# Patient Record
Sex: Female | Born: 1935
Health system: Southern US, Community
[De-identification: ages and names within clinical notes are randomized; demographics above are authoritative.]

## PROBLEM LIST (undated history)

## (undated) DIAGNOSIS — M48061 Spinal stenosis, lumbar region without neurogenic claudication: Secondary | ICD-10-CM

## (undated) DIAGNOSIS — M8580 Other specified disorders of bone density and structure, unspecified site: Secondary | ICD-10-CM

## (undated) DIAGNOSIS — Z8781 Personal history of (healed) traumatic fracture: Secondary | ICD-10-CM

## (undated) DIAGNOSIS — S022XXA Fracture of nasal bones, initial encounter for closed fracture: Secondary | ICD-10-CM

## (undated) DIAGNOSIS — I10 Essential (primary) hypertension: Secondary | ICD-10-CM

## (undated) DIAGNOSIS — G5601 Carpal tunnel syndrome, right upper limb: Secondary | ICD-10-CM

## (undated) DIAGNOSIS — E039 Hypothyroidism, unspecified: Secondary | ICD-10-CM

## (undated) DIAGNOSIS — J3089 Other allergic rhinitis: Secondary | ICD-10-CM

## (undated) DIAGNOSIS — R350 Frequency of micturition: Secondary | ICD-10-CM

## (undated) DIAGNOSIS — H9190 Unspecified hearing loss, unspecified ear: Secondary | ICD-10-CM

## (undated) DIAGNOSIS — S0003XA Contusion of scalp, initial encounter: Secondary | ICD-10-CM

## (undated) DIAGNOSIS — S32000A Wedge compression fracture of unspecified lumbar vertebra, initial encounter for closed fracture: Secondary | ICD-10-CM

## (undated) DIAGNOSIS — M1990 Unspecified osteoarthritis, unspecified site: Secondary | ICD-10-CM

## (undated) DIAGNOSIS — M5412 Radiculopathy, cervical region: Secondary | ICD-10-CM

## (undated) DIAGNOSIS — Z8709 Personal history of other diseases of the respiratory system: Secondary | ICD-10-CM

## (undated) DIAGNOSIS — Z8679 Personal history of other diseases of the circulatory system: Secondary | ICD-10-CM

## (undated) HISTORY — PX: COLONOSCOPY: SHX174

---

## 2001-02-07 ENCOUNTER — Encounter: Admission: RE | Admit: 2001-02-07 | Discharge: 2001-02-07 | Payer: Self-pay | Admitting: Family Medicine

## 2001-02-07 ENCOUNTER — Encounter: Payer: Self-pay | Admitting: Family Medicine

## 2002-07-28 ENCOUNTER — Emergency Department (HOSPITAL_COMMUNITY): Admission: EM | Admit: 2002-07-28 | Discharge: 2002-07-29 | Payer: Self-pay | Admitting: *Deleted

## 2003-01-14 ENCOUNTER — Ambulatory Visit (HOSPITAL_COMMUNITY): Admission: RE | Admit: 2003-01-14 | Discharge: 2003-01-14 | Payer: Self-pay | Admitting: Family Medicine

## 2003-01-14 ENCOUNTER — Encounter: Payer: Self-pay | Admitting: Family Medicine

## 2003-03-13 ENCOUNTER — Emergency Department (HOSPITAL_COMMUNITY): Admission: EM | Admit: 2003-03-13 | Discharge: 2003-03-14 | Payer: Self-pay | Admitting: Emergency Medicine

## 2005-03-26 ENCOUNTER — Emergency Department (HOSPITAL_COMMUNITY): Admission: EM | Admit: 2005-03-26 | Discharge: 2005-03-26 | Payer: Self-pay | Admitting: Emergency Medicine

## 2007-09-06 ENCOUNTER — Ambulatory Visit: Payer: Self-pay | Admitting: *Deleted

## 2007-09-06 ENCOUNTER — Encounter (INDEPENDENT_AMBULATORY_CARE_PROVIDER_SITE_OTHER): Payer: Self-pay | Admitting: Family Medicine

## 2007-09-06 ENCOUNTER — Ambulatory Visit (HOSPITAL_COMMUNITY): Admission: RE | Admit: 2007-09-06 | Discharge: 2007-09-06 | Payer: Self-pay | Admitting: Family Medicine

## 2009-03-07 ENCOUNTER — Emergency Department (HOSPITAL_COMMUNITY): Admission: EM | Admit: 2009-03-07 | Discharge: 2009-03-07 | Payer: Self-pay | Admitting: Emergency Medicine

## 2015-01-15 DIAGNOSIS — I10 Essential (primary) hypertension: Secondary | ICD-10-CM | POA: Diagnosis not present

## 2015-01-15 DIAGNOSIS — Z23 Encounter for immunization: Secondary | ICD-10-CM | POA: Diagnosis not present

## 2015-01-15 DIAGNOSIS — J309 Allergic rhinitis, unspecified: Secondary | ICD-10-CM | POA: Diagnosis not present

## 2015-01-15 DIAGNOSIS — E119 Type 2 diabetes mellitus without complications: Secondary | ICD-10-CM | POA: Diagnosis not present

## 2015-01-15 DIAGNOSIS — E039 Hypothyroidism, unspecified: Secondary | ICD-10-CM | POA: Diagnosis not present

## 2015-01-15 DIAGNOSIS — H409 Unspecified glaucoma: Secondary | ICD-10-CM | POA: Diagnosis not present

## 2015-03-05 DIAGNOSIS — H402231 Chronic angle-closure glaucoma, bilateral, mild stage: Secondary | ICD-10-CM | POA: Diagnosis not present

## 2015-03-12 DIAGNOSIS — H402231 Chronic angle-closure glaucoma, bilateral, mild stage: Secondary | ICD-10-CM | POA: Diagnosis not present

## 2015-04-11 ENCOUNTER — Encounter (HOSPITAL_COMMUNITY): Payer: Self-pay

## 2015-04-11 DIAGNOSIS — I10 Essential (primary) hypertension: Secondary | ICD-10-CM | POA: Insufficient documentation

## 2015-04-11 DIAGNOSIS — L237 Allergic contact dermatitis due to plants, except food: Secondary | ICD-10-CM | POA: Insufficient documentation

## 2015-04-11 DIAGNOSIS — Z8639 Personal history of other endocrine, nutritional and metabolic disease: Secondary | ICD-10-CM | POA: Insufficient documentation

## 2015-04-11 DIAGNOSIS — L03114 Cellulitis of left upper limb: Secondary | ICD-10-CM | POA: Insufficient documentation

## 2015-04-11 NOTE — ED Notes (Signed)
Pt was working in yard Friday and now has poison oak or poison ivy on her left elbow, swelling and rednedss noted.

## 2015-04-12 ENCOUNTER — Emergency Department (HOSPITAL_COMMUNITY)
Admission: EM | Admit: 2015-04-12 | Discharge: 2015-04-12 | Disposition: A | Discharge status: Home / Self Care | Payer: Commercial Managed Care - HMO | Attending: Emergency Medicine | Admitting: Emergency Medicine

## 2015-04-12 DIAGNOSIS — L237 Allergic contact dermatitis due to plants, except food: Secondary | ICD-10-CM

## 2015-04-12 DIAGNOSIS — L03114 Cellulitis of left upper limb: Secondary | ICD-10-CM

## 2015-04-12 DIAGNOSIS — Z8639 Personal history of other endocrine, nutritional and metabolic disease: Secondary | ICD-10-CM | POA: Diagnosis not present

## 2015-04-12 DIAGNOSIS — I10 Essential (primary) hypertension: Secondary | ICD-10-CM | POA: Diagnosis not present

## 2015-04-12 HISTORY — DX: Essential (primary) hypertension: I10

## 2015-04-12 MED ORDER — TRIAMCINOLONE ACETONIDE 0.025 % EX OINT: 1.0000 "application " | TOPICAL_OINTMENT | Freq: Two times a day (BID) | CUTANEOUS | Status: DC

## 2015-04-12 MED ORDER — DOXYCYCLINE HYCLATE 100 MG PO CAPS: 100.0000 mg | ORAL_CAPSULE | Freq: Two times a day (BID) | ORAL | Status: DC

## 2015-04-12 MED ORDER — FAMOTIDINE 20 MG PO TABS: 20.0000 mg | ORAL_TABLET | Freq: Two times a day (BID) | ORAL | Status: DC

## 2015-04-12 MED ORDER — DOXYCYCLINE HYCLATE 100 MG PO TABS
100.0000 mg | ORAL_TABLET | Freq: Once | ORAL | Status: AC
  Administered 2015-04-12: 100 mg via ORAL
  Filled 2015-04-12: qty 1

## 2015-04-12 NOTE — Discharge Instructions (Signed)
Poison [employer] ivy is a inflammation of the skin (contact dermatitis) caused by touching the allergens on the leaves of the ivy plant following previous exposure to the plant. The rash usually appears 48 hours after exposure. The rash is usually bumps (papules) or blisters (vesicles) in a linear pattern. Depending on your own sensitivity, the rash may simply cause redness and itching, or it may also progress to blisters which may break open. These must be well cared for to prevent secondary bacterial (germ) infection, followed by scarring. Keep any open areas dry, clean, dressed, and covered with an antibacterial ointment if needed. The eyes may also get puffy. The puffiness is worst in the morning and gets better as the day progresses. This dermatitis usually heals without scarring, within 2 to 3 weeks without treatment. HOME CARE INSTRUCTIONS  Thoroughly wash with soap and water as soon as you have been exposed to poison ivy. You have about one half hour to remove the plant resin before it will cause the rash. This washing will destroy the oil or antigen on the skin that is causing, or will cause, the rash. Be sure to wash under your fingernails as any plant resin there will continue to spread the rash. Do not rub skin vigorously when washing affected area. Poison ivy cannot spread if no oil from the plant remains on your body. A rash that has progressed to weeping sores will not spread the rash unless you have not washed thoroughly. It is also important to wash any clothes you have been wearing as these may carry active allergens. The rash will return if you wear the unwashed clothing, even several days later. Avoidance of the plant in the future is the best measure. Poison ivy plant can be recognized by the number of leaves. Generally, poison ivy has three leaves with flowering branches on a single stem. Diphenhydramine may be purchased over the counter and used as needed for itching. Do not drive with  this medication if it makes you drowsy.Ask your caregiver about medication for children. SEEK MEDICAL CARE IF:  Open sores develop.  Redness spreads beyond area of rash.  You notice purulent (pus-like) discharge.  You have increased pain.  Other signs of infection develop (such as fever). Document Released: 09/15/2000 Document Revised: 12/11/2011 Document Reviewed: 02/26/2009 [facility] Patient Information 2015 [place], [place]. This information is not intended to replace advice given to you by your health care provider. Make sure you discuss any questions you have with your health care provider. Cellulitis Cellulitis is an infection of the skin and the tissue beneath it. The infected area is usually red and tender. Cellulitis occurs most often in the arms and lower legs.  CAUSES  Cellulitis is caused by bacteria that enter the skin through cracks or cuts in the skin. The most common types of bacteria that cause cellulitis are staphylococci and streptococci. SIGNS AND SYMPTOMS   Redness and warmth.  Swelling.  Tenderness or pain.  Fever. DIAGNOSIS  Your health care provider can usually determine what is wrong based on a physical exam. Blood tests may also be done. TREATMENT  Treatment usually involves taking an antibiotic medicine. HOME CARE INSTRUCTIONS   Take your antibiotic medicine as directed by your health care provider. Finish the antibiotic even if you start to feel better.  Keep the infected arm or leg elevated to reduce swelling.  Apply a warm cloth to the affected area up to 4 times per day to relieve pain.  Take medicines  only as directed by your health care provider.  Keep all follow-up visits as directed by your health care provider. SEEK MEDICAL CARE IF:   You notice red streaks coming from the infected area.  Your red area gets larger or turns dark in color.  Your bone or joint underneath the infected area becomes painful after the skin has healed.  Your  infection returns in the same area or another area.  You notice a swollen bump in the infected area.  You develop new symptoms.  You have a fever. SEEK IMMEDIATE MEDICAL CARE IF:   You feel very sleepy.  You develop vomiting or diarrhea.  You have a general ill feeling (malaise) with muscle aches and pains. MAKE SURE YOU:   Understand these instructions.  Will watch your condition.  Will get help right away if you are not doing well or get worse. Document Released: 06/28/2005 Document Revised: 02/02/2014 Document Reviewed: 12/04/2011 [facility] Patient Information 2015 [place], [place]. This information is not intended to replace advice given to you by your health care provider. Make sure you discuss any questions you have with your health care provider.

## 2015-04-12 NOTE — ED Provider Notes (Signed)
CSN: [id number]     Arrival date & time 04/11/15  2352 History   First MD Initiated Contact with Patient 04/12/15 0016     Chief Complaint  Patient presents with  . Poison Ivy     (Consider location/radiation/quality/duration/timing/severity/associated sxs/prior Treatment) HPI Comments: Patient presents to the emergency department with chief complaint of poison ivy to left arm. She states that she was working in the yard on Friday, and came in contact with poison ivy or poison oak. She reports the area was initially itchy, and then she tried putting some over-the-counter creams on it. She states that it has spread slightly. She states that the vesicles are no longer present. She denies any associated fevers, chills, nausea, or vomiting. She denies history of diabetes. There are no aggravating or alleviating factors.  The history is provided by the patient. No language interpreter was used.    Past Medical History  Diagnosis Date  . Hypertension   . Thyroid disease    History reviewed. No pertinent past surgical history. History reviewed. No pertinent family history. History  Substance Use Topics  . Smoking status: Never Smoker   . Smokeless tobacco: Not on file  . Alcohol Use: No   OB History    No data available     Review of Systems  Constitutional: Negative for fever and chills.  Respiratory: Negative for shortness of breath.   Cardiovascular: Negative for chest pain.  Gastrointestinal: Negative for nausea, vomiting, diarrhea and constipation.  Genitourinary: Negative for dysuria.  Skin: Positive for rash.      Allergies  Review of patient's allergies indicates not on file.  Home Medications   Prior to Admission medications   Medication Sig Start Date End Date Taking? Authorizing Provider  doxycycline (VIBRAMYCIN) 100 MG capsule Take 1 capsule (100 mg total) by mouth 2 (two) times daily. 04/12/15   Montine Circle, PA-C  famotidine (PEPCID) 20 MG tablet Take 1  tablet (20 mg total) by mouth 2 (two) times daily. 04/12/15   Montine Circle, PA-C  triamcinolone (KENALOG) 0.025 % ointment Apply 1 application topically 2 (two) times daily. Do not apply to face 04/12/15   Montine Circle, PA-C   BP 174/86 mmHg  Pulse 91  Temp(Src) 97.3 F (36.3 C) (Oral)  Resp 24  Wt 172 lb 6 oz (78.189 kg)  SpO2 97% Physical Exam  Constitutional: She is oriented to person, place, and time. She appears well-developed and well-nourished.  HENT:  Head: Normocephalic and atraumatic.  Eyes: Conjunctivae and EOM are normal. Pupils are equal, round, and reactive to light.  Neck: Normal range of motion. Neck supple.  Cardiovascular: Normal rate and regular rhythm.  Exam reveals no gallop and no friction rub.   No murmur heard. Pulmonary/Chest: Effort normal and breath sounds normal. No respiratory distress. She has no wheezes. She has no rales. She exhibits no tenderness.  Abdominal: Soft. Bowel sounds are normal. She exhibits no distension. There is no tenderness.  Musculoskeletal: Normal range of motion. She exhibits no edema or tenderness.  Neurological: She is alert and oriented to person, place, and time.  Skin: Skin is warm and dry.  Rash of left antecubital fossa, with some surrounding erythema, which could be an overlying cellulitis, no abscess, no induration  Psychiatric: She has a normal mood and affect. Her behavior is normal. Judgment and thought content normal.  Nursing note and vitals reviewed.   ED Course  Procedures (including critical care time) Labs Review Labs Reviewed - No data  to display  Imaging Review No results found.   EKG Interpretation None      MDM   Final diagnoses:  Cellulitis of left arm  Poison ivy    Patient with poison ivy exposure, and possibly overlying cellulitis, will treat with doxycycline, Pepcid, and triamcinolone cream. Recommend primary care follow-up. Return to the emergency department if the redness spreads.  Patient understands and agrees the plan. She is stable and ready for discharge.  Montine Circle, PA-C 04/12/15 0102  Jola Schmidt, MD 04/12/15 [id number]

## 2015-04-12 NOTE — ED Notes (Signed)
Pt reports numbness in R elbow, edema noted to area

## 2015-06-01 DIAGNOSIS — R350 Frequency of micturition: Secondary | ICD-10-CM | POA: Diagnosis not present

## 2015-06-01 DIAGNOSIS — R1032 Left lower quadrant pain: Secondary | ICD-10-CM | POA: Diagnosis not present

## 2015-07-20 DIAGNOSIS — I10 Essential (primary) hypertension: Secondary | ICD-10-CM | POA: Diagnosis not present

## 2015-07-20 DIAGNOSIS — E1165 Type 2 diabetes mellitus with hyperglycemia: Secondary | ICD-10-CM | POA: Diagnosis not present

## 2015-07-20 DIAGNOSIS — H409 Unspecified glaucoma: Secondary | ICD-10-CM | POA: Diagnosis not present

## 2015-07-20 DIAGNOSIS — Z1389 Encounter for screening for other disorder: Secondary | ICD-10-CM | POA: Diagnosis not present

## 2015-07-20 DIAGNOSIS — E119 Type 2 diabetes mellitus without complications: Secondary | ICD-10-CM | POA: Diagnosis not present

## 2015-07-20 DIAGNOSIS — E039 Hypothyroidism, unspecified: Secondary | ICD-10-CM | POA: Diagnosis not present

## 2015-07-20 DIAGNOSIS — J309 Allergic rhinitis, unspecified: Secondary | ICD-10-CM | POA: Diagnosis not present

## 2015-09-03 DIAGNOSIS — H402221 Chronic angle-closure glaucoma, left eye, mild stage: Secondary | ICD-10-CM | POA: Diagnosis not present

## 2015-09-03 DIAGNOSIS — H402211 Chronic angle-closure glaucoma, right eye, mild stage: Secondary | ICD-10-CM | POA: Diagnosis not present

## 2015-09-07 DIAGNOSIS — R05 Cough: Secondary | ICD-10-CM | POA: Diagnosis not present

## 2015-09-07 DIAGNOSIS — B354 Tinea corporis: Secondary | ICD-10-CM | POA: Diagnosis not present

## 2016-01-19 DIAGNOSIS — I10 Essential (primary) hypertension: Secondary | ICD-10-CM | POA: Diagnosis not present

## 2016-01-19 DIAGNOSIS — E039 Hypothyroidism, unspecified: Secondary | ICD-10-CM | POA: Diagnosis not present

## 2016-01-19 DIAGNOSIS — K5900 Constipation, unspecified: Secondary | ICD-10-CM | POA: Diagnosis not present

## 2016-01-19 DIAGNOSIS — H409 Unspecified glaucoma: Secondary | ICD-10-CM | POA: Diagnosis not present

## 2016-01-19 DIAGNOSIS — E119 Type 2 diabetes mellitus without complications: Secondary | ICD-10-CM | POA: Diagnosis not present

## 2016-01-19 DIAGNOSIS — J309 Allergic rhinitis, unspecified: Secondary | ICD-10-CM | POA: Diagnosis not present

## 2016-03-03 DIAGNOSIS — Z0100 Encounter for examination of eyes and vision without abnormal findings: Secondary | ICD-10-CM | POA: Diagnosis not present

## 2016-03-03 DIAGNOSIS — H43811 Vitreous degeneration, right eye: Secondary | ICD-10-CM | POA: Diagnosis not present

## 2016-03-03 DIAGNOSIS — H402211 Chronic angle-closure glaucoma, right eye, mild stage: Secondary | ICD-10-CM | POA: Diagnosis not present

## 2016-03-03 DIAGNOSIS — E119 Type 2 diabetes mellitus without complications: Secondary | ICD-10-CM | POA: Diagnosis not present

## 2016-07-21 DIAGNOSIS — H409 Unspecified glaucoma: Secondary | ICD-10-CM | POA: Diagnosis not present

## 2016-07-21 DIAGNOSIS — R350 Frequency of micturition: Secondary | ICD-10-CM | POA: Diagnosis not present

## 2016-07-21 DIAGNOSIS — Z1389 Encounter for screening for other disorder: Secondary | ICD-10-CM | POA: Diagnosis not present

## 2016-07-21 DIAGNOSIS — J309 Allergic rhinitis, unspecified: Secondary | ICD-10-CM | POA: Diagnosis not present

## 2016-07-21 DIAGNOSIS — I10 Essential (primary) hypertension: Secondary | ICD-10-CM | POA: Diagnosis not present

## 2016-07-21 DIAGNOSIS — K5900 Constipation, unspecified: Secondary | ICD-10-CM | POA: Diagnosis not present

## 2016-07-21 DIAGNOSIS — E119 Type 2 diabetes mellitus without complications: Secondary | ICD-10-CM | POA: Diagnosis not present

## 2016-07-21 DIAGNOSIS — E039 Hypothyroidism, unspecified: Secondary | ICD-10-CM | POA: Diagnosis not present

## 2016-07-26 DIAGNOSIS — H9202 Otalgia, left ear: Secondary | ICD-10-CM | POA: Diagnosis not present

## 2016-08-20 DIAGNOSIS — J0100 Acute maxillary sinusitis, unspecified: Secondary | ICD-10-CM | POA: Diagnosis not present

## 2016-08-20 DIAGNOSIS — M26602 Left temporomandibular joint disorder, unspecified: Secondary | ICD-10-CM | POA: Diagnosis not present

## 2016-08-20 DIAGNOSIS — J309 Allergic rhinitis, unspecified: Secondary | ICD-10-CM | POA: Diagnosis not present

## 2016-09-08 DIAGNOSIS — H401111 Primary open-angle glaucoma, right eye, mild stage: Secondary | ICD-10-CM | POA: Diagnosis not present

## 2016-09-08 DIAGNOSIS — H401122 Primary open-angle glaucoma, left eye, moderate stage: Secondary | ICD-10-CM | POA: Diagnosis not present

## 2016-09-13 DIAGNOSIS — N3941 Urge incontinence: Secondary | ICD-10-CM | POA: Diagnosis not present

## 2016-09-13 DIAGNOSIS — N952 Postmenopausal atrophic vaginitis: Secondary | ICD-10-CM | POA: Diagnosis not present

## 2016-09-13 DIAGNOSIS — R350 Frequency of micturition: Secondary | ICD-10-CM | POA: Diagnosis not present

## 2016-10-05 ENCOUNTER — Observation Stay (HOSPITAL_COMMUNITY)
Admission: EM | Admit: 2016-10-05 | Discharge: 2016-10-07 | Disposition: A | Discharge status: Home / Self Care | Payer: Medicare HMO | Attending: General Surgery | Admitting: General Surgery

## 2016-10-05 ENCOUNTER — Emergency Department (HOSPITAL_COMMUNITY): Payer: Medicare HMO

## 2016-10-05 ENCOUNTER — Encounter (HOSPITAL_COMMUNITY): Payer: Self-pay | Admitting: Emergency Medicine

## 2016-10-05 DIAGNOSIS — S42221A 2-part displaced fracture of surgical neck of right humerus, initial encounter for closed fracture: Secondary | ICD-10-CM | POA: Insufficient documentation

## 2016-10-05 DIAGNOSIS — Y9301 Activity, walking, marching and hiking: Secondary | ICD-10-CM | POA: Diagnosis not present

## 2016-10-05 DIAGNOSIS — S065X0A Traumatic subdural hemorrhage without loss of consciousness, initial encounter: Secondary | ICD-10-CM | POA: Diagnosis not present

## 2016-10-05 DIAGNOSIS — R2681 Unsteadiness on feet: Secondary | ICD-10-CM

## 2016-10-05 DIAGNOSIS — I10 Essential (primary) hypertension: Secondary | ICD-10-CM | POA: Diagnosis not present

## 2016-10-05 DIAGNOSIS — S022XXA Fracture of nasal bones, initial encounter for closed fracture: Secondary | ICD-10-CM | POA: Diagnosis not present

## 2016-10-05 DIAGNOSIS — R402413 Glasgow coma scale score 13-15, at hospital admission: Secondary | ICD-10-CM | POA: Diagnosis not present

## 2016-10-05 DIAGNOSIS — S42209A Unspecified fracture of upper end of unspecified humerus, initial encounter for closed fracture: Secondary | ICD-10-CM

## 2016-10-05 DIAGNOSIS — S199XXA Unspecified injury of neck, initial encounter: Secondary | ICD-10-CM | POA: Diagnosis not present

## 2016-10-05 DIAGNOSIS — Z7982 Long term (current) use of aspirin: Secondary | ICD-10-CM | POA: Insufficient documentation

## 2016-10-05 DIAGNOSIS — W101XXA Fall (on)(from) sidewalk curb, initial encounter: Secondary | ICD-10-CM | POA: Insufficient documentation

## 2016-10-05 DIAGNOSIS — Z79899 Other long term (current) drug therapy: Secondary | ICD-10-CM | POA: Insufficient documentation

## 2016-10-05 DIAGNOSIS — S0181XA Laceration without foreign body of other part of head, initial encounter: Secondary | ICD-10-CM | POA: Diagnosis not present

## 2016-10-05 DIAGNOSIS — S42201A Unspecified fracture of upper end of right humerus, initial encounter for closed fracture: Secondary | ICD-10-CM | POA: Diagnosis not present

## 2016-10-05 DIAGNOSIS — S065X9A Traumatic subdural hemorrhage with loss of consciousness of unspecified duration, initial encounter: Secondary | ICD-10-CM

## 2016-10-05 DIAGNOSIS — S065XAA Traumatic subdural hemorrhage with loss of consciousness status unknown, initial encounter: Secondary | ICD-10-CM

## 2016-10-05 LAB — CBC WITH DIFFERENTIAL/PLATELET
Basophils Absolute: 0 10*3/uL (ref 0.0–0.1)
Basophils Relative: 0 %
EOS PCT: 0 %
Eosinophils Absolute: 0 10*3/uL (ref 0.0–0.7)
HEMATOCRIT: 42.8 % (ref 36.0–46.0)
HEMOGLOBIN: 14.4 g/dL (ref 12.0–15.0)
LYMPHS ABS: 1.4 10*3/uL (ref 0.7–4.0)
LYMPHS PCT: 10 %
MCH: 29.9 pg (ref 26.0–34.0)
MCHC: 33.6 g/dL (ref 30.0–36.0)
MCV: 89 fL (ref 78.0–100.0)
MONOS PCT: 4 %
Monocytes Absolute: 0.5 10*3/uL (ref 0.1–1.0)
NEUTROS PCT: 86 %
Neutro Abs: 12.7 10*3/uL — ABNORMAL HIGH (ref 1.7–7.7)
PLATELETS: 237 10*3/uL (ref 150–400)
RBC: 4.81 MIL/uL (ref 3.87–5.11)
RDW: 12.6 % (ref 11.5–15.5)
WBC: 14.7 10*3/uL — AB (ref 4.0–10.5)

## 2016-10-05 LAB — BASIC METABOLIC PANEL
Anion gap: 10 (ref 5–15)
BUN: 9 mg/dL (ref 6–20)
CHLORIDE: 104 mmol/L (ref 101–111)
CO2: 22 mmol/L (ref 22–32)
Calcium: 9.3 mg/dL (ref 8.9–10.3)
Creatinine, Ser: 0.51 mg/dL (ref 0.44–1.00)
GFR calc Af Amer: >60 mL/min (ref >60)
GFR calc non Af Amer: >60 mL/min (ref >60)
Glucose, Bld: 143 mg/dL — ABNORMAL HIGH (ref 65–99)
POTASSIUM: 3.8 mmol/L (ref 3.5–5.1)
SODIUM: 136 mmol/L (ref 135–145)

## 2016-10-05 LAB — PROTIME-INR
INR: 1.1
PROTHROMBIN TIME: 14.2 s (ref 11.4–15.2)

## 2016-10-05 MED ORDER — ONDANSETRON HCL 4 MG/2ML IJ SOLN: 4.0000 mg | Freq: Four times a day (QID) | INTRAMUSCULAR | Status: DC | PRN

## 2016-10-05 MED ORDER — ACETAMINOPHEN 325 MG PO TABS
650.0000 mg | ORAL_TABLET | Freq: Four times a day (QID) | ORAL | Status: DC | PRN
  Administered 2016-10-05 – 2016-10-07 (×5): 650 mg via ORAL
  Filled 2016-10-05 (×5): qty 2

## 2016-10-05 MED ORDER — ONDANSETRON HCL 4 MG PO TABS: 4.0000 mg | ORAL_TABLET | Freq: Four times a day (QID) | ORAL | Status: DC | PRN

## 2016-10-05 MED ORDER — MORPHINE SULFATE (PF) 4 MG/ML IV SOLN: 1.0000 mg | INTRAVENOUS | Status: DC | PRN

## 2016-10-05 MED ORDER — PANTOPRAZOLE SODIUM 40 MG PO TBEC
40.0000 mg | DELAYED_RELEASE_TABLET | Freq: Every day | ORAL | Status: DC
  Administered 2016-10-05 – 2016-10-07 (×3): 40 mg via ORAL
  Filled 2016-10-05 (×3): qty 1

## 2016-10-05 MED ORDER — SODIUM CHLORIDE 0.9 % IV SOLN
INTRAVENOUS | Status: DC
  Administered 2016-10-05: 22:00:00 via INTRAVENOUS

## 2016-10-05 MED ORDER — HYDROCODONE-ACETAMINOPHEN 5-325 MG PO TABS: 1.0000 | ORAL_TABLET | ORAL | Status: DC | PRN

## 2016-10-05 MED ORDER — PANTOPRAZOLE SODIUM 40 MG IV SOLR
40.0000 mg | Freq: Every day | INTRAVENOUS | Status: DC
  Filled 2016-10-05: qty 40

## 2016-10-05 NOTE — ED Notes (Signed)
ED Provider at bedside. 

## 2016-10-05 NOTE — ED Notes (Signed)
Forehead lacerations cleaned with saline and hydrogen peroxide

## 2016-10-05 NOTE — H&P (Signed)
History   Linda Copeland is an 83 y.o. female.   Chief Complaint:  Chief Complaint  Patient presents with  . Fall    Fall  This is a new (Just today.) problem. The current episode started today. The problem occurs rarely. The problem has been unchanged. Associated symptoms include headaches. Associated symptoms comments: Nosebleed. The symptoms are aggravated by coughing and sneezing. She has tried position changes for the symptoms.  Trauma Mechanism of injury: fall Injury location: face and head/neck Injury location detail: head and nose and forehead Incident location: home Time since incident: 5 hours Arrived directly from scene: yes   Fall:      Fall occurred: tripped and walking      Height of fall: ground level       Impact surface: concrete      Point of impact: head (shoulder)      Entrapped after fall: no  Protective equipment:       None      Suspicion of alcohol use: no      Suspicion of drug use: no  EMS/PTA data:      Bystander interventions: helped teh patient stand.      Ambulatory at scene: yes      Blood loss: minimal      Responsiveness: alert      Oriented to: person, place, situation and time      Loss of consciousness: no      Amnesic to event: no      Airway interventions: none      Breathing interventions: oxygen      IV access: established      IO access: none      Fluids administered: normal saline      Cardiac interventions: none      Medications administered: none      Immobilization: none      Airway condition since incident: improving      Breathing condition since incident: improving      Circulation condition since incident: improving      Mental status condition since incident: stable      Disability condition since incident: stable  Current symptoms:      Pain scale: 4/10      Pain quality: aching      Pain timing: constant      Associated symptoms:            Reports headache.            Denies loss of consciousness.    Relevant PMH:      Tetanus status: unknown      The patient has not been admitted to the hospital due to injury in the past year, and has not been treated and released from the ED due to injury in the past year.   Past Medical History:  Diagnosis Date  . Hypertension   . Thyroid disease     History reviewed. No pertinent surgical history.  History reviewed. No pertinent family history. Social History:  reports that she has never smoked. She does not have any smokeless tobacco history on file. She reports that she does not drink alcohol or use drugs.  Allergies   Allergies  Allergen Reactions  . Codeine Nausea And Vomiting    Home Medications   (Not in a hospital admission)  Trauma Course   Results for orders placed or performed during the hospital encounter of 10/05/16 (from the past 48 hour(s))  Basic metabolic panel  Status: Abnormal   Collection Time: 10/05/16  7:10 PM  Result Value Ref Range   Sodium 136 135 - 145 mmol/L   Potassium 3.8 3.5 - 5.1 mmol/L   Chloride 104 101 - 111 mmol/L   CO2 22 22 - 32 mmol/L   Glucose, Bld 143 (H) 65 - 99 mg/dL   BUN 9 6 - 20 mg/dL   Creatinine, Ser 0.51 0.44 - 1.00 mg/dL   Calcium 9.3 8.9 - 10.3 mg/dL   GFR calc non Af Amer >60 >60 mL/min   GFR calc Af Amer >60 >60 mL/min    Comment: (NOTE) The eGFR has been calculated using the CKD EPI equation. This calculation has not been validated in all clinical situations. eGFR's persistently <60 mL/min signify possible Chronic Kidney Disease.    Anion gap 10 5 - 15  CBC with Differential     Status: Abnormal   Collection Time: 10/05/16  7:10 PM  Result Value Ref Range   WBC 14.7 (H) 4.0 - 10.5 K/uL   RBC 4.81 3.87 - 5.11 MIL/uL   Hemoglobin 14.4 12.0 - 15.0 g/dL   HCT 42.8 36.0 - 46.0 %   MCV 89.0 78.0 - 100.0 fL   MCH 29.9 26.0 - 34.0 pg   MCHC 33.6 30.0 - 36.0 g/dL   RDW 12.6 11.5 - 15.5 %   Platelets 237 150 - 400 K/uL   Neutrophils Relative % 86 %   Neutro Abs  12.7 (H) 1.7 - 7.7 K/uL   Lymphocytes Relative 10 %   Lymphs Abs 1.4 0.7 - 4.0 K/uL   Monocytes Relative 4 %   Monocytes Absolute 0.5 0.1 - 1.0 K/uL   Eosinophils Relative 0 %   Eosinophils Absolute 0.0 0.0 - 0.7 K/uL   Basophils Relative 0 %   Basophils Absolute 0.0 0.0 - 0.1 K/uL  Protime-INR     Status: None   Collection Time: 10/05/16  7:10 PM  Result Value Ref Range   Prothrombin Time 14.2 11.4 - 15.2 seconds   INR 1.10    Dg Shoulder Right  Result Date: 10/05/2016 CLINICAL DATA:  Right shoulder pain and deformity after fall. Is fallen are EXAM: RIGHT SHOULDER - 2+ VIEW COMPARISON:  None. FINDINGS: There is an acute, closed, transverse fracture through the surgical neck of the humerus. Equivocal greater tuberosity fracture suggested on the AP view of the shoulder not confirmed however on the scapular Y-views. No significant angulation or displacement. The scapula, AC joint, visualized right ribs and lung are unremarkable. No malalignment of the glenohumeral joint. IMPRESSION: Neer category 1 part fracture of the surgical neck of the right humerus without significant angulation nor displacement. Equivocal greater tuberosity fracture is also suggested on the AP view but not confirmed on the lateral projections. Electronically Signed   By: [name] M.D.   On: 10/05/2016 18:35   Ct Head Wo Contrast  Addendum Date: 10/05/2016   ADDENDUM REPORT: 10/05/2016 18:47 ADDENDUM: Critical Value/emergent results were called by telephone at the time of interpretation on 10/05/2016 at 1830 hours to Dr. Theotis Burrow , who verbally acknowledged these results. Electronically Signed   By: [name] M.D.   On: 10/05/2016 18:47   Result Date: 10/05/2016 CLINICAL DATA:  83 year old female status post trip and fall on rough concrete outside this afternoon. Face and nose laceration. Ecchymoses. Initial encounter. EXAM: CT HEAD WITHOUT CONTRAST CT MAXILLOFACIAL WITHOUT CONTRAST CT CERVICAL SPINE WITHOUT CONTRAST  TECHNIQUE: Multidetector CT imaging of the head, cervical  spine, and maxillofacial structures were performed using the standard protocol without intravenous contrast. Multiplanar CT image reconstructions of the cervical spine and maxillofacial structures were also generated. COMPARISON:  Cervical spine radiographs 03/26/2005. FINDINGS: CT HEAD FINDINGS Brain: Subdural hematoma along the interhemispheric fissure measuring up to 15 mm in thickness. This is associated with globular left superior parasagittal hyperdensity measuring 10-12 mm is over the left superior frontal gyrus as seen on series 203, image 35. Mild regional mass effect. No definite cerebral edema. Subdural hematoma tracks along the left tentorium measuring up to 2-3 mm in thickness there. There is also trace left lateral convexity subdural hematoma most visible at the operculum and measuring 2-3 mm. No peripheral right side subdural. No other extra-axial hemorrhage identified. No midline shift. No ventriculomegaly. No cortically based acute infarct identified. Vascular: Dominant distal left vertebral artery. Calcified atherosclerosis at the skull base. Skull: Hyperostosis of the calvarium (normal variant). No calvarium fracture identified. Facial fractures described below. Other: Anterior scalp and forehead superficial hematoma and subcutaneous gas in keeping with lacerations. The posterior scalp soft tissues appear normal. See facial findings below. CT MAXILLOFACIAL FINDINGS Osseous: Mandible intact.  No maxilla or zygoma fracture. Bilateral nasal bone fractures, mostly nondisplaced on the left and comminuted and mildly displaced on the right (series 302, image 37). There is also leftward deviation and angulation of the anterior bony nasal septum which could be acute. No central skullbase fracture. Orbits: No orbital wall fracture. Globes are intact. Post cataract surgery changes. No intraorbital hematoma or contusion. Sinuses: Mild bubbly opacity in  the right sphenoid sinus. The other paranasal sinuses are clear. There is a small volume of blood or retained secretions in the nasal cavity. Tympanic cavities and mastoids are clear. Soft tissues: Soft tissue swelling overlying the knee nose. Broad-based forehead hematoma measuring up to 6 mm in thickness. Superimposed subcutaneous gas in keeping with associated lacerations. The lateral and more inferior face superficial soft tissues appear normal. Small volume retained secretions in the oropharynx. The adenoid soft tissue may be surgically absent. There is bulky lingual tonsillar hypertrophy. There does appear to be bilateral palatine tonsillar tissue present, with postinflammatory calcifications on the left. Negative visualized parapharyngeal, retropharyngeal and sublingual spaces. The submandibular glands appear to be atrophied. Negative parotid glands. Coarsely calcified right level 2/level 3 lymph node. No superior neck lymphadenopathy. CT CERVICAL SPINE FINDINGS Alignment: Exaggerated upper cervical lordosis and straightening of lower cervical lordosis. Trace anterolisthesis of C6 on C7, and similar anterolisthesis of C7 on T1. These appear degenerative. There is ankylosis of the C5-C6 level which may be congenitally incompletely segmented. Bilateral posterior element alignment is within normal limits. Skull base and vertebrae: Degenerative changes at the anterior C1 odontoid and also the left lateral C1-C2 and occipital condyles articulations. Bulky ligamentous hypertrophy about the odontoid. Visualized skull base is intact. No atlanto-occipital dissociation. No acute cervical spine fracture identified. Soft tissues and spinal canal: No prevertebral fluid or swelling. No visible canal hematoma. Postinflammatory appearing densely calcified right neck lymph node. There is bilateral level 2 and/or level 3 lymph node enlargement up to 10-11 mm mm short axis. The bulky lingual and to a lesser extent palatine  tonsillar enlargement is again noted. No lower cervical lymphadenopathy. Negative retropharyngeal space. Incidental retropharyngeal course of both common carotid arteries. Negative larynx. Diminutive thyroid. Bilateral carotid artery bifurcation. Disc levels: Possible mild degenerative cervical spinal stenosis at C4-C5. Widespread facet arthropathy. Upper chest: Mild respiratory motion artifact but negative visualized lung apices. Calcified aortic atherosclerosis.  Visualized upper thoracic levels appear grossly intact. IMPRESSION: 1. Positive for acute intracranial hemorrhage. Left side predominantly parafalcine subdural hematoma measuring up to 15 in thickness along the interhemispheric fissure. Left tentorial and lateral subdural component measuring 2-3 mm in thickness. No significant intracranial mass effect or midline shift at this time. 2. No other acute traumatic injury to the brain identified. 3. Right greater than left nasal bone fractures. Possible associated anterior nasal septal fracture. Overlying soft tissue swelling. 4. Anterior forehead and scalp soft tissue injury without underlying fracture. 5. No acute fracture or listhesis identified in the cervical spine. Advanced skullbase and cervical spine degenerative changes. 6. Nonspecific mild to moderate bilateral level 2/3 cervical lymphadenopathy in association with bulky enlargement of the lingual tonsil. Mild asymmetry of the palatine tonsils. Recommend outpatient follow-up neck CT with IV contrast to evaluate further. 7.  Calcified aortic atherosclerosis. Electronically Signed: By: [name] M.D. On: 10/05/2016 18:28   Ct Cervical Spine Wo Contrast  Addendum Date: 10/05/2016   ADDENDUM REPORT: 10/05/2016 18:47 ADDENDUM: Critical Value/emergent results were called by telephone at the time of interpretation on 10/05/2016 at 1830 hours to Dr. Theotis Burrow , who verbally acknowledged these results. Electronically Signed   By: [name] M.D.   On:  10/05/2016 18:47   Result Date: 10/05/2016 CLINICAL DATA:  83 year old female status post trip and fall on rough concrete outside this afternoon. Face and nose laceration. Ecchymoses. Initial encounter. EXAM: CT HEAD WITHOUT CONTRAST CT MAXILLOFACIAL WITHOUT CONTRAST CT CERVICAL SPINE WITHOUT CONTRAST TECHNIQUE: Multidetector CT imaging of the head, cervical spine, and maxillofacial structures were performed using the standard protocol without intravenous contrast. Multiplanar CT image reconstructions of the cervical spine and maxillofacial structures were also generated. COMPARISON:  Cervical spine radiographs 03/26/2005. FINDINGS: CT HEAD FINDINGS Brain: Subdural hematoma along the interhemispheric fissure measuring up to 15 mm in thickness. This is associated with globular left superior parasagittal hyperdensity measuring 10-12 mm is over the left superior frontal gyrus as seen on series 203, image 35. Mild regional mass effect. No definite cerebral edema. Subdural hematoma tracks along the left tentorium measuring up to 2-3 mm in thickness there. There is also trace left lateral convexity subdural hematoma most visible at the operculum and measuring 2-3 mm. No peripheral right side subdural. No other extra-axial hemorrhage identified. No midline shift. No ventriculomegaly. No cortically based acute infarct identified. Vascular: Dominant distal left vertebral artery. Calcified atherosclerosis at the skull base. Skull: Hyperostosis of the calvarium (normal variant). No calvarium fracture identified. Facial fractures described below. Other: Anterior scalp and forehead superficial hematoma and subcutaneous gas in keeping with lacerations. The posterior scalp soft tissues appear normal. See facial findings below. CT MAXILLOFACIAL FINDINGS Osseous: Mandible intact.  No maxilla or zygoma fracture. Bilateral nasal bone fractures, mostly nondisplaced on the left and comminuted and mildly displaced on the right (series  302, image 37). There is also leftward deviation and angulation of the anterior bony nasal septum which could be acute. No central skullbase fracture. Orbits: No orbital wall fracture. Globes are intact. Post cataract surgery changes. No intraorbital hematoma or contusion. Sinuses: Mild bubbly opacity in the right sphenoid sinus. The other paranasal sinuses are clear. There is a small volume of blood or retained secretions in the nasal cavity. Tympanic cavities and mastoids are clear. Soft tissues: Soft tissue swelling overlying the knee nose. Broad-based forehead hematoma measuring up to 6 mm in thickness. Superimposed subcutaneous gas in keeping with associated lacerations. The lateral and more inferior  face superficial soft tissues appear normal. Small volume retained secretions in the oropharynx. The adenoid soft tissue may be surgically absent. There is bulky lingual tonsillar hypertrophy. There does appear to be bilateral palatine tonsillar tissue present, with postinflammatory calcifications on the left. Negative visualized parapharyngeal, retropharyngeal and sublingual spaces. The submandibular glands appear to be atrophied. Negative parotid glands. Coarsely calcified right level 2/level 3 lymph node. No superior neck lymphadenopathy. CT CERVICAL SPINE FINDINGS Alignment: Exaggerated upper cervical lordosis and straightening of lower cervical lordosis. Trace anterolisthesis of C6 on C7, and similar anterolisthesis of C7 on T1. These appear degenerative. There is ankylosis of the C5-C6 level which may be congenitally incompletely segmented. Bilateral posterior element alignment is within normal limits. Skull base and vertebrae: Degenerative changes at the anterior C1 odontoid and also the left lateral C1-C2 and occipital condyles articulations. Bulky ligamentous hypertrophy about the odontoid. Visualized skull base is intact. No atlanto-occipital dissociation. No acute cervical spine fracture identified. Soft  tissues and spinal canal: No prevertebral fluid or swelling. No visible canal hematoma. Postinflammatory appearing densely calcified right neck lymph node. There is bilateral level 2 and/or level 3 lymph node enlargement up to 10-11 mm mm short axis. The bulky lingual and to a lesser extent palatine tonsillar enlargement is again noted. No lower cervical lymphadenopathy. Negative retropharyngeal space. Incidental retropharyngeal course of both common carotid arteries. Negative larynx. Diminutive thyroid. Bilateral carotid artery bifurcation. Disc levels: Possible mild degenerative cervical spinal stenosis at C4-C5. Widespread facet arthropathy. Upper chest: Mild respiratory motion artifact but negative visualized lung apices. Calcified aortic atherosclerosis. Visualized upper thoracic levels appear grossly intact. IMPRESSION: 1. Positive for acute intracranial hemorrhage. Left side predominantly parafalcine subdural hematoma measuring up to 15 in thickness along the interhemispheric fissure. Left tentorial and lateral subdural component measuring 2-3 mm in thickness. No significant intracranial mass effect or midline shift at this time. 2. No other acute traumatic injury to the brain identified. 3. Right greater than left nasal bone fractures. Possible associated anterior nasal septal fracture. Overlying soft tissue swelling. 4. Anterior forehead and scalp soft tissue injury without underlying fracture. 5. No acute fracture or listhesis identified in the cervical spine. Advanced skullbase and cervical spine degenerative changes. 6. Nonspecific mild to moderate bilateral level 2/3 cervical lymphadenopathy in association with bulky enlargement of the lingual tonsil. Mild asymmetry of the palatine tonsils. Recommend outpatient follow-up neck CT with IV contrast to evaluate further. 7.  Calcified aortic atherosclerosis. Electronically Signed: By: [name] M.D. On: 10/05/2016 18:28   Ct Maxillofacial Wo Cm  Addendum  Date: 10/05/2016   ADDENDUM REPORT: 10/05/2016 18:47 ADDENDUM: Critical Value/emergent results were called by telephone at the time of interpretation on 10/05/2016 at 1830 hours to Dr. Theotis Burrow , who verbally acknowledged these results. Electronically Signed   By: [name] M.D.   On: 10/05/2016 18:47   Result Date: 10/05/2016 CLINICAL DATA:  83 year old female status post trip and fall on rough concrete outside this afternoon. Face and nose laceration. Ecchymoses. Initial encounter. EXAM: CT HEAD WITHOUT CONTRAST CT MAXILLOFACIAL WITHOUT CONTRAST CT CERVICAL SPINE WITHOUT CONTRAST TECHNIQUE: Multidetector CT imaging of the head, cervical spine, and maxillofacial structures were performed using the standard protocol without intravenous contrast. Multiplanar CT image reconstructions of the cervical spine and maxillofacial structures were also generated. COMPARISON:  Cervical spine radiographs 03/26/2005. FINDINGS: CT HEAD FINDINGS Brain: Subdural hematoma along the interhemispheric fissure measuring up to 15 mm in thickness. This is associated with globular left superior parasagittal hyperdensity measuring  10-12 mm is over the left superior frontal gyrus as seen on series 203, image 35. Mild regional mass effect. No definite cerebral edema. Subdural hematoma tracks along the left tentorium measuring up to 2-3 mm in thickness there. There is also trace left lateral convexity subdural hematoma most visible at the operculum and measuring 2-3 mm. No peripheral right side subdural. No other extra-axial hemorrhage identified. No midline shift. No ventriculomegaly. No cortically based acute infarct identified. Vascular: Dominant distal left vertebral artery. Calcified atherosclerosis at the skull base. Skull: Hyperostosis of the calvarium (normal variant). No calvarium fracture identified. Facial fractures described below. Other: Anterior scalp and forehead superficial hematoma and subcutaneous gas in keeping with  lacerations. The posterior scalp soft tissues appear normal. See facial findings below. CT MAXILLOFACIAL FINDINGS Osseous: Mandible intact.  No maxilla or zygoma fracture. Bilateral nasal bone fractures, mostly nondisplaced on the left and comminuted and mildly displaced on the right (series 302, image 37). There is also leftward deviation and angulation of the anterior bony nasal septum which could be acute. No central skullbase fracture. Orbits: No orbital wall fracture. Globes are intact. Post cataract surgery changes. No intraorbital hematoma or contusion. Sinuses: Mild bubbly opacity in the right sphenoid sinus. The other paranasal sinuses are clear. There is a small volume of blood or retained secretions in the nasal cavity. Tympanic cavities and mastoids are clear. Soft tissues: Soft tissue swelling overlying the knee nose. Broad-based forehead hematoma measuring up to 6 mm in thickness. Superimposed subcutaneous gas in keeping with associated lacerations. The lateral and more inferior face superficial soft tissues appear normal. Small volume retained secretions in the oropharynx. The adenoid soft tissue may be surgically absent. There is bulky lingual tonsillar hypertrophy. There does appear to be bilateral palatine tonsillar tissue present, with postinflammatory calcifications on the left. Negative visualized parapharyngeal, retropharyngeal and sublingual spaces. The submandibular glands appear to be atrophied. Negative parotid glands. Coarsely calcified right level 2/level 3 lymph node. No superior neck lymphadenopathy. CT CERVICAL SPINE FINDINGS Alignment: Exaggerated upper cervical lordosis and straightening of lower cervical lordosis. Trace anterolisthesis of C6 on C7, and similar anterolisthesis of C7 on T1. These appear degenerative. There is ankylosis of the C5-C6 level which may be congenitally incompletely segmented. Bilateral posterior element alignment is within normal limits. Skull base and  vertebrae: Degenerative changes at the anterior C1 odontoid and also the left lateral C1-C2 and occipital condyles articulations. Bulky ligamentous hypertrophy about the odontoid. Visualized skull base is intact. No atlanto-occipital dissociation. No acute cervical spine fracture identified. Soft tissues and spinal canal: No prevertebral fluid or swelling. No visible canal hematoma. Postinflammatory appearing densely calcified right neck lymph node. There is bilateral level 2 and/or level 3 lymph node enlargement up to 10-11 mm mm short axis. The bulky lingual and to a lesser extent palatine tonsillar enlargement is again noted. No lower cervical lymphadenopathy. Negative retropharyngeal space. Incidental retropharyngeal course of both common carotid arteries. Negative larynx. Diminutive thyroid. Bilateral carotid artery bifurcation. Disc levels: Possible mild degenerative cervical spinal stenosis at C4-C5. Widespread facet arthropathy. Upper chest: Mild respiratory motion artifact but negative visualized lung apices. Calcified aortic atherosclerosis. Visualized upper thoracic levels appear grossly intact. IMPRESSION: 1. Positive for acute intracranial hemorrhage. Left side predominantly parafalcine subdural hematoma measuring up to 15 in thickness along the interhemispheric fissure. Left tentorial and lateral subdural component measuring 2-3 mm in thickness. No significant intracranial mass effect or midline shift at this time. 2. No other acute traumatic injury to the brain  identified. 3. Right greater than left nasal bone fractures. Possible associated anterior nasal septal fracture. Overlying soft tissue swelling. 4. Anterior forehead and scalp soft tissue injury without underlying fracture. 5. No acute fracture or listhesis identified in the cervical spine. Advanced skullbase and cervical spine degenerative changes. 6. Nonspecific mild to moderate bilateral level 2/3 cervical lymphadenopathy in association with  bulky enlargement of the lingual tonsil. Mild asymmetry of the palatine tonsils. Recommend outpatient follow-up neck CT with IV contrast to evaluate further. 7.  Calcified aortic atherosclerosis. Electronically Signed: By: [name] M.D. On: 10/05/2016 18:28    Review of Systems  Neurological: Positive for headaches. Negative for loss of consciousness.  All other systems reviewed and are negative.   Blood pressure 145/69, pulse 88, temperature 98.9 F (37.2 C), temperature source Oral, resp. rate 15, height _0  (1.575 m), weight 75.8 kg (167 lb), SpO2 96 %. Physical Exam  Vitals reviewed. Constitutional: She is oriented to person, place, and time. She appears well-developed and well-nourished.  HENT:  Head:    Right Ear: External ear normal.  Left Ear: External ear normal.  Eyes: EOM are normal. Pupils are equal, round, and reactive to light.  Neck: Normal range of motion. Neck supple.  Cardiovascular: Normal rate, regular rhythm and normal heart sounds.   Respiratory: Effort normal and breath sounds normal.  GI: Soft. Bowel sounds are normal.  Musculoskeletal:       Right shoulder: She exhibits decreased range of motion, tenderness, bony tenderness and crepitus. She exhibits no swelling.  Neurological: She is alert and oriented to person, place, and time. She has normal reflexes.  Skin: Skin is warm and dry.  Psychiatric: She has a normal mood and affect. Her behavior is normal. Judgment and thought content normal.     Assessment/Plan Ground level fall Nasal fracture SDH left temporal and parafalcine Right humeral neck fracture  Admit for observation, repeat CT head in the AM.  The patient is on no blood thinners and has a GCS of 15  Admit to the ICU   Abbe Bula 10/05/2016, 9:29 PM   Procedures

## 2016-10-05 NOTE — ED Notes (Addendum)
Linda Copeland [phone] son [phone] home [name] husband

## 2016-10-05 NOTE — ED Notes (Signed)
Patient transported to CT 

## 2016-10-05 NOTE — Consult Note (Signed)
Reason for Consult: Subdural hematoma Referring Physician: Emergency department Dr. [name] is an 83 y.o. female.  HPI: 59[age]1/4-year-old female who was heard to a fall and denies any loss of consciousness no amnesia of the event extensive facial injuries patient brought to the ER was worked up noted to have facial fractures humerus fracture and a subdural hematoma and we have been consult. Early the patient's complaining of headache face pain and right upper cavity pain denies any numbness in her left arm or legs. Denies any nausea.  Past Medical History:  Diagnosis Date  . Hypertension   . Thyroid disease     History reviewed. No pertinent surgical history.  History reviewed. No pertinent family history.  Social History:  reports that she has never smoked. She does not have any smokeless tobacco history on file. She reports that she does not drink alcohol or use drugs.  Allergies:  Allergies  Allergen Reactions  . Codeine Nausea And Vomiting    Medications: I have reviewed the patient's current medications.  Results for orders placed or performed during the hospital encounter of 10/05/16 (from the past 48 hour(s))  Basic metabolic panel     Status: Abnormal   Collection Time: 10/05/16  7:10 PM  Result Value Ref Range   Sodium 136 135 - 145 mmol/L   Potassium 3.8 3.5 - 5.1 mmol/L   Chloride 104 101 - 111 mmol/L   CO2 22 22 - 32 mmol/L   Glucose, Bld 143 (H) 65 - 99 mg/dL   BUN 9 6 - 20 mg/dL   Creatinine, Ser 0.51 0.44 - 1.00 mg/dL   Calcium 9.3 8.9 - 10.3 mg/dL   GFR calc non Af Amer >60 >60 mL/min   GFR calc Af Amer >60 >60 mL/min    Comment: (NOTE) The eGFR has been calculated using the CKD EPI equation. This calculation has not been validated in all clinical situations. eGFR's persistently <60 mL/min signify possible Chronic Kidney Disease.    Anion gap 10 5 - 15  CBC with Differential     Status: Abnormal   Collection Time: 10/05/16  7:10 PM  Result  Value Ref Range   WBC 14.7 (H) 4.0 - 10.5 K/uL   RBC 4.81 3.87 - 5.11 MIL/uL   Hemoglobin 14.4 12.0 - 15.0 g/dL   HCT 42.8 36.0 - 46.0 %   MCV 89.0 78.0 - 100.0 fL   MCH 29.9 26.0 - 34.0 pg   MCHC 33.6 30.0 - 36.0 g/dL   RDW 12.6 11.5 - 15.5 %   Platelets 237 150 - 400 K/uL   Neutrophils Relative % 86 %   Neutro Abs 12.7 (H) 1.7 - 7.7 K/uL   Lymphocytes Relative 10 %   Lymphs Abs 1.4 0.7 - 4.0 K/uL   Monocytes Relative 4 %   Monocytes Absolute 0.5 0.1 - 1.0 K/uL   Eosinophils Relative 0 %   Eosinophils Absolute 0.0 0.0 - 0.7 K/uL   Basophils Relative 0 %   Basophils Absolute 0.0 0.0 - 0.1 K/uL  Protime-INR     Status: None   Collection Time: 10/05/16  7:10 PM  Result Value Ref Range   Prothrombin Time 14.2 11.4 - 15.2 seconds   INR 1.10     Dg Shoulder Right  Result Date: 10/05/2016 CLINICAL DATA:  Right shoulder pain and deformity after fall. Is fallen are EXAM: RIGHT SHOULDER - 2+ VIEW COMPARISON:  None. FINDINGS: There is an acute, closed, transverse fracture through the  surgical neck of the humerus. Equivocal greater tuberosity fracture suggested on the AP view of the shoulder not confirmed however on the scapular Y-views. No significant angulation or displacement. The scapula, AC joint, visualized right ribs and lung are unremarkable. No malalignment of the glenohumeral joint. IMPRESSION: Neer category 1 part fracture of the surgical neck of the right humerus without significant angulation nor displacement. Equivocal greater tuberosity fracture is also suggested on the AP view but not confirmed on the lateral projections. Electronically Signed   By: Linda Copeland M.D.   On: 10/05/2016 18:35   Ct Head Wo Contrast  Addendum Date: 10/05/2016   ADDENDUM REPORT: 10/05/2016 18:47 ADDENDUM: Critical Value/emergent results were called by telephone at the time of interpretation on 10/05/2016 at 1830 hours to Dr. Theotis Copeland , who verbally acknowledged these results. Electronically Signed    By: Linda Copeland M.D.   On: 10/05/2016 18:47   Result Date: 10/05/2016 CLINICAL DATA:  83 year old female status post trip and fall on rough concrete outside this afternoon. Face and nose laceration. Ecchymoses. Initial encounter. EXAM: CT HEAD WITHOUT CONTRAST CT MAXILLOFACIAL WITHOUT CONTRAST CT CERVICAL SPINE WITHOUT CONTRAST TECHNIQUE: Multidetector CT imaging of the head, cervical spine, and maxillofacial structures were performed using the standard protocol without intravenous contrast. Multiplanar CT image reconstructions of the cervical spine and maxillofacial structures were also generated. COMPARISON:  Cervical spine radiographs 03/26/2005. FINDINGS: CT HEAD FINDINGS Brain: Subdural hematoma along the interhemispheric fissure measuring up to 15 mm in thickness. This is associated with globular left superior parasagittal hyperdensity measuring 10-12 mm is over the left superior frontal gyrus as seen on series 203, image 35. Mild regional mass effect. No definite cerebral edema. Subdural hematoma tracks along the left tentorium measuring up to 2-3 mm in thickness there. There is also trace left lateral convexity subdural hematoma most visible at the operculum and measuring 2-3 mm. No peripheral right side subdural. No other extra-axial hemorrhage identified. No midline shift. No ventriculomegaly. No cortically based acute infarct identified. Vascular: Dominant distal left vertebral artery. Calcified atherosclerosis at the skull base. Skull: Hyperostosis of the calvarium (normal variant). No calvarium fracture identified. Facial fractures described below. Other: Anterior scalp and forehead superficial hematoma and subcutaneous gas in keeping with lacerations. The posterior scalp soft tissues appear normal. See facial findings below. CT MAXILLOFACIAL FINDINGS Osseous: Mandible intact.  No maxilla or zygoma fracture. Bilateral nasal bone fractures, mostly nondisplaced on the left and comminuted and mildly displaced  on the right (series 302, image 37). There is also leftward deviation and angulation of the anterior bony nasal septum which could be acute. No central skullbase fracture. Orbits: No orbital wall fracture. Globes are intact. Post cataract surgery changes. No intraorbital hematoma or contusion. Sinuses: Mild bubbly opacity in the right sphenoid sinus. The other paranasal sinuses are clear. There is a small volume of blood or retained secretions in the nasal cavity. Tympanic cavities and mastoids are clear. Soft tissues: Soft tissue swelling overlying the knee nose. Broad-based forehead hematoma measuring up to 6 mm in thickness. Superimposed subcutaneous gas in keeping with associated lacerations. The lateral and more inferior face superficial soft tissues appear normal. Small volume retained secretions in the oropharynx. The adenoid soft tissue may be surgically absent. There is bulky lingual tonsillar hypertrophy. There does appear to be bilateral palatine tonsillar tissue present, with postinflammatory calcifications on the left. Negative visualized parapharyngeal, retropharyngeal and sublingual spaces. The submandibular glands appear to be atrophied. Negative parotid glands. Coarsely calcified right level  2/level 3 lymph node. No superior neck lymphadenopathy. CT CERVICAL SPINE FINDINGS Alignment: Exaggerated upper cervical lordosis and straightening of lower cervical lordosis. Trace anterolisthesis of C6 on C7, and similar anterolisthesis of C7 on T1. These appear degenerative. There is ankylosis of the C5-C6 level which may be congenitally incompletely segmented. Bilateral posterior element alignment is within normal limits. Skull base and vertebrae: Degenerative changes at the anterior C1 odontoid and also the left lateral C1-C2 and occipital condyles articulations. Bulky ligamentous hypertrophy about the odontoid. Visualized skull base is intact. No atlanto-occipital dissociation. No acute cervical spine  fracture identified. Soft tissues and spinal canal: No prevertebral fluid or swelling. No visible canal hematoma. Postinflammatory appearing densely calcified right neck lymph node. There is bilateral level 2 and/or level 3 lymph node enlargement up to 10-11 mm mm short axis. The bulky lingual and to a lesser extent palatine tonsillar enlargement is again noted. No lower cervical lymphadenopathy. Negative retropharyngeal space. Incidental retropharyngeal course of both common carotid arteries. Negative larynx. Diminutive thyroid. Bilateral carotid artery bifurcation. Disc levels: Possible mild degenerative cervical spinal stenosis at C4-C5. Widespread facet arthropathy. Upper chest: Mild respiratory motion artifact but negative visualized lung apices. Calcified aortic atherosclerosis. Visualized upper thoracic levels appear grossly intact. IMPRESSION: 1. Positive for acute intracranial hemorrhage. Left side predominantly parafalcine subdural hematoma measuring up to 15 in thickness along the interhemispheric fissure. Left tentorial and lateral subdural component measuring 2-3 mm in thickness. No significant intracranial mass effect or midline shift at this time. 2. No other acute traumatic injury to the brain identified. 3. Right greater than left nasal bone fractures. Possible associated anterior nasal septal fracture. Overlying soft tissue swelling. 4. Anterior forehead and scalp soft tissue injury without underlying fracture. 5. No acute fracture or listhesis identified in the cervical spine. Advanced skullbase and cervical spine degenerative changes. 6. Nonspecific mild to moderate bilateral level 2/3 cervical lymphadenopathy in association with bulky enlargement of the lingual tonsil. Mild asymmetry of the palatine tonsils. Recommend outpatient follow-up neck CT with IV contrast to evaluate further. 7.  Calcified aortic atherosclerosis. Electronically Signed: By: Linda Copeland M.D. On: 10/05/2016 18:28   Ct  Cervical Spine Wo Contrast  Addendum Date: 10/05/2016   ADDENDUM REPORT: 10/05/2016 18:47 ADDENDUM: Critical Value/emergent results were called by telephone at the time of interpretation on 10/05/2016 at 1830 hours to Dr. Theotis Copeland , who verbally acknowledged these results. Electronically Signed   By: Linda Copeland M.D.   On: 10/05/2016 18:47   Result Date: 10/05/2016 CLINICAL DATA:  83 year old female status post trip and fall on rough concrete outside this afternoon. Face and nose laceration. Ecchymoses. Initial encounter. EXAM: CT HEAD WITHOUT CONTRAST CT MAXILLOFACIAL WITHOUT CONTRAST CT CERVICAL SPINE WITHOUT CONTRAST TECHNIQUE: Multidetector CT imaging of the head, cervical spine, and maxillofacial structures were performed using the standard protocol without intravenous contrast. Multiplanar CT image reconstructions of the cervical spine and maxillofacial structures were also generated. COMPARISON:  Cervical spine radiographs 03/26/2005. FINDINGS: CT HEAD FINDINGS Brain: Subdural hematoma along the interhemispheric fissure measuring up to 15 mm in thickness. This is associated with globular left superior parasagittal hyperdensity measuring 10-12 mm is over the left superior frontal gyrus as seen on series 203, image 35. Mild regional mass effect. No definite cerebral edema. Subdural hematoma tracks along the left tentorium measuring up to 2-3 mm in thickness there. There is also trace left lateral convexity subdural hematoma most visible at the operculum and measuring 2-3 mm. No peripheral right side subdural. No  other extra-axial hemorrhage identified. No midline shift. No ventriculomegaly. No cortically based acute infarct identified. Vascular: Dominant distal left vertebral artery. Calcified atherosclerosis at the skull base. Skull: Hyperostosis of the calvarium (normal variant). No calvarium fracture identified. Facial fractures described below. Other: Anterior scalp and forehead superficial hematoma and  subcutaneous gas in keeping with lacerations. The posterior scalp soft tissues appear normal. See facial findings below. CT MAXILLOFACIAL FINDINGS Osseous: Mandible intact.  No maxilla or zygoma fracture. Bilateral nasal bone fractures, mostly nondisplaced on the left and comminuted and mildly displaced on the right (series 302, image 37). There is also leftward deviation and angulation of the anterior bony nasal septum which could be acute. No central skullbase fracture. Orbits: No orbital wall fracture. Globes are intact. Post cataract surgery changes. No intraorbital hematoma or contusion. Sinuses: Mild bubbly opacity in the right sphenoid sinus. The other paranasal sinuses are clear. There is a small volume of blood or retained secretions in the nasal cavity. Tympanic cavities and mastoids are clear. Soft tissues: Soft tissue swelling overlying the knee nose. Broad-based forehead hematoma measuring up to 6 mm in thickness. Superimposed subcutaneous gas in keeping with associated lacerations. The lateral and more inferior face superficial soft tissues appear normal. Small volume retained secretions in the oropharynx. The adenoid soft tissue may be surgically absent. There is bulky lingual tonsillar hypertrophy. There does appear to be bilateral palatine tonsillar tissue present, with postinflammatory calcifications on the left. Negative visualized parapharyngeal, retropharyngeal and sublingual spaces. The submandibular glands appear to be atrophied. Negative parotid glands. Coarsely calcified right level 2/level 3 lymph node. No superior neck lymphadenopathy. CT CERVICAL SPINE FINDINGS Alignment: Exaggerated upper cervical lordosis and straightening of lower cervical lordosis. Trace anterolisthesis of C6 on C7, and similar anterolisthesis of C7 on T1. These appear degenerative. There is ankylosis of the C5-C6 level which may be congenitally incompletely segmented. Bilateral posterior element alignment is within  normal limits. Skull base and vertebrae: Degenerative changes at the anterior C1 odontoid and also the left lateral C1-C2 and occipital condyles articulations. Bulky ligamentous hypertrophy about the odontoid. Visualized skull base is intact. No atlanto-occipital dissociation. No acute cervical spine fracture identified. Soft tissues and spinal canal: No prevertebral fluid or swelling. No visible canal hematoma. Postinflammatory appearing densely calcified right neck lymph node. There is bilateral level 2 and/or level 3 lymph node enlargement up to 10-11 mm mm short axis. The bulky lingual and to a lesser extent palatine tonsillar enlargement is again noted. No lower cervical lymphadenopathy. Negative retropharyngeal space. Incidental retropharyngeal course of both common carotid arteries. Negative larynx. Diminutive thyroid. Bilateral carotid artery bifurcation. Disc levels: Possible mild degenerative cervical spinal stenosis at C4-C5. Widespread facet arthropathy. Upper chest: Mild respiratory motion artifact but negative visualized lung apices. Calcified aortic atherosclerosis. Visualized upper thoracic levels appear grossly intact. IMPRESSION: 1. Positive for acute intracranial hemorrhage. Left side predominantly parafalcine subdural hematoma measuring up to 15 in thickness along the interhemispheric fissure. Left tentorial and lateral subdural component measuring 2-3 mm in thickness. No significant intracranial mass effect or midline shift at this time. 2. No other acute traumatic injury to the brain identified. 3. Right greater than left nasal bone fractures. Possible associated anterior nasal septal fracture. Overlying soft tissue swelling. 4. Anterior forehead and scalp soft tissue injury without underlying fracture. 5. No acute fracture or listhesis identified in the cervical spine. Advanced skullbase and cervical spine degenerative changes. 6. Nonspecific mild to moderate bilateral level 2/3 cervical  lymphadenopathy in association with bulky  enlargement of the lingual tonsil. Mild asymmetry of the palatine tonsils. Recommend outpatient follow-up neck CT with IV contrast to evaluate further. 7.  Calcified aortic atherosclerosis. Electronically Signed: By: Linda Copeland M.D. On: 10/05/2016 18:28   Ct Maxillofacial Wo Cm  Addendum Date: 10/05/2016   ADDENDUM REPORT: 10/05/2016 18:47 ADDENDUM: Critical Value/emergent results were called by telephone at the time of interpretation on 10/05/2016 at 1830 hours to Dr. Theotis Copeland , who verbally acknowledged these results. Electronically Signed   By: Linda Copeland M.D.   On: 10/05/2016 18:47   Result Date: 10/05/2016 CLINICAL DATA:  83 year old female status post trip and fall on rough concrete outside this afternoon. Face and nose laceration. Ecchymoses. Initial encounter. EXAM: CT HEAD WITHOUT CONTRAST CT MAXILLOFACIAL WITHOUT CONTRAST CT CERVICAL SPINE WITHOUT CONTRAST TECHNIQUE: Multidetector CT imaging of the head, cervical spine, and maxillofacial structures were performed using the standard protocol without intravenous contrast. Multiplanar CT image reconstructions of the cervical spine and maxillofacial structures were also generated. COMPARISON:  Cervical spine radiographs 03/26/2005. FINDINGS: CT HEAD FINDINGS Brain: Subdural hematoma along the interhemispheric fissure measuring up to 15 mm in thickness. This is associated with globular left superior parasagittal hyperdensity measuring 10-12 mm is over the left superior frontal gyrus as seen on series 203, image 35. Mild regional mass effect. No definite cerebral edema. Subdural hematoma tracks along the left tentorium measuring up to 2-3 mm in thickness there. There is also trace left lateral convexity subdural hematoma most visible at the operculum and measuring 2-3 mm. No peripheral right side subdural. No other extra-axial hemorrhage identified. No midline shift. No ventriculomegaly. No cortically based acute  infarct identified. Vascular: Dominant distal left vertebral artery. Calcified atherosclerosis at the skull base. Skull: Hyperostosis of the calvarium (normal variant). No calvarium fracture identified. Facial fractures described below. Other: Anterior scalp and forehead superficial hematoma and subcutaneous gas in keeping with lacerations. The posterior scalp soft tissues appear normal. See facial findings below. CT MAXILLOFACIAL FINDINGS Osseous: Mandible intact.  No maxilla or zygoma fracture. Bilateral nasal bone fractures, mostly nondisplaced on the left and comminuted and mildly displaced on the right (series 302, image 37). There is also leftward deviation and angulation of the anterior bony nasal septum which could be acute. No central skullbase fracture. Orbits: No orbital wall fracture. Globes are intact. Post cataract surgery changes. No intraorbital hematoma or contusion. Sinuses: Mild bubbly opacity in the right sphenoid sinus. The other paranasal sinuses are clear. There is a small volume of blood or retained secretions in the nasal cavity. Tympanic cavities and mastoids are clear. Soft tissues: Soft tissue swelling overlying the knee nose. Broad-based forehead hematoma measuring up to 6 mm in thickness. Superimposed subcutaneous gas in keeping with associated lacerations. The lateral and more inferior face superficial soft tissues appear normal. Small volume retained secretions in the oropharynx. The adenoid soft tissue may be surgically absent. There is bulky lingual tonsillar hypertrophy. There does appear to be bilateral palatine tonsillar tissue present, with postinflammatory calcifications on the left. Negative visualized parapharyngeal, retropharyngeal and sublingual spaces. The submandibular glands appear to be atrophied. Negative parotid glands. Coarsely calcified right level 2/level 3 lymph node. No superior neck lymphadenopathy. CT CERVICAL SPINE FINDINGS Alignment: Exaggerated upper  cervical lordosis and straightening of lower cervical lordosis. Trace anterolisthesis of C6 on C7, and similar anterolisthesis of C7 on T1. These appear degenerative. There is ankylosis of the C5-C6 level which may be congenitally incompletely segmented. Bilateral posterior element alignment is within normal limits.  Skull base and vertebrae: Degenerative changes at the anterior C1 odontoid and also the left lateral C1-C2 and occipital condyles articulations. Bulky ligamentous hypertrophy about the odontoid. Visualized skull base is intact. No atlanto-occipital dissociation. No acute cervical spine fracture identified. Soft tissues and spinal canal: No prevertebral fluid or swelling. No visible canal hematoma. Postinflammatory appearing densely calcified right neck lymph node. There is bilateral level 2 and/or level 3 lymph node enlargement up to 10-11 mm mm short axis. The bulky lingual and to a lesser extent palatine tonsillar enlargement is again noted. No lower cervical lymphadenopathy. Negative retropharyngeal space. Incidental retropharyngeal course of both common carotid arteries. Negative larynx. Diminutive thyroid. Bilateral carotid artery bifurcation. Disc levels: Possible mild degenerative cervical spinal stenosis at C4-C5. Widespread facet arthropathy. Upper chest: Mild respiratory motion artifact but negative visualized lung apices. Calcified aortic atherosclerosis. Visualized upper thoracic levels appear grossly intact. IMPRESSION: 1. Positive for acute intracranial hemorrhage. Left side predominantly parafalcine subdural hematoma measuring up to 15 in thickness along the interhemispheric fissure. Left tentorial and lateral subdural component measuring 2-3 mm in thickness. No significant intracranial mass effect or midline shift at this time. 2. No other acute traumatic injury to the brain identified. 3. Right greater than left nasal bone fractures. Possible associated anterior nasal septal fracture.  Overlying soft tissue swelling. 4. Anterior forehead and scalp soft tissue injury without underlying fracture. 5. No acute fracture or listhesis identified in the cervical spine. Advanced skullbase and cervical spine degenerative changes. 6. Nonspecific mild to moderate bilateral level 2/3 cervical lymphadenopathy in association with bulky enlargement of the lingual tonsil. Mild asymmetry of the palatine tonsils. Recommend outpatient follow-up neck CT with IV contrast to evaluate further. 7.  Calcified aortic atherosclerosis. Electronically Signed: By: Linda Copeland M.D. On: 10/05/2016 18:28    Review of Systems  HENT: Positive for nosebleeds.   Neurological: Positive for headaches.   Blood pressure 169/73, pulse 84, temperature 97.7 F (36.5 C), temperature source Oral, resp. rate 18, height _0  (1.575 m), weight 75.8 kg (167 lb), SpO2 99 %. Physical Exam  Neurological: She has normal strength. GCS eye subscore is 4. GCS verbal subscore is 5. GCS motor subscore is 6.  Patient is awake alert pupils are equal extraocular movements are intact cranial nerves are intact strength appears to be 5 out of 5 upper and lower extremities right upper extremity is in a sling secondary to a humerus fracture so incomplete assessment to the right upper extremity. Face shows extensive soft tissue trauma and swelling    Assessment/Plan: [birth year] year female with a parafalcine subdural hematoma local mass effect only. This is a nonoperative subdural this point. Hold all blood thinners hold aspirin patient will be admitted to trauma will need ENT consult as well as orthopedic consult repeat CT in the morning cervical spine preliminarily negative except for degenerative disease  Linda Copeland P 10/05/2016, 8:07 PM

## 2016-10-05 NOTE — ED Notes (Signed)
Patient transported to X-ray 

## 2016-10-05 NOTE — ED Provider Notes (Signed)
[facility] DEPT Provider Note   CSN: [id number] Arrival date & time: 10/05/16  1630   By signing my name below, I, Linda Copeland, attest that this documentation has been prepared under the direction and in the presence of [name], MD . Electronically Signed: Neta Copeland, ED Scribe. 10/05/2016. 6:51 PM.   History   Chief Complaint Chief Complaint  Patient presents with  . Fall   The history is provided by the patient. No language interpreter was used.   HPI Comments:  Linda Copeland is a 83 y.o. female with PMHx of HTN and thyroid disease who presents to the Emergency Department s/p fall that occurred PTA. Pt reports that she tripped over a curb and fell forward, hitting her face and bracing her fall with her right arm. Pt complains of pain to her right upper arm, and notes several wounds to her face with bleeding controlled. Tetanus is UTD. Pt takes 81mg  aspirin daily and lisinopril. Pt denies LOC, visual changes, nausea.  No other extremity injuries. Currently her pain is minimal in arm if she doesn't move it.  Past Medical History:  Diagnosis Date  . Hypertension   . Thyroid disease     There are no active problems to display for this patient.   History reviewed. No pertinent surgical history.  OB History    No data available       Home Medications    Prior to Admission medications   Medication Sig Start Date End Date Taking? Authorizing Provider  doxycycline (VIBRAMYCIN) 100 MG capsule Take 1 capsule (100 mg total) by mouth 2 (two) times daily. 04/12/15   Montine Circle, PA-C  famotidine (PEPCID) 20 MG tablet Take 1 tablet (20 mg total) by mouth 2 (two) times daily. 04/12/15   Montine Circle, PA-C  triamcinolone (KENALOG) 0.025 % ointment Apply 1 application topically 2 (two) times daily. Do not apply to face 04/12/15   Montine Circle, PA-C    Family History History reviewed. No pertinent family history.  Social History Social History    Substance Use Topics  . Smoking status: Never Smoker  . Smokeless tobacco: Not on file  . Alcohol use No     Allergies   Codeine   Review of Systems Review of Systems 10 Systems reviewed and are negative for acute change except as noted in the HPI.   Physical Exam Updated Vital Signs BP 151/64   Pulse 84   Temp 98.9 F (37.2 C) (Oral)   Resp 20   Ht 5\' 2"  (1.575 m)   Wt 167 lb (75.8 kg)   SpO2 97%   BMI 30.54 kg/m   Physical Exam  Constitutional: She is oriented to person, place, and time. She appears well-developed and well-nourished. No distress.  Awake, alert  HENT:  Swelling and ecchymoses of forehead, nose with significant swelling of nasal bridge; dried blood b/l nares and between eyebrows; abrasion tip of nose; b/l periorbital ecchymoses  Eyes: Conjunctivae and EOM are normal. Pupils are equal, round, and reactive to light.  Neck: Neck supple.  Cardiovascular: Normal rate, regular rhythm, normal heart sounds and intact distal pulses.   No murmur heard. Pulmonary/Chest: Effort normal and breath sounds normal. No respiratory distress.  Abdominal: Soft. Bowel sounds are normal. She exhibits no distension. There is no tenderness.  Musculoskeletal: She exhibits edema, tenderness and deformity.  Swelling and tenderness of anterior R shoulder w/ no tenderness at elbow; normal grip strength; normal sensation  Neurological: She is alert  and oriented to person, place, and time. She has normal reflexes. No cranial nerve deficit. She exhibits normal muscle tone.  Fluent speech 5/5 strength LUE, BLE; unable to fully assess strength RUE due to injury normal sensation x all 4 extremities  Skin: Skin is warm and dry.  Psychiatric: She has a normal mood and affect. Judgment and thought content normal.  Nursing note and vitals reviewed.    ED Treatments / Results  DIAGNOSTIC STUDIES:  Oxygen Saturation is 99% on RA, normal by my interpretation.    COORDINATION OF  CARE:  6:52 PM Discussed treatment plan with pt at bedside and pt agreed to plan.   Labs (all labs ordered are listed, but only abnormal results are displayed) Labs Reviewed  BASIC METABOLIC PANEL - Abnormal; Notable for the following:       Result Value   Glucose, Bld 143 (*)    All other components within normal limits  CBC WITH DIFFERENTIAL/PLATELET - Abnormal; Notable for the following:    WBC 14.7 (*)    Neutro Abs 12.7 (*)    All other components within normal limits  PROTIME-INR    EKG  EKG Interpretation None       Radiology Dg Shoulder Right  Result Date: 10/05/2016 CLINICAL DATA:  Right shoulder pain and deformity after fall. Is fallen are EXAM: RIGHT SHOULDER - 2+ VIEW COMPARISON:  None. FINDINGS: There is an acute, closed, transverse fracture through the surgical neck of the humerus. Equivocal greater tuberosity fracture suggested on the AP view of the shoulder not confirmed however on the scapular Y-views. No significant angulation or displacement. The scapula, AC joint, visualized right ribs and lung are unremarkable. No malalignment of the glenohumeral joint. IMPRESSION: Neer category 1 part fracture of the surgical neck of the right humerus without significant angulation nor displacement. Equivocal greater tuberosity fracture is also suggested on the AP view but not confirmed on the lateral projections. Electronically Signed   By: [name] M.D.   On: 10/05/2016 18:35   Ct Head Wo Contrast  Addendum Date: 10/05/2016   ADDENDUM REPORT: 10/05/2016 18:47 ADDENDUM: Critical Value/emergent results were called by telephone at the time of interpretation on 10/05/2016 at 1830 hours to Dr. Theotis Burrow , who verbally acknowledged these results. Electronically Signed   By: [name] M.D.   On: 10/05/2016 18:47   Result Date: 10/05/2016 CLINICAL DATA:  83 year old female status post trip and fall on rough concrete outside this afternoon. Face and nose laceration. Ecchymoses.  Initial encounter. EXAM: CT HEAD WITHOUT CONTRAST CT MAXILLOFACIAL WITHOUT CONTRAST CT CERVICAL SPINE WITHOUT CONTRAST TECHNIQUE: Multidetector CT imaging of the head, cervical spine, and maxillofacial structures were performed using the standard protocol without intravenous contrast. Multiplanar CT image reconstructions of the cervical spine and maxillofacial structures were also generated. COMPARISON:  Cervical spine radiographs 03/26/2005. FINDINGS: CT HEAD FINDINGS Brain: Subdural hematoma along the interhemispheric fissure measuring up to 15 mm in thickness. This is associated with globular left superior parasagittal hyperdensity measuring 10-12 mm is over the left superior frontal gyrus as seen on series 203, image 35. Mild regional mass effect. No definite cerebral edema. Subdural hematoma tracks along the left tentorium measuring up to 2-3 mm in thickness there. There is also trace left lateral convexity subdural hematoma most visible at the operculum and measuring 2-3 mm. No peripheral right side subdural. No other extra-axial hemorrhage identified. No midline shift. No ventriculomegaly. No cortically based acute infarct identified. Vascular: Dominant distal left vertebral artery.  Calcified atherosclerosis at the skull base. Skull: Hyperostosis of the calvarium (normal variant). No calvarium fracture identified. Facial fractures described below. Other: Anterior scalp and forehead superficial hematoma and subcutaneous gas in keeping with lacerations. The posterior scalp soft tissues appear normal. See facial findings below. CT MAXILLOFACIAL FINDINGS Osseous: Mandible intact.  No maxilla or zygoma fracture. Bilateral nasal bone fractures, mostly nondisplaced on the left and comminuted and mildly displaced on the right (series 302, image 37). There is also leftward deviation and angulation of the anterior bony nasal septum which could be acute. No central skullbase fracture. Orbits: No orbital wall fracture.  Globes are intact. Post cataract surgery changes. No intraorbital hematoma or contusion. Sinuses: Mild bubbly opacity in the right sphenoid sinus. The other paranasal sinuses are clear. There is a small volume of blood or retained secretions in the nasal cavity. Tympanic cavities and mastoids are clear. Soft tissues: Soft tissue swelling overlying the knee nose. Broad-based forehead hematoma measuring up to 6 mm in thickness. Superimposed subcutaneous gas in keeping with associated lacerations. The lateral and more inferior face superficial soft tissues appear normal. Small volume retained secretions in the oropharynx. The adenoid soft tissue may be surgically absent. There is bulky lingual tonsillar hypertrophy. There does appear to be bilateral palatine tonsillar tissue present, with postinflammatory calcifications on the left. Negative visualized parapharyngeal, retropharyngeal and sublingual spaces. The submandibular glands appear to be atrophied. Negative parotid glands. Coarsely calcified right level 2/level 3 lymph node. No superior neck lymphadenopathy. CT CERVICAL SPINE FINDINGS Alignment: Exaggerated upper cervical lordosis and straightening of lower cervical lordosis. Trace anterolisthesis of C6 on C7, and similar anterolisthesis of C7 on T1. These appear degenerative. There is ankylosis of the C5-C6 level which may be congenitally incompletely segmented. Bilateral posterior element alignment is within normal limits. Skull base and vertebrae: Degenerative changes at the anterior C1 odontoid and also the left lateral C1-C2 and occipital condyles articulations. Bulky ligamentous hypertrophy about the odontoid. Visualized skull base is intact. No atlanto-occipital dissociation. No acute cervical spine fracture identified. Soft tissues and spinal canal: No prevertebral fluid or swelling. No visible canal hematoma. Postinflammatory appearing densely calcified right neck lymph node. There is bilateral level 2  and/or level 3 lymph node enlargement up to 10-11 mm mm short axis. The bulky lingual and to a lesser extent palatine tonsillar enlargement is again noted. No lower cervical lymphadenopathy. Negative retropharyngeal space. Incidental retropharyngeal course of both common carotid arteries. Negative larynx. Diminutive thyroid. Bilateral carotid artery bifurcation. Disc levels: Possible mild degenerative cervical spinal stenosis at C4-C5. Widespread facet arthropathy. Upper chest: Mild respiratory motion artifact but negative visualized lung apices. Calcified aortic atherosclerosis. Visualized upper thoracic levels appear grossly intact. IMPRESSION: 1. Positive for acute intracranial hemorrhage. Left side predominantly parafalcine subdural hematoma measuring up to 15 in thickness along the interhemispheric fissure. Left tentorial and lateral subdural component measuring 2-3 mm in thickness. No significant intracranial mass effect or midline shift at this time. 2. No other acute traumatic injury to the brain identified. 3. Right greater than left nasal bone fractures. Possible associated anterior nasal septal fracture. Overlying soft tissue swelling. 4. Anterior forehead and scalp soft tissue injury without underlying fracture. 5. No acute fracture or listhesis identified in the cervical spine. Advanced skullbase and cervical spine degenerative changes. 6. Nonspecific mild to moderate bilateral level 2/3 cervical lymphadenopathy in association with bulky enlargement of the lingual tonsil. Mild asymmetry of the palatine tonsils. Recommend outpatient follow-up neck CT with IV contrast to evaluate further.  7.  Calcified aortic atherosclerosis. Electronically Signed: By: [name] M.D. On: 10/05/2016 18:28   Ct Cervical Spine Wo Contrast  Addendum Date: 10/05/2016   ADDENDUM REPORT: 10/05/2016 18:47 ADDENDUM: Critical Value/emergent results were called by telephone at the time of interpretation on 10/05/2016 at 1830 hours to  Dr. Theotis Burrow , who verbally acknowledged these results. Electronically Signed   By: [name] M.D.   On: 10/05/2016 18:47   Result Date: 10/05/2016 CLINICAL DATA:  83 year old female status post trip and fall on rough concrete outside this afternoon. Face and nose laceration. Ecchymoses. Initial encounter. EXAM: CT HEAD WITHOUT CONTRAST CT MAXILLOFACIAL WITHOUT CONTRAST CT CERVICAL SPINE WITHOUT CONTRAST TECHNIQUE: Multidetector CT imaging of the head, cervical spine, and maxillofacial structures were performed using the standard protocol without intravenous contrast. Multiplanar CT image reconstructions of the cervical spine and maxillofacial structures were also generated. COMPARISON:  Cervical spine radiographs 03/26/2005. FINDINGS: CT HEAD FINDINGS Brain: Subdural hematoma along the interhemispheric fissure measuring up to 15 mm in thickness. This is associated with globular left superior parasagittal hyperdensity measuring 10-12 mm is over the left superior frontal gyrus as seen on series 203, image 35. Mild regional mass effect. No definite cerebral edema. Subdural hematoma tracks along the left tentorium measuring up to 2-3 mm in thickness there. There is also trace left lateral convexity subdural hematoma most visible at the operculum and measuring 2-3 mm. No peripheral right side subdural. No other extra-axial hemorrhage identified. No midline shift. No ventriculomegaly. No cortically based acute infarct identified. Vascular: Dominant distal left vertebral artery. Calcified atherosclerosis at the skull base. Skull: Hyperostosis of the calvarium (normal variant). No calvarium fracture identified. Facial fractures described below. Other: Anterior scalp and forehead superficial hematoma and subcutaneous gas in keeping with lacerations. The posterior scalp soft tissues appear normal. See facial findings below. CT MAXILLOFACIAL FINDINGS Osseous: Mandible intact.  No maxilla or zygoma fracture. Bilateral  nasal bone fractures, mostly nondisplaced on the left and comminuted and mildly displaced on the right (series 302, image 37). There is also leftward deviation and angulation of the anterior bony nasal septum which could be acute. No central skullbase fracture. Orbits: No orbital wall fracture. Globes are intact. Post cataract surgery changes. No intraorbital hematoma or contusion. Sinuses: Mild bubbly opacity in the right sphenoid sinus. The other paranasal sinuses are clear. There is a small volume of blood or retained secretions in the nasal cavity. Tympanic cavities and mastoids are clear. Soft tissues: Soft tissue swelling overlying the knee nose. Broad-based forehead hematoma measuring up to 6 mm in thickness. Superimposed subcutaneous gas in keeping with associated lacerations. The lateral and more inferior face superficial soft tissues appear normal. Small volume retained secretions in the oropharynx. The adenoid soft tissue may be surgically absent. There is bulky lingual tonsillar hypertrophy. There does appear to be bilateral palatine tonsillar tissue present, with postinflammatory calcifications on the left. Negative visualized parapharyngeal, retropharyngeal and sublingual spaces. The submandibular glands appear to be atrophied. Negative parotid glands. Coarsely calcified right level 2/level 3 lymph node. No superior neck lymphadenopathy. CT CERVICAL SPINE FINDINGS Alignment: Exaggerated upper cervical lordosis and straightening of lower cervical lordosis. Trace anterolisthesis of C6 on C7, and similar anterolisthesis of C7 on T1. These appear degenerative. There is ankylosis of the C5-C6 level which may be congenitally incompletely segmented. Bilateral posterior element alignment is within normal limits. Skull base and vertebrae: Degenerative changes at the anterior C1 odontoid and also the left lateral C1-C2 and occipital condyles articulations.  Bulky ligamentous hypertrophy about the odontoid.  Visualized skull base is intact. No atlanto-occipital dissociation. No acute cervical spine fracture identified. Soft tissues and spinal canal: No prevertebral fluid or swelling. No visible canal hematoma. Postinflammatory appearing densely calcified right neck lymph node. There is bilateral level 2 and/or level 3 lymph node enlargement up to 10-11 mm mm short axis. The bulky lingual and to a lesser extent palatine tonsillar enlargement is again noted. No lower cervical lymphadenopathy. Negative retropharyngeal space. Incidental retropharyngeal course of both common carotid arteries. Negative larynx. Diminutive thyroid. Bilateral carotid artery bifurcation. Disc levels: Possible mild degenerative cervical spinal stenosis at C4-C5. Widespread facet arthropathy. Upper chest: Mild respiratory motion artifact but negative visualized lung apices. Calcified aortic atherosclerosis. Visualized upper thoracic levels appear grossly intact. IMPRESSION: 1. Positive for acute intracranial hemorrhage. Left side predominantly parafalcine subdural hematoma measuring up to 15 in thickness along the interhemispheric fissure. Left tentorial and lateral subdural component measuring 2-3 mm in thickness. No significant intracranial mass effect or midline shift at this time. 2. No other acute traumatic injury to the brain identified. 3. Right greater than left nasal bone fractures. Possible associated anterior nasal septal fracture. Overlying soft tissue swelling. 4. Anterior forehead and scalp soft tissue injury without underlying fracture. 5. No acute fracture or listhesis identified in the cervical spine. Advanced skullbase and cervical spine degenerative changes. 6. Nonspecific mild to moderate bilateral level 2/3 cervical lymphadenopathy in association with bulky enlargement of the lingual tonsil. Mild asymmetry of the palatine tonsils. Recommend outpatient follow-up neck CT with IV contrast to evaluate further. 7.  Calcified aortic  atherosclerosis. Electronically Signed: By: [name] M.D. On: 10/05/2016 18:28   Ct Maxillofacial Wo Cm  Addendum Date: 10/05/2016   ADDENDUM REPORT: 10/05/2016 18:47 ADDENDUM: Critical Value/emergent results were called by telephone at the time of interpretation on 10/05/2016 at 1830 hours to Dr. Theotis Burrow , who verbally acknowledged these results. Electronically Signed   By: [name] M.D.   On: 10/05/2016 18:47   Result Date: 10/05/2016 CLINICAL DATA:  83 year old female status post trip and fall on rough concrete outside this afternoon. Face and nose laceration. Ecchymoses. Initial encounter. EXAM: CT HEAD WITHOUT CONTRAST CT MAXILLOFACIAL WITHOUT CONTRAST CT CERVICAL SPINE WITHOUT CONTRAST TECHNIQUE: Multidetector CT imaging of the head, cervical spine, and maxillofacial structures were performed using the standard protocol without intravenous contrast. Multiplanar CT image reconstructions of the cervical spine and maxillofacial structures were also generated. COMPARISON:  Cervical spine radiographs 03/26/2005. FINDINGS: CT HEAD FINDINGS Brain: Subdural hematoma along the interhemispheric fissure measuring up to 15 mm in thickness. This is associated with globular left superior parasagittal hyperdensity measuring 10-12 mm is over the left superior frontal gyrus as seen on series 203, image 35. Mild regional mass effect. No definite cerebral edema. Subdural hematoma tracks along the left tentorium measuring up to 2-3 mm in thickness there. There is also trace left lateral convexity subdural hematoma most visible at the operculum and measuring 2-3 mm. No peripheral right side subdural. No other extra-axial hemorrhage identified. No midline shift. No ventriculomegaly. No cortically based acute infarct identified. Vascular: Dominant distal left vertebral artery. Calcified atherosclerosis at the skull base. Skull: Hyperostosis of the calvarium (normal variant). No calvarium fracture identified. Facial fractures  described below. Other: Anterior scalp and forehead superficial hematoma and subcutaneous gas in keeping with lacerations. The posterior scalp soft tissues appear normal. See facial findings below. CT MAXILLOFACIAL FINDINGS Osseous: Mandible intact.  No maxilla or zygoma fracture. Bilateral nasal  bone fractures, mostly nondisplaced on the left and comminuted and mildly displaced on the right (series 302, image 37). There is also leftward deviation and angulation of the anterior bony nasal septum which could be acute. No central skullbase fracture. Orbits: No orbital wall fracture. Globes are intact. Post cataract surgery changes. No intraorbital hematoma or contusion. Sinuses: Mild bubbly opacity in the right sphenoid sinus. The other paranasal sinuses are clear. There is a small volume of blood or retained secretions in the nasal cavity. Tympanic cavities and mastoids are clear. Soft tissues: Soft tissue swelling overlying the knee nose. Broad-based forehead hematoma measuring up to 6 mm in thickness. Superimposed subcutaneous gas in keeping with associated lacerations. The lateral and more inferior face superficial soft tissues appear normal. Small volume retained secretions in the oropharynx. The adenoid soft tissue may be surgically absent. There is bulky lingual tonsillar hypertrophy. There does appear to be bilateral palatine tonsillar tissue present, with postinflammatory calcifications on the left. Negative visualized parapharyngeal, retropharyngeal and sublingual spaces. The submandibular glands appear to be atrophied. Negative parotid glands. Coarsely calcified right level 2/level 3 lymph node. No superior neck lymphadenopathy. CT CERVICAL SPINE FINDINGS Alignment: Exaggerated upper cervical lordosis and straightening of lower cervical lordosis. Trace anterolisthesis of C6 on C7, and similar anterolisthesis of C7 on T1. These appear degenerative. There is ankylosis of the C5-C6 level which may be  congenitally incompletely segmented. Bilateral posterior element alignment is within normal limits. Skull base and vertebrae: Degenerative changes at the anterior C1 odontoid and also the left lateral C1-C2 and occipital condyles articulations. Bulky ligamentous hypertrophy about the odontoid. Visualized skull base is intact. No atlanto-occipital dissociation. No acute cervical spine fracture identified. Soft tissues and spinal canal: No prevertebral fluid or swelling. No visible canal hematoma. Postinflammatory appearing densely calcified right neck lymph node. There is bilateral level 2 and/or level 3 lymph node enlargement up to 10-11 mm mm short axis. The bulky lingual and to a lesser extent palatine tonsillar enlargement is again noted. No lower cervical lymphadenopathy. Negative retropharyngeal space. Incidental retropharyngeal course of both common carotid arteries. Negative larynx. Diminutive thyroid. Bilateral carotid artery bifurcation. Disc levels: Possible mild degenerative cervical spinal stenosis at C4-C5. Widespread facet arthropathy. Upper chest: Mild respiratory motion artifact but negative visualized lung apices. Calcified aortic atherosclerosis. Visualized upper thoracic levels appear grossly intact. IMPRESSION: 1. Positive for acute intracranial hemorrhage. Left side predominantly parafalcine subdural hematoma measuring up to 15 in thickness along the interhemispheric fissure. Left tentorial and lateral subdural component measuring 2-3 mm in thickness. No significant intracranial mass effect or midline shift at this time. 2. No other acute traumatic injury to the brain identified. 3. Right greater than left nasal bone fractures. Possible associated anterior nasal septal fracture. Overlying soft tissue swelling. 4. Anterior forehead and scalp soft tissue injury without underlying fracture. 5. No acute fracture or listhesis identified in the cervical spine. Advanced skullbase and cervical spine  degenerative changes. 6. Nonspecific mild to moderate bilateral level 2/3 cervical lymphadenopathy in association with bulky enlargement of the lingual tonsil. Mild asymmetry of the palatine tonsils. Recommend outpatient follow-up neck CT with IV contrast to evaluate further. 7.  Calcified aortic atherosclerosis. Electronically Signed: By: [name] M.D. On: 10/05/2016 18:28    Procedures .Critical Care Performed by: [name] Authorized by: [name]   Critical care provider statement:    Critical care time (minutes):  45   Critical care time was exclusive of:  Separately billable procedures and treating  other patients   Critical care was necessary to treat or prevent imminent or life-threatening deterioration of the following conditions:  CNS failure or compromise   Critical care was time spent personally by me on the following activities:  Development of treatment plan with patient or surrogate, discussions with consultants, evaluation of patient's response to treatment, examination of patient, obtaining history from patient or surrogate, ordering and performing treatments and interventions, ordering and review of laboratory studies, ordering and review of radiographic studies, re-evaluation of patient's condition and review of old charts   (including critical care time) LACERATION REPAIR Performed by: Wenda Overland Myrta Mercer Authorized by: Wenda Overland Tristin Vandeusen Consent: Verbal consent obtained. Risks and benefits: risks, benefits and alternatives were discussed Consent given by: patient Patient identity confirmed: provided demographic data Prepped and Draped in normal sterile fashion Wound explored  Laceration Location: forehead  Laceration Length: 2.5cm, 2 cm  No Foreign Bodies seen or palpated  Anesthesia: local infiltration  Local anesthetic: none  Irrigation method: syringe Amount of cleaning: standard  Skin closure: dermabond on 2 lacerations  Number  of sutures: N/A  Technique: dermabond  Patient tolerance: Patient tolerated the procedure well with no immediate complications.  Medications Ordered in ED Medications - No data to display   Initial Impression / Assessment and Plan / ED Course  I have reviewed the triage vital signs and the nursing notes.  Pertinent labs & imaging results that were available during my care of the patient were reviewed by me and considered in my medical decision making (see chart for details).  Clinical Course    Patient presents with multiple facial injuries and right arm pain after a mechanical fall today. She was awake and alert, GCS 15, normal neurologic exam . Deformity of R upper humerus, neurovascularly intact. Significant swelling of nasal bridge and bruising on face.   Obtained imaging which shows surgical neck fracture of right humerus; left parafalcine subdural hematoma with no mass effect or midline shift; nasal bone fractures; incidental finding of cervical lymphadenopathy and enlarged tonsils. Formed patient and family of incidental finding and need for PCP follow-up for neck imaging.  Arm placed in sling for immobilization.   Guarding SDH, contacted Dr. Saintclair Halsted, neurosurgery. He evaluated pt and recommended observation w/ neuro checks, no acute management required. Contacted Trauma service, discussed w/ Dr. Hulen Skains. He will admit patient for further care. I appreciate Dr. Hulen Skains and Dr. Windy Carina assistance w/ patient care. Dermabonded wounds.   Final Clinical Impressions(s) / ED Diagnoses   Final diagnoses:  Subdural hematoma (HCC)  Closed fracture of proximal end of right humerus, unspecified fracture morphology, initial encounter  Facial laceration, initial encounter  Closed fracture of nasal bone, initial encounter    New Prescriptions New Prescriptions   No medications on file  I personally performed the services described in this documentation, which was scribed in my presence. The  recorded information has been reviewed and is accurate.     [name], MD 10/05/16 2104

## 2016-10-05 NOTE — ED Triage Notes (Signed)
Pt sts trip and fall today hitting face and pain in upper right arm; bleeding controlled to face with some bruising and swelling noted; pt denies LOC of blood thinners

## 2016-10-06 ENCOUNTER — Observation Stay (HOSPITAL_COMMUNITY): Payer: Medicare HMO

## 2016-10-06 ENCOUNTER — Encounter (HOSPITAL_COMMUNITY): Payer: Self-pay | Admitting: *Deleted

## 2016-10-06 DIAGNOSIS — S42231A 3-part fracture of surgical neck of right humerus, initial encounter for closed fracture: Secondary | ICD-10-CM | POA: Diagnosis not present

## 2016-10-06 DIAGNOSIS — S065X0A Traumatic subdural hemorrhage without loss of consciousness, initial encounter: Secondary | ICD-10-CM | POA: Diagnosis not present

## 2016-10-06 DIAGNOSIS — Z8679 Personal history of other diseases of the circulatory system: Secondary | ICD-10-CM

## 2016-10-06 DIAGNOSIS — I6200 Nontraumatic subdural hemorrhage, unspecified: Secondary | ICD-10-CM | POA: Diagnosis not present

## 2016-10-06 DIAGNOSIS — Z8781 Personal history of (healed) traumatic fracture: Secondary | ICD-10-CM

## 2016-10-06 DIAGNOSIS — S42291A Other displaced fracture of upper end of right humerus, initial encounter for closed fracture: Secondary | ICD-10-CM | POA: Diagnosis not present

## 2016-10-06 DIAGNOSIS — S0003XA Contusion of scalp, initial encounter: Secondary | ICD-10-CM

## 2016-10-06 DIAGNOSIS — S022XXD Fracture of nasal bones, subsequent encounter for fracture with routine healing: Secondary | ICD-10-CM | POA: Diagnosis not present

## 2016-10-06 DIAGNOSIS — S022XXA Fracture of nasal bones, initial encounter for closed fracture: Secondary | ICD-10-CM

## 2016-10-06 HISTORY — DX: Fracture of nasal bones, initial encounter for closed fracture: S02.2XXA

## 2016-10-06 HISTORY — DX: Personal history of (healed) traumatic fracture: Z87.81

## 2016-10-06 HISTORY — DX: Contusion of scalp, initial encounter: S00.03XA

## 2016-10-06 HISTORY — DX: Personal history of other diseases of the circulatory system: Z86.79

## 2016-10-06 LAB — BASIC METABOLIC PANEL
ANION GAP: 10 (ref 5–15)
BUN: 5 mg/dL — ABNORMAL LOW (ref 6–20)
CALCIUM: 9 mg/dL (ref 8.9–10.3)
CO2: 23 mmol/L (ref 22–32)
CREATININE: 0.47 mg/dL (ref 0.44–1.00)
Chloride: 102 mmol/L (ref 101–111)
GFR calc Af Amer: >60 mL/min (ref >60)
Glucose, Bld: 110 mg/dL — ABNORMAL HIGH (ref 65–99)
Potassium: 3.6 mmol/L (ref 3.5–5.1)
Sodium: 135 mmol/L (ref 135–145)

## 2016-10-06 LAB — CBC
HCT: 37.9 % (ref 36.0–46.0)
HEMOGLOBIN: 12.8 g/dL (ref 12.0–15.0)
MCH: 29.6 pg (ref 26.0–34.0)
MCHC: 33.8 g/dL (ref 30.0–36.0)
MCV: 87.7 fL (ref 78.0–100.0)
PLATELETS: 250 10*3/uL (ref 150–400)
RBC: 4.32 MIL/uL (ref 3.87–5.11)
RDW: 12.6 % (ref 11.5–15.5)
WBC: 12.8 10*3/uL — ABNORMAL HIGH (ref 4.0–10.5)

## 2016-10-06 MED ORDER — LATANOPROST 0.005 % OP SOLN
1.0000 [drp] | Freq: Every day | OPHTHALMIC | Status: DC
  Administered 2016-10-06: 1 [drp] via OPHTHALMIC
  Filled 2016-10-06: qty 2.5

## 2016-10-06 MED ORDER — DORZOLAMIDE HCL 2 % OP SOLN
1.0000 [drp] | Freq: Two times a day (BID) | OPHTHALMIC | Status: DC
  Administered 2016-10-06 – 2016-10-07 (×3): 1 [drp] via OPHTHALMIC
  Filled 2016-10-06: qty 10

## 2016-10-06 MED ORDER — DORZOLAMIDE HCL-TIMOLOL MAL 2-0.5 % OP SOLN
1.0000 [drp] | Freq: Two times a day (BID) | OPHTHALMIC | Status: DC
  Filled 2016-10-06: qty 10

## 2016-10-06 MED ORDER — LEVOTHYROXINE SODIUM 100 MCG PO TABS
100.0000 ug | ORAL_TABLET | Freq: Every day | ORAL | Status: DC
  Administered 2016-10-07: 100 ug via ORAL
  Filled 2016-10-06: qty 1

## 2016-10-06 MED ORDER — TIMOLOL MALEATE 0.5 % OP SOLN
1.0000 [drp] | Freq: Two times a day (BID) | OPHTHALMIC | Status: DC
  Administered 2016-10-06 – 2016-10-07 (×3): 1 [drp] via OPHTHALMIC
  Filled 2016-10-06: qty 5

## 2016-10-06 MED ORDER — LISINOPRIL 20 MG PO TABS
20.0000 mg | ORAL_TABLET | Freq: Every day | ORAL | Status: DC
  Administered 2016-10-06 – 2016-10-07 (×2): 20 mg via ORAL
  Filled 2016-10-06 (×2): qty 1

## 2016-10-06 NOTE — Progress Notes (Signed)
  Subjective: Mild HA and R shoulder pain, eating breakfast, wants to get UOB today.  Objective: Vital signs in last 24 hours: Temp:  [97.6 F (36.4 C)-98.9 F (37.2 C)] 98.2 F (36.8 C) (01/05 0800) Pulse Rate:  [75-88] 85 (01/05 0800) Resp:  [15-25] 25 (01/05 0800) BP: (135-184)/(58-93) 139/83 (01/05 0800) SpO2:  [94 %-99 %] 95 % (01/05 0800) Weight:  [74.3 kg (163 lb 12.8 oz)-75.8 kg (167 lb)] 74.3 kg (163 lb 12.8 oz) (01/04 2140) Last BM Date: 10/03/16  Intake/Output from previous day: 01/04 0701 - 01/05 0700 In: 807.5 [P.O.:720; I.V.:87.5] Out: 375 [Urine:375] Intake/Output this shift: Total I/O In: 10 [I.V.:10] Out: 275 [Urine:275]  General appearance: alert and cooperative Head: nasal, forehead and B periorbital ecchymoses Resp: clear to auscultation bilaterally Cardio: regular rate and rhythm GI: soft, NT, ND Neuro: A&O, F/C, speech fluent, mae  R shoulder tender, sling  Lab Results: CBC   Recent Labs  10/05/16 1910 10/06/16 0328  WBC 14.7* 12.8*  HGB 14.4 12.8  HCT 42.8 37.9  PLT 237 250   BMET  Recent Labs  10/05/16 1910 10/06/16 0328  NA 136 135  K 3.8 3.6  CL 104 102  CO2 22 23  GLUCOSE 143* 110*  BUN 9 5*  CREATININE 0.51 0.47  CALCIUM 9.3 9.0   PT/INR  Recent Labs  10/05/16 1910  LABPROT 14.2  INR 1.10   ABG No results for input(s): PHART, HCO3 in the last 72 hours.  Invalid input(s): PCO2, PO2 CT head 1/5 AM - IMPRESSION: Stable 13 mm LEFT falcotentorial subdural hematoma. Small amount of LEFT frontal convexity extra-axial blood products.  Large frontal scalp hematoma without skull fracture. Acute nasal bone fractures. Assessment/Plan: Ground level fall TBI/L falcine-tent SDH - Discussed with Dr. Saintclair Halsted this AM. F/U CT H stable as above, TBI team therapies. R humeral neck FX - sling, Dr. Marcelino Scot to see Nasal FX - Dr. Redmond Baseman consulted, may need CR, he will see in his office next week FEN - BMET in AM to check Na,  tolerating diet VTE - PAS DIspo - to floor, therapies. She lives with her husband and son. Her son can provide some assistance at D/C.   LOS: 0 days    Georganna Skeans, MD, MPH, FACS Trauma: [phone] General Surgery: [phone]  1/5/2018Patient ID: Linda Copeland, female   DOB: [birth year], 83 y.o.   MRN: [id number]

## 2016-10-06 NOTE — Evaluation (Signed)
Physical Therapy Evaluation Patient Details Name: Linda Copeland MRN: [id number] DOB: 1930/11/17 Today's Date: 10/06/2016   History of Present Illness  Patient is a 83 y/o female with hx of HTN and thyroid disease presents s/p fall sustaining nasal fx and Right humeral neck fx. Head CT-Stable 13 mm LEFT falcotentorial subdural hematoma.   Clinical Impression  Patient presents with generalized weakness, decreased movement LUE secondary to humeral fx and sling, imbalance and impaired mobility s/p above. Tolerated gait training with Min A for balance/safety as pt reaching for furniture for support. Sling throwing off balance. Pt caring for elderly husband at home. Will have some difficulty with functional tasks due to RUE in sling and fx. Pending order from Dr. Marcelino Scot. Reports her son is in and out at home so he can assist. Will try [facility] next session for balance. Will follow acutely to maximize independence and mobility prior to return home.     Follow Up Recommendations Home health PT;Supervision for mobility/OOB;Supervision/Assistance - 24 hour    Equipment Recommendations  Other (comment) (TBA)    Recommendations for Other Services       Precautions / Restrictions Precautions Precautions: Fall Precaution Comments: R UE ( pending Dr Marcelino Scot evaluation and recommendations) Restrictions Weight Bearing Restrictions: Yes RUE Weight Bearing: Non weight bearing      Mobility  Bed Mobility               General bed mobility comments: Pt standing in room with OT upon PT arrival.   Transfers Overall transfer level: Needs assistance Equipment used: None Transfers: Sit to/from Stand Sit to Stand: Min guard         General transfer comment: Min guard for safety to sit in chair.   Ambulation/Gait Ambulation/Gait assistance: Min assist Ambulation Distance (Feet): 175 Feet Assistive device: None Gait Pattern/deviations: Step-through pattern;Decreased step length - right;Decreased step  length - left;Staggering right;Staggering left Gait velocity: decreased   General Gait Details: Slow, unsteady gait reaching for furniture in hallway for support. Might try [facility] next session for balance.   Stairs            Wheelchair Mobility    Modified Rankin (Stroke Patients Only) Modified Rankin (Stroke Patients Only) Pre-Morbid Rankin Score: Slight disability Modified Rankin: Moderately severe disability     Balance Overall balance assessment: Needs assistance Sitting-balance support: Feet supported;No upper extremity supported Sitting balance-Leahy Scale: Fair     Standing balance support: During functional activity Standing balance-Leahy Scale: Fair Standing balance comment: Able to stand statically without assist but furniture walkng during ambulation.                             Pertinent Vitals/Pain Pain Assessment: No/denies pain    Home Living Family/patient expects to be discharged to:: Private residence Living Arrangements: Spouse/significant other Available Help at Discharge: Family;Available PRN/intermittently Type of Home: Apartment Home Access: Stairs to enter;Other (comment) Entrance Stairs-Rails: None Entrance Stairs-Number of Steps: 2 Home Layout: One level Home Equipment: None      Prior Function Level of Independence: Independent         Comments: driving/ (A) spouse with his ADLS     Hand Dominance   Dominant Hand: Right    Extremity/Trunk Assessment   Upper Extremity Assessment Upper Extremity Assessment: Defer to OT evaluation (RUE in sling, digit AROm WFL.)    Lower Extremity Assessment Lower Extremity Assessment: Generalized weakness    Cervical / Trunk Assessment Cervical /  Trunk Assessment: Kyphotic  Communication   Communication: No difficulties  Cognition Arousal/Alertness: Awake/alert Behavior During Therapy: WFL for tasks assessed/performed Overall Cognitive Status: Within Functional Limits for  tasks assessed                      General Comments General comments (skin integrity, edema, etc.): VSS. Reports some difficulty with breathing due to nasal fx.    Exercises     Assessment/Plan    PT Assessment Patient needs continued PT services  PT Problem List Decreased strength;Decreased mobility;Decreased activity tolerance;Decreased balance;Cardiopulmonary status limiting activity;Decreased knowledge of precautions          PT Treatment Interventions DME instruction;Therapeutic activities;Gait training;Patient/family education;Therapeutic exercise;Stair training;Balance training;Functional mobility training    PT Goals (Current goals can be found in the Care Plan section)  Acute Rehab PT Goals Patient Stated Goal: to get home PT Goal Formulation: With patient Time For Goal Achievement: 10/20/16 Potential to Achieve Goals: Good    Frequency Min 3X/week   Barriers to discharge Decreased caregiver support      Co-evaluation               End of Session Equipment Utilized During Treatment: Gait belt Activity Tolerance: Patient tolerated treatment well Patient left: in chair;with call bell/phone within reach Nurse Communication: Mobility status    Functional Assessment Tool Used: clinical judgment Functional Limitation: Mobility: Walking and moving around Mobility: Walking and Moving Around Current Status [phone]): At least 20 percent but less than 40 percent impaired, limited or restricted Mobility: Walking and Moving Around Goal Status [phone]): At least 1 percent but less than 20 percent impaired, limited or restricted    Time: 0956-1011 PT Time Calculation (min) (ACUTE ONLY): 15 min   Charges:   PT Evaluation $PT Eval Moderate Complexity: 1 Procedure     PT G Codes:   PT G-Codes **NOT FOR INPATIENT CLASS** Functional Assessment Tool Used: clinical judgment Functional Limitation: Mobility: Walking and moving around Mobility: Walking and  Moving Around Current Status [phone]): At least 20 percent but less than 40 percent impaired, limited or restricted Mobility: Walking and Moving Around Goal Status [phone]): At least 1 percent but less than 20 percent impaired, limited or restricted    [place] 10/06/2016, 11:17 AM Wray Kearns, PT, DPT [phone]

## 2016-10-06 NOTE — Consult Note (Signed)
Orthopaedic Trauma Service Consult     Chief Complaint:  R proximal humerus fracture  HPI:   Linda Copeland is an 83 y.o. RHD female who sustained a ground level fall on 10/05/2016. Brought to [place]. Found to have SDH, R proximal humerus fracture and nasal fracture. Admitted to trauma service.   Pt c/o some mild R shoulder pain. Denies numbness or tingling   Past Medical History:  Diagnosis Date  . Hypertension   . Thyroid disease     History reviewed. No pertinent surgical history.  History reviewed. No pertinent family history. Social History:  reports that she has never smoked. She has never used smokeless tobacco. She reports that she does not drink alcohol or use drugs.  Allergies:  Allergies  Allergen Reactions  . Codeine Nausea And Vomiting    Medications Prior to Admission  Medication Sig Dispense Refill  . aspirin EC 81 MG tablet Take 81 mg by mouth daily.    . bimatoprost (LUMIGAN) 0.03 % ophthalmic solution Place 1 drop into both eyes at bedtime.    . dorzolamide-timolol (COSOPT) 22.3-6.8 MG/ML ophthalmic solution Place 1 drop into the right eye 2 (two) times daily.    . fluticasone (FLONASE) 50 MCG/ACT nasal spray Place 2 sprays into both nostrils daily as needed for allergies.    Marland Kitchen levothyroxine (SYNTHROID, LEVOTHROID) 100 MCG tablet Take 100 mcg by mouth daily before breakfast.    . lisinopril (PRINIVIL,ZESTRIL) 20 MG tablet Take 20 mg by mouth daily.      Results for orders placed or performed during the hospital encounter of 10/05/16 (from the past 48 hour(s))  Basic metabolic panel     Status: Abnormal   Collection Time: 10/05/16  7:10 PM  Result Value Ref Range   Sodium 136 135 - 145 mmol/L   Potassium 3.8 3.5 - 5.1 mmol/L   Chloride 104 101 - 111 mmol/L   CO2 22 22 - 32 mmol/L   Glucose, Bld 143 (H) 65 - 99 mg/dL   BUN 9 6 - 20 mg/dL   Creatinine, Ser 0.51 0.44 - 1.00 mg/dL   Calcium 9.3 8.9 - 10.3 mg/dL   GFR calc non Af Amer >60 >60 mL/min    GFR calc Af Amer >60 >60 mL/min    Comment: (NOTE) The eGFR has been calculated using the CKD EPI equation. This calculation has not been validated in all clinical situations. eGFR's persistently <60 mL/min signify possible Chronic Kidney Disease.    Anion gap 10 5 - 15  CBC with Differential     Status: Abnormal   Collection Time: 10/05/16  7:10 PM  Result Value Ref Range   WBC 14.7 (H) 4.0 - 10.5 K/uL   RBC 4.81 3.87 - 5.11 MIL/uL   Hemoglobin 14.4 12.0 - 15.0 g/dL   HCT 42.8 36.0 - 46.0 %   MCV 89.0 78.0 - 100.0 fL   MCH 29.9 26.0 - 34.0 pg   MCHC 33.6 30.0 - 36.0 g/dL   RDW 12.6 11.5 - 15.5 %   Platelets 237 150 - 400 K/uL   Neutrophils Relative % 86 %   Neutro Abs 12.7 (H) 1.7 - 7.7 K/uL   Lymphocytes Relative 10 %   Lymphs Abs 1.4 0.7 - 4.0 K/uL   Monocytes Relative 4 %   Monocytes Absolute 0.5 0.1 - 1.0 K/uL   Eosinophils Relative 0 %   Eosinophils Absolute 0.0 0.0 - 0.7 K/uL   Basophils Relative 0 %   Basophils Absolute  0.0 0.0 - 0.1 K/uL  Protime-INR     Status: None   Collection Time: 10/05/16  7:10 PM  Result Value Ref Range   Prothrombin Time 14.2 11.4 - 15.2 seconds   INR 9.56   Basic metabolic panel     Status: Abnormal   Collection Time: 10/06/16  3:28 AM  Result Value Ref Range   Sodium 135 135 - 145 mmol/L   Potassium 3.6 3.5 - 5.1 mmol/L   Chloride 102 101 - 111 mmol/L   CO2 23 22 - 32 mmol/L   Glucose, Bld 110 (H) 65 - 99 mg/dL   BUN 5 (L) 6 - 20 mg/dL   Creatinine, Ser 0.47 0.44 - 1.00 mg/dL   Calcium 9.0 8.9 - 10.3 mg/dL   GFR calc non Af Amer >60 >60 mL/min   GFR calc Af Amer >60 >60 mL/min    Comment: (NOTE) The eGFR has been calculated using the CKD EPI equation. This calculation has not been validated in all clinical situations. eGFR's persistently <60 mL/min signify possible Chronic Kidney Disease.    Anion gap 10 5 - 15  CBC     Status: Abnormal   Collection Time: 10/06/16  3:28 AM  Result Value Ref Range   WBC 12.8 (H) 4.0 - 10.5  K/uL   RBC 4.32 3.87 - 5.11 MIL/uL   Hemoglobin 12.8 12.0 - 15.0 g/dL   HCT 37.9 36.0 - 46.0 %   MCV 87.7 78.0 - 100.0 fL   MCH 29.6 26.0 - 34.0 pg   MCHC 33.8 30.0 - 36.0 g/dL   RDW 12.6 11.5 - 15.5 %   Platelets 250 150 - 400 K/uL   Dg Shoulder Right  Result Date: 10/05/2016 CLINICAL DATA:  Right shoulder pain and deformity after fall. Is fallen are EXAM: RIGHT SHOULDER - 2+ VIEW COMPARISON:  None. FINDINGS: There is an acute, closed, transverse fracture through the surgical neck of the humerus. Equivocal greater tuberosity fracture suggested on the AP view of the shoulder not confirmed however on the scapular Y-views. No significant angulation or displacement. The scapula, AC joint, visualized right ribs and lung are unremarkable. No malalignment of the glenohumeral joint. IMPRESSION: Neer category 1 part fracture of the surgical neck of the right humerus without significant angulation nor displacement. Equivocal greater tuberosity fracture is also suggested on the AP view but not confirmed on the lateral projections. Electronically Signed   By: [name] M.D.   On: 10/05/2016 18:35   Ct Head Without Contrast  Result Date: 10/06/2016 CLINICAL DATA:  Followup subdural hematoma. EXAM: CT HEAD WITHOUT CONTRAST TECHNIQUE: Contiguous axial images were obtained from the base of the skull through the vertex without intravenous contrast. COMPARISON:  CT HEAD October 05, 2016 FINDINGS: BRAIN: The ventricles and sulci are normal for age, similar asymmetrically prominent RIGHT ventricle atrium. No intraparenchymal hemorrhage, mass effect nor midline shift. Patchy supratentorial white matter hypodensities within normal range for patient's age, though non-specific are most compatible with chronic small vessel ischemic disease. No acute large vascular territory infarcts. Similar dense 13 mm at LEFT parafalcine subdural hematoma extending to LEFT cerebellar tentorium. Small LEFT frontal convexity extra-axial  blood products are similar. Basal cisterns are patent. VASCULAR: Moderate calcific atherosclerosis of the carotid siphons. SKULL: No skull fracture. Acute bilateral nasal bone fractures. Large bifrontal scalp hematoma without subcutaneous gas or radiopaque foreign bodies. SINUSES/ORBITS: The mastoid air-cells and included paranasal sinuses are well-aerated.Status post bilateral ocular lens implants. The included ocular globes and  orbital contents are non-suspicious. OTHER: Patient is edentulous. IMPRESSION: Stable 13 mm LEFT falcotentorial subdural hematoma. Small amount of LEFT frontal convexity extra-axial blood products. Large frontal scalp hematoma without skull fracture. Acute nasal bone fractures. Electronically Signed   By: Elon Alas M.D.   On: 10/06/2016 04:36   Ct Head Wo Contrast  Addendum Date: 10/05/2016   ADDENDUM REPORT: 10/05/2016 18:47 ADDENDUM: Critical Value/emergent results were called by telephone at the time of interpretation on 10/05/2016 at 1830 hours to Dr. Theotis Burrow , who verbally acknowledged these results. Electronically Signed   By: [name] M.D.   On: 10/05/2016 18:47   Result Date: 10/05/2016 CLINICAL DATA:  83 year old female status post trip and fall on rough concrete outside this afternoon. Face and nose laceration. Ecchymoses. Initial encounter. EXAM: CT HEAD WITHOUT CONTRAST CT MAXILLOFACIAL WITHOUT CONTRAST CT CERVICAL SPINE WITHOUT CONTRAST TECHNIQUE: Multidetector CT imaging of the head, cervical spine, and maxillofacial structures were performed using the standard protocol without intravenous contrast. Multiplanar CT image reconstructions of the cervical spine and maxillofacial structures were also generated. COMPARISON:  Cervical spine radiographs 03/26/2005. FINDINGS: CT HEAD FINDINGS Brain: Subdural hematoma along the interhemispheric fissure measuring up to 15 mm in thickness. This is associated with globular left superior parasagittal hyperdensity measuring  10-12 mm is over the left superior frontal gyrus as seen on series 203, image 35. Mild regional mass effect. No definite cerebral edema. Subdural hematoma tracks along the left tentorium measuring up to 2-3 mm in thickness there. There is also trace left lateral convexity subdural hematoma most visible at the operculum and measuring 2-3 mm. No peripheral right side subdural. No other extra-axial hemorrhage identified. No midline shift. No ventriculomegaly. No cortically based acute infarct identified. Vascular: Dominant distal left vertebral artery. Calcified atherosclerosis at the skull base. Skull: Hyperostosis of the calvarium (normal variant). No calvarium fracture identified. Facial fractures described below. Other: Anterior scalp and forehead superficial hematoma and subcutaneous gas in keeping with lacerations. The posterior scalp soft tissues appear normal. See facial findings below. CT MAXILLOFACIAL FINDINGS Osseous: Mandible intact.  No maxilla or zygoma fracture. Bilateral nasal bone fractures, mostly nondisplaced on the left and comminuted and mildly displaced on the right (series 302, image 37). There is also leftward deviation and angulation of the anterior bony nasal septum which could be acute. No central skullbase fracture. Orbits: No orbital wall fracture. Globes are intact. Post cataract surgery changes. No intraorbital hematoma or contusion. Sinuses: Mild bubbly opacity in the right sphenoid sinus. The other paranasal sinuses are clear. There is a small volume of blood or retained secretions in the nasal cavity. Tympanic cavities and mastoids are clear. Soft tissues: Soft tissue swelling overlying the knee nose. Broad-based forehead hematoma measuring up to 6 mm in thickness. Superimposed subcutaneous gas in keeping with associated lacerations. The lateral and more inferior face superficial soft tissues appear normal. Small volume retained secretions in the oropharynx. The adenoid soft tissue may  be surgically absent. There is bulky lingual tonsillar hypertrophy. There does appear to be bilateral palatine tonsillar tissue present, with postinflammatory calcifications on the left. Negative visualized parapharyngeal, retropharyngeal and sublingual spaces. The submandibular glands appear to be atrophied. Negative parotid glands. Coarsely calcified right level 2/level 3 lymph node. No superior neck lymphadenopathy. CT CERVICAL SPINE FINDINGS Alignment: Exaggerated upper cervical lordosis and straightening of lower cervical lordosis. Trace anterolisthesis of C6 on C7, and similar anterolisthesis of C7 on T1. These appear degenerative. There is ankylosis of the C5-C6 level which  may be congenitally incompletely segmented. Bilateral posterior element alignment is within normal limits. Skull base and vertebrae: Degenerative changes at the anterior C1 odontoid and also the left lateral C1-C2 and occipital condyles articulations. Bulky ligamentous hypertrophy about the odontoid. Visualized skull base is intact. No atlanto-occipital dissociation. No acute cervical spine fracture identified. Soft tissues and spinal canal: No prevertebral fluid or swelling. No visible canal hematoma. Postinflammatory appearing densely calcified right neck lymph node. There is bilateral level 2 and/or level 3 lymph node enlargement up to 10-11 mm mm short axis. The bulky lingual and to a lesser extent palatine tonsillar enlargement is again noted. No lower cervical lymphadenopathy. Negative retropharyngeal space. Incidental retropharyngeal course of both common carotid arteries. Negative larynx. Diminutive thyroid. Bilateral carotid artery bifurcation. Disc levels: Possible mild degenerative cervical spinal stenosis at C4-C5. Widespread facet arthropathy. Upper chest: Mild respiratory motion artifact but negative visualized lung apices. Calcified aortic atherosclerosis. Visualized upper thoracic levels appear grossly intact. IMPRESSION:  1. Positive for acute intracranial hemorrhage. Left side predominantly parafalcine subdural hematoma measuring up to 15 in thickness along the interhemispheric fissure. Left tentorial and lateral subdural component measuring 2-3 mm in thickness. No significant intracranial mass effect or midline shift at this time. 2. No other acute traumatic injury to the brain identified. 3. Right greater than left nasal bone fractures. Possible associated anterior nasal septal fracture. Overlying soft tissue swelling. 4. Anterior forehead and scalp soft tissue injury without underlying fracture. 5. No acute fracture or listhesis identified in the cervical spine. Advanced skullbase and cervical spine degenerative changes. 6. Nonspecific mild to moderate bilateral level 2/3 cervical lymphadenopathy in association with bulky enlargement of the lingual tonsil. Mild asymmetry of the palatine tonsils. Recommend outpatient follow-up neck CT with IV contrast to evaluate further. 7.  Calcified aortic atherosclerosis. Electronically Signed: By: [name] M.D. On: 10/05/2016 18:28   Ct Cervical Spine Wo Contrast  Addendum Date: 10/05/2016   ADDENDUM REPORT: 10/05/2016 18:47 ADDENDUM: Critical Value/emergent results were called by telephone at the time of interpretation on 10/05/2016 at 1830 hours to Dr. Theotis Burrow , who verbally acknowledged these results. Electronically Signed   By: [name] M.D.   On: 10/05/2016 18:47   Result Date: 10/05/2016 CLINICAL DATA:  83 year old female status post trip and fall on rough concrete outside this afternoon. Face and nose laceration. Ecchymoses. Initial encounter. EXAM: CT HEAD WITHOUT CONTRAST CT MAXILLOFACIAL WITHOUT CONTRAST CT CERVICAL SPINE WITHOUT CONTRAST TECHNIQUE: Multidetector CT imaging of the head, cervical spine, and maxillofacial structures were performed using the standard protocol without intravenous contrast. Multiplanar CT image reconstructions of the cervical spine and  maxillofacial structures were also generated. COMPARISON:  Cervical spine radiographs 03/26/2005. FINDINGS: CT HEAD FINDINGS Brain: Subdural hematoma along the interhemispheric fissure measuring up to 15 mm in thickness. This is associated with globular left superior parasagittal hyperdensity measuring 10-12 mm is over the left superior frontal gyrus as seen on series 203, image 35. Mild regional mass effect. No definite cerebral edema. Subdural hematoma tracks along the left tentorium measuring up to 2-3 mm in thickness there. There is also trace left lateral convexity subdural hematoma most visible at the operculum and measuring 2-3 mm. No peripheral right side subdural. No other extra-axial hemorrhage identified. No midline shift. No ventriculomegaly. No cortically based acute infarct identified. Vascular: Dominant distal left vertebral artery. Calcified atherosclerosis at the skull base. Skull: Hyperostosis of the calvarium (normal variant). No calvarium fracture identified. Facial fractures described below. Other: Anterior scalp and forehead  superficial hematoma and subcutaneous gas in keeping with lacerations. The posterior scalp soft tissues appear normal. See facial findings below. CT MAXILLOFACIAL FINDINGS Osseous: Mandible intact.  No maxilla or zygoma fracture. Bilateral nasal bone fractures, mostly nondisplaced on the left and comminuted and mildly displaced on the right (series 302, image 37). There is also leftward deviation and angulation of the anterior bony nasal septum which could be acute. No central skullbase fracture. Orbits: No orbital wall fracture. Globes are intact. Post cataract surgery changes. No intraorbital hematoma or contusion. Sinuses: Mild bubbly opacity in the right sphenoid sinus. The other paranasal sinuses are clear. There is a small volume of blood or retained secretions in the nasal cavity. Tympanic cavities and mastoids are clear. Soft tissues: Soft tissue swelling overlying  the knee nose. Broad-based forehead hematoma measuring up to 6 mm in thickness. Superimposed subcutaneous gas in keeping with associated lacerations. The lateral and more inferior face superficial soft tissues appear normal. Small volume retained secretions in the oropharynx. The adenoid soft tissue may be surgically absent. There is bulky lingual tonsillar hypertrophy. There does appear to be bilateral palatine tonsillar tissue present, with postinflammatory calcifications on the left. Negative visualized parapharyngeal, retropharyngeal and sublingual spaces. The submandibular glands appear to be atrophied. Negative parotid glands. Coarsely calcified right level 2/level 3 lymph node. No superior neck lymphadenopathy. CT CERVICAL SPINE FINDINGS Alignment: Exaggerated upper cervical lordosis and straightening of lower cervical lordosis. Trace anterolisthesis of C6 on C7, and similar anterolisthesis of C7 on T1. These appear degenerative. There is ankylosis of the C5-C6 level which may be congenitally incompletely segmented. Bilateral posterior element alignment is within normal limits. Skull base and vertebrae: Degenerative changes at the anterior C1 odontoid and also the left lateral C1-C2 and occipital condyles articulations. Bulky ligamentous hypertrophy about the odontoid. Visualized skull base is intact. No atlanto-occipital dissociation. No acute cervical spine fracture identified. Soft tissues and spinal canal: No prevertebral fluid or swelling. No visible canal hematoma. Postinflammatory appearing densely calcified right neck lymph node. There is bilateral level 2 and/or level 3 lymph node enlargement up to 10-11 mm mm short axis. The bulky lingual and to a lesser extent palatine tonsillar enlargement is again noted. No lower cervical lymphadenopathy. Negative retropharyngeal space. Incidental retropharyngeal course of both common carotid arteries. Negative larynx. Diminutive thyroid. Bilateral carotid artery  bifurcation. Disc levels: Possible mild degenerative cervical spinal stenosis at C4-C5. Widespread facet arthropathy. Upper chest: Mild respiratory motion artifact but negative visualized lung apices. Calcified aortic atherosclerosis. Visualized upper thoracic levels appear grossly intact. IMPRESSION: 1. Positive for acute intracranial hemorrhage. Left side predominantly parafalcine subdural hematoma measuring up to 15 in thickness along the interhemispheric fissure. Left tentorial and lateral subdural component measuring 2-3 mm in thickness. No significant intracranial mass effect or midline shift at this time. 2. No other acute traumatic injury to the brain identified. 3. Right greater than left nasal bone fractures. Possible associated anterior nasal septal fracture. Overlying soft tissue swelling. 4. Anterior forehead and scalp soft tissue injury without underlying fracture. 5. No acute fracture or listhesis identified in the cervical spine. Advanced skullbase and cervical spine degenerative changes. 6. Nonspecific mild to moderate bilateral level 2/3 cervical lymphadenopathy in association with bulky enlargement of the lingual tonsil. Mild asymmetry of the palatine tonsils. Recommend outpatient follow-up neck CT with IV contrast to evaluate further. 7.  Calcified aortic atherosclerosis. Electronically Signed: By: [name] M.D. On: 10/05/2016 18:28   Ct Maxillofacial Wo Cm  Addendum Date: 10/05/2016  ADDENDUM REPORT: 10/05/2016 18:47 ADDENDUM: Critical Value/emergent results were called by telephone at the time of interpretation on 10/05/2016 at 1830 hours to Dr. Theotis Burrow , who verbally acknowledged these results. Electronically Signed   By: [name] M.D.   On: 10/05/2016 18:47   Result Date: 10/05/2016 CLINICAL DATA:  83 year old female status post trip and fall on rough concrete outside this afternoon. Face and nose laceration. Ecchymoses. Initial encounter. EXAM: CT HEAD WITHOUT CONTRAST CT  MAXILLOFACIAL WITHOUT CONTRAST CT CERVICAL SPINE WITHOUT CONTRAST TECHNIQUE: Multidetector CT imaging of the head, cervical spine, and maxillofacial structures were performed using the standard protocol without intravenous contrast. Multiplanar CT image reconstructions of the cervical spine and maxillofacial structures were also generated. COMPARISON:  Cervical spine radiographs 03/26/2005. FINDINGS: CT HEAD FINDINGS Brain: Subdural hematoma along the interhemispheric fissure measuring up to 15 mm in thickness. This is associated with globular left superior parasagittal hyperdensity measuring 10-12 mm is over the left superior frontal gyrus as seen on series 203, image 35. Mild regional mass effect. No definite cerebral edema. Subdural hematoma tracks along the left tentorium measuring up to 2-3 mm in thickness there. There is also trace left lateral convexity subdural hematoma most visible at the operculum and measuring 2-3 mm. No peripheral right side subdural. No other extra-axial hemorrhage identified. No midline shift. No ventriculomegaly. No cortically based acute infarct identified. Vascular: Dominant distal left vertebral artery. Calcified atherosclerosis at the skull base. Skull: Hyperostosis of the calvarium (normal variant). No calvarium fracture identified. Facial fractures described below. Other: Anterior scalp and forehead superficial hematoma and subcutaneous gas in keeping with lacerations. The posterior scalp soft tissues appear normal. See facial findings below. CT MAXILLOFACIAL FINDINGS Osseous: Mandible intact.  No maxilla or zygoma fracture. Bilateral nasal bone fractures, mostly nondisplaced on the left and comminuted and mildly displaced on the right (series 302, image 37). There is also leftward deviation and angulation of the anterior bony nasal septum which could be acute. No central skullbase fracture. Orbits: No orbital wall fracture. Globes are intact. Post cataract surgery changes. No  intraorbital hematoma or contusion. Sinuses: Mild bubbly opacity in the right sphenoid sinus. The other paranasal sinuses are clear. There is a small volume of blood or retained secretions in the nasal cavity. Tympanic cavities and mastoids are clear. Soft tissues: Soft tissue swelling overlying the knee nose. Broad-based forehead hematoma measuring up to 6 mm in thickness. Superimposed subcutaneous gas in keeping with associated lacerations. The lateral and more inferior face superficial soft tissues appear normal. Small volume retained secretions in the oropharynx. The adenoid soft tissue may be surgically absent. There is bulky lingual tonsillar hypertrophy. There does appear to be bilateral palatine tonsillar tissue present, with postinflammatory calcifications on the left. Negative visualized parapharyngeal, retropharyngeal and sublingual spaces. The submandibular glands appear to be atrophied. Negative parotid glands. Coarsely calcified right level 2/level 3 lymph node. No superior neck lymphadenopathy. CT CERVICAL SPINE FINDINGS Alignment: Exaggerated upper cervical lordosis and straightening of lower cervical lordosis. Trace anterolisthesis of C6 on C7, and similar anterolisthesis of C7 on T1. These appear degenerative. There is ankylosis of the C5-C6 level which may be congenitally incompletely segmented. Bilateral posterior element alignment is within normal limits. Skull base and vertebrae: Degenerative changes at the anterior C1 odontoid and also the left lateral C1-C2 and occipital condyles articulations. Bulky ligamentous hypertrophy about the odontoid. Visualized skull base is intact. No atlanto-occipital dissociation. No acute cervical spine fracture identified. Soft tissues and spinal canal: No prevertebral fluid  or swelling. No visible canal hematoma. Postinflammatory appearing densely calcified right neck lymph node. There is bilateral level 2 and/or level 3 lymph node enlargement up to 10-11 mm  mm short axis. The bulky lingual and to a lesser extent palatine tonsillar enlargement is again noted. No lower cervical lymphadenopathy. Negative retropharyngeal space. Incidental retropharyngeal course of both common carotid arteries. Negative larynx. Diminutive thyroid. Bilateral carotid artery bifurcation. Disc levels: Possible mild degenerative cervical spinal stenosis at C4-C5. Widespread facet arthropathy. Upper chest: Mild respiratory motion artifact but negative visualized lung apices. Calcified aortic atherosclerosis. Visualized upper thoracic levels appear grossly intact. IMPRESSION: 1. Positive for acute intracranial hemorrhage. Left side predominantly parafalcine subdural hematoma measuring up to 15 in thickness along the interhemispheric fissure. Left tentorial and lateral subdural component measuring 2-3 mm in thickness. No significant intracranial mass effect or midline shift at this time. 2. No other acute traumatic injury to the brain identified. 3. Right greater than left nasal bone fractures. Possible associated anterior nasal septal fracture. Overlying soft tissue swelling. 4. Anterior forehead and scalp soft tissue injury without underlying fracture. 5. No acute fracture or listhesis identified in the cervical spine. Advanced skullbase and cervical spine degenerative changes. 6. Nonspecific mild to moderate bilateral level 2/3 cervical lymphadenopathy in association with bulky enlargement of the lingual tonsil. Mild asymmetry of the palatine tonsils. Recommend outpatient follow-up neck CT with IV contrast to evaluate further. 7.  Calcified aortic atherosclerosis. Electronically Signed: By: [name] M.D. On: 10/05/2016 18:28    ROS As above in HPI  Blood pressure (!) 142/68, pulse 78, temperature 98.3 F (36.8 C), temperature source Oral, resp. rate (!) 23, height 5' (1.524 m), weight 74.3 kg (163 lb 12.8 oz), SpO2 96 %. Physical Exam  Constitutional: She is cooperative. No distress.   Musculoskeletal:  Right upper extremity  Inspection:    No gross deformities to shoulder    No ecchymosis to shoulder noted    Elbow, forearm, wrist unremarkable    Chronic deformity to R thumb IP [place] eval:   TTP R proximal humerus   Clavicle nontender   Elbow with vague tenderness   Forearm, wrist and hand are nontender    Soft tissue:   No significant swelling noted    No ecchymosis to shoulder     ROM:   Good elbow, foreamr, wrist and hand motion   Sensation:   Radial, ulnar, median, ax nv sensation intact Motor:  R/U/M/AIN/PIN motor intact  Vascular:   Ext warm   Skin with good color   + radial pulse   Left upper extremity     shoulder, elbow, wrist, digits- no skin wounds, nontender, no instability, no blocks to motion  Sens  Ax/R/M/U intact  Mot   Ax/ R/ PIN/ M/ AIN/ U intact  Rad 2+   Bilateral lower extremties  No traumatic wounds, rash             Small contusion L knee  Hips and pelvis nontender             No pain with axial loading or log rolling of hips   No effusions  Knee stable to varus/ valgus and anterior/posterior stress  Sens DPN, SPN, TN intact  Motor EHL, ext, flex, evers 5/5  DP 2+, PT 2+, No significant edema       Neurological: She is alert.  Nursing note and vitals reviewed.     Assessment/Plan  83 y/o RHD female s/p fall with SDH, R  proximal humerus fracture and nasal fracture   - fall  -R proximal humerus fracture  Plain films look good, however concerned that greater tuberosity is a free fragment.   Will check CT scan    If it is a large free fragment it may benefit pt to address surgically. However if this is a true 2-part proximal humerus fracture we can manage non-operatively    NWB R arm  Sling at all times   Can come out of sling periodically to move elbow to prevent stiffness  Gentle shoulder pendulums  PT/OT  Ice    - Dispo:  Continue per TS  Will follow  If pt dc'd over weekend please have her follow  at office in 3-5 days   Jari Pigg, PA-C [facility] [phone] (P) 10/06/2016, 1:01 PM

## 2016-10-06 NOTE — Evaluation (Addendum)
Occupational Therapy Evaluation Patient Details Name: Linda Copeland MRN: [id number] DOB: 04/25/1931 Today's Date: 10/06/2016    History of Present Illness Patient is a 83 y/o female with hx of HTN and thyroid disease presents s/p fall sustaining nasal fx and Right humeral neck fx. Head CT-Stable 13 mm LEFT falcotentorial subdural hematoma.    Clinical Impression   PT admitted with s/p fall with R proximal humerus fx and nasal fx in addition to SDH. Pt currently with functional limitiations due to the deficits listed below (see OT problem list). PTA was independent with all adls and spouse requires (A) for his adls.  Pt will benefit from skilled OT to increase their independence and safety with adls and balance to allow discharge [place] with aide or snf level care. Pt currently will be unable to complete self care without A() and will not be able to provide for spouse.  Pt has son at the home but reports he has to work because he has to have the money so she will be there with spouse who is in poor health.      Follow Up Recommendations  Home health OT;SNF (but likely to need SNF)    Equipment Recommendations  3 in 1 bedside commode;Other (comment)    Recommendations for Other Services Other (comment) (home aide)     Precautions / Restrictions Precautions Precautions: Fall Precaution Comments: R UE allowed pendulums and elbow flexion per Dr Marcelino Scot Restrictions RUE Weight Bearing: Non weight bearing      Mobility Bed Mobility Overal bed mobility: Needs Assistance Bed Mobility: Supine to Sit     Supine to sit: Mod assist     General bed mobility comments: Required (A) to sequence to EOB  Transfers Overall transfer level: Needs assistance Equipment used: None Transfers: Sit to/from Stand Sit to Stand: Min guard         General transfer comment: Min guard for safety to sit in chair.     Balance Overall balance assessment: Needs assistance Sitting-balance support:  Feet supported;No upper extremity supported Sitting balance-Leahy Scale: Fair     Standing balance support: During functional activity Standing balance-Leahy Scale: Fair Standing balance comment: Able to stand statically without assist but furniture walkng during ambulation.                            ADL Overall ADL's : Needs assistance/impaired Eating/Feeding: Minimal assistance;Sitting Eating/Feeding Details (indicate cue type and reason): pt attempting to self feed with L UE Grooming: Wash/dry hands;Wash/dry face;Minimal assistance;Sitting   Upper Body Bathing: Moderate assistance   Lower Body Bathing: Maximal assistance           Toilet Transfer: Minimal assistance   Toileting- Clothing Manipulation and Hygiene: Maximal assistance       Functional mobility during ADLs: Minimal assistance       Vision     Perception     Praxis      Pertinent Vitals/Pain Pain Assessment: No/denies pain     Hand Dominance Right   Extremity/Trunk Assessment Upper Extremity Assessment Upper Extremity Assessment: RUE deficits/detail RUE Deficits / Details: Sling and requires sling at all times   Lower Extremity Assessment Lower Extremity Assessment: Defer to PT evaluation   Cervical / Trunk Assessment Cervical / Trunk Assessment: Kyphotic   Communication Communication Communication: No difficulties   Cognition Arousal/Alertness: Awake/alert Behavior During Therapy: WFL for tasks assessed/performed Overall Cognitive Status: Impaired/Different from baseline Area of Impairment: Safety/judgement;Awareness  Awareness: Anticipatory   General Comments: Pt currently unable to use R UE and normally (A) spouse with all adls. Pt will need (A) with personal adls   General Comments       Exercises       Shoulder Instructions      Home Living Family/patient expects to be discharged to:: Private residence Living Arrangements: Spouse/significant  other Available Help at Discharge: Family;Available PRN/intermittently Type of Home: Apartment Home Access: Stairs to enter;Other (comment) Entrance Stairs-Number of Steps: 2 Entrance Stairs-Rails: None Home Layout: One level     Bathroom Shower/Tub: Teacher, early years/pre: Standard     Home Equipment: None      Lives With: Spouse    Prior Functioning/Environment Level of Independence: Independent        Comments: driving/ (A) spouse with his ADLS        OT Problem List: Decreased strength;Decreased activity tolerance;Impaired balance (sitting and/or standing);Impaired vision/perception;Decreased coordination;Decreased safety awareness;Decreased knowledge of use of DME or AE;Decreased knowledge of precautions;Pain;Obesity;Impaired UE functional use   OT Treatment/Interventions: Self-care/ADL training;Therapeutic exercise;DME and/or AE instruction;Therapeutic activities;Cognitive remediation/compensation;Visual/perceptual remediation/compensation;Patient/family education;Balance training    OT Goals(Current goals can be found in the care plan section) Acute Rehab OT Goals Patient Stated Goal: to get home OT Goal Formulation: With patient Time For Goal Achievement: 10/20/16 Potential to Achieve Goals: Good  OT Frequency: Min 3X/week   Barriers to D/C: Decreased caregiver support          Co-evaluation              End of Session Equipment Utilized During Treatment: Gait belt Nurse Communication: Mobility status;Precautions  Activity Tolerance: Patient tolerated treatment well Patient left: Other (comment) (passed to PT Shauna at door way)   Time: [id number] OT Time Calculation (min): 21 min Charges:  OT General Charges $OT Visit: 1 Procedure OT Evaluation $OT Eval Moderate Complexity: 1 Procedure G-Codes:    Peri Maris 11-02-2016, 5:03 PM  Jeri Modena   OTR/L Pager: [phone] Office: [phone] .

## 2016-10-06 NOTE — Evaluation (Signed)
Speech Language Pathology Evaluation Patient Details Name: Linda Copeland MRN: [id number] DOB: [birth year] Today's Date: 10/06/2016 Time: [id number] SLP Time Calculation (min) (ACUTE ONLY): 21 min  Problem List:  Patient Active Problem List   Diagnosis Date Noted  . Subdural hematoma ([place]) 10/05/2016   Past Medical History:  Past Medical History:  Diagnosis Date  . Hypertension   . Thyroid disease    Past Surgical History: History reviewed. No pertinent surgical history. HPI:  Patient is a 83 y/o female with hx of HTN and thyroid disease presents s/p fall sustaining nasal fx and Right humeral neck fx. Head CT-Stable 13 mm LEFT falcotentorial subdural hematoma.    Assessment / Plan / Recommendation Clinical Impression  Pt administered the Cognistat where she scored within normal limits on all subtests except memory and reasoning. She is responsible for finances at home and I suspect would not have significant difficulty managing at home however am recommending [place] evaluation.      SLP Assessment  All further Speech Lanaguage Pathology  needs can be addressed in the next venue of care    Follow Up Recommendations  Home health SLP    Frequency and Duration           SLP Evaluation Cognition  Overall Cognitive Status: Impaired/Different from baseline Arousal/Alertness: Awake/alert Orientation Level: Oriented X4 Attention: Sustained Sustained Attention: Appears intact Memory: Impaired Memory Impairment: Retrieval deficit (mild) Awareness: Appears intact Problem Solving: Impaired Problem Solving Impairment: Verbal basic Safety/Judgment: Appears intact       Comprehension  Auditory Comprehension Overall Auditory Comprehension: Appears within functional limits for tasks assessed Commands: Within Functional Limits Visual Recognition/Discrimination Discrimination: Not tested Reading Comprehension Reading Status: Not tested    Expression Expression Primary Mode  of Expression: Verbal Verbal Expression Overall Verbal Expression: Appears within functional limits for tasks assessed Initiation: No impairment Level of Generative/Spontaneous Verbalization: Conversation Repetition: Impaired Level of Impairment: Sentence level Naming: No impairment Pragmatics: No impairment Written Expression Dominant Hand: Right Written Expression: Not tested   Oral / Motor  Oral Motor/Sensory Function Overall Oral Motor/Sensory Function: Within functional limits Motor Speech Overall Motor Speech: Appears within functional limits for tasks assessed Respiration: Within functional limits Phonation: Normal Resonance: Within functional limits Articulation: Within functional limitis Intelligibility: Intelligible Motor Planning: Witnin functional limits   GO                    Houston Siren 10/06/2016, 4:18 PM   Orbie Pyo Kimberley Speece M.Ed [employer] [phone]

## 2016-10-06 NOTE — Progress Notes (Signed)
Encountered pt's husband outside rm and shared results of earlier mtg w/ pt this morning: that she did not wish to think about advanced directive now and had appreciated prayer for healing. He soon came to ask me to call his [place] [facility] and let them know she was here. Borrowed a hospital phone as they had no cell phone, found number, called, and husband talked to church administrative staff Carlyon Shadow, who said communion ministers were from [place] today and should be by soon. (Had been unsure if pt wished to see priest, but apparently not.) Gave pt prayer card w/ image of/prayer to [facility], which she appreciated. Communion ministers arrived shortly afterwards. Escorted them to rm, introduced them to pt and husband. Communion ministers prayed, administered communion to, and visited w/ pt and husband.Bonney Roussel available for f/u.   10/06/16 1100  Clinical Encounter Type  Visited With Family;Patient and family together  Visit Type Follow-up;Psychological support;Spiritual support;Social support;Critical Care  Referral From Chaplain  Spiritual Encounters  Spiritual Needs Prayer;Emotional  Stress Factors  Patient Stress Factors Health changes;Loss of control  Family Stress Factors Family relationships;Health changes;Loss of control   Gerrit Heck, Chaplain

## 2016-10-06 NOTE — Consult Note (Signed)
Reason for Consult: Nasal fracture Referring Physician: Trauma  Linda Copeland is an 83 y.o. female.  HPI: 83 year old female fell yesterday from standing and sustained facial and right arm injuries and subdural hematoma.  She did not lose consciousness or have amnesia to the event.  She was brought to the ER with nasal and forehead bleeding and was admitted for observation due to the subdural hematoma.  The right arm is in a sling due to a humerus fracture.  Bleeding has stopped.  She does not have much pain.  Past Medical History:  Diagnosis Date  . Hypertension   . Thyroid disease     History reviewed. No pertinent surgical history.  History reviewed. No pertinent family history.  Social History:  reports that she has never smoked. She has never used smokeless tobacco. She reports that she does not drink alcohol or use drugs.  Allergies:  Allergies  Allergen Reactions  . Codeine Nausea And Vomiting    Medications: I have reviewed the patient's current medications.  Results for orders placed or performed during the hospital encounter of 10/05/16 (from the past 48 hour(s))  Basic metabolic panel     Status: Abnormal   Collection Time: 10/05/16  7:10 PM  Result Value Ref Range   Sodium 136 135 - 145 mmol/L   Potassium 3.8 3.5 - 5.1 mmol/L   Chloride 104 101 - 111 mmol/L   CO2 22 22 - 32 mmol/L   Glucose, Bld 143 (H) 65 - 99 mg/dL   BUN 9 6 - 20 mg/dL   Creatinine, Ser 0.51 0.44 - 1.00 mg/dL   Calcium 9.3 8.9 - 10.3 mg/dL   GFR calc non Af Amer >60 >60 mL/min   GFR calc Af Amer >60 >60 mL/min    Comment: (NOTE) The eGFR has been calculated using the CKD EPI equation. This calculation has not been validated in all clinical situations. eGFR's persistently <60 mL/min signify possible Chronic Kidney Disease.    Anion gap 10 5 - 15  CBC with Differential     Status: Abnormal   Collection Time: 10/05/16  7:10 PM  Result Value Ref Range   WBC 14.7 (H) 4.0 - 10.5 K/uL   RBC  4.81 3.87 - 5.11 MIL/uL   Hemoglobin 14.4 12.0 - 15.0 g/dL   HCT 42.8 36.0 - 46.0 %   MCV 89.0 78.0 - 100.0 fL   MCH 29.9 26.0 - 34.0 pg   MCHC 33.6 30.0 - 36.0 g/dL   RDW 12.6 11.5 - 15.5 %   Platelets 237 150 - 400 K/uL   Neutrophils Relative % 86 %   Neutro Abs 12.7 (H) 1.7 - 7.7 K/uL   Lymphocytes Relative 10 %   Lymphs Abs 1.4 0.7 - 4.0 K/uL   Monocytes Relative 4 %   Monocytes Absolute 0.5 0.1 - 1.0 K/uL   Eosinophils Relative 0 %   Eosinophils Absolute 0.0 0.0 - 0.7 K/uL   Basophils Relative 0 %   Basophils Absolute 0.0 0.0 - 0.1 K/uL  Protime-INR     Status: None   Collection Time: 10/05/16  7:10 PM  Result Value Ref Range   Prothrombin Time 14.2 11.4 - 15.2 seconds   INR 2.77   Basic metabolic panel     Status: Abnormal   Collection Time: 10/06/16  3:28 AM  Result Value Ref Range   Sodium 135 135 - 145 mmol/L   Potassium 3.6 3.5 - 5.1 mmol/L   Chloride 102 101 -  111 mmol/L   CO2 23 22 - 32 mmol/L   Glucose, Bld 110 (H) 65 - 99 mg/dL   BUN 5 (L) 6 - 20 mg/dL   Creatinine, Ser 0.47 0.44 - 1.00 mg/dL   Calcium 9.0 8.9 - 10.3 mg/dL   GFR calc non Af Amer >60 >60 mL/min   GFR calc Af Amer >60 >60 mL/min    Comment: (NOTE) The eGFR has been calculated using the CKD EPI equation. This calculation has not been validated in all clinical situations. eGFR's persistently <60 mL/min signify possible Chronic Kidney Disease.    Anion gap 10 5 - 15  CBC     Status: Abnormal   Collection Time: 10/06/16  3:28 AM  Result Value Ref Range   WBC 12.8 (H) 4.0 - 10.5 K/uL   RBC 4.32 3.87 - 5.11 MIL/uL   Hemoglobin 12.8 12.0 - 15.0 g/dL   HCT 37.9 36.0 - 46.0 %   MCV 87.7 78.0 - 100.0 fL   MCH 29.6 26.0 - 34.0 pg   MCHC 33.8 30.0 - 36.0 g/dL   RDW 12.6 11.5 - 15.5 %   Platelets 250 150 - 400 K/uL    Dg Shoulder Right  Result Date: 10/05/2016 CLINICAL DATA:  Right shoulder pain and deformity after fall. Is fallen are EXAM: RIGHT SHOULDER - 2+ VIEW COMPARISON:  None. FINDINGS:  There is an acute, closed, transverse fracture through the surgical neck of the humerus. Equivocal greater tuberosity fracture suggested on the AP view of the shoulder not confirmed however on the scapular Y-views. No significant angulation or displacement. The scapula, AC joint, visualized right ribs and lung are unremarkable. No malalignment of the glenohumeral joint. IMPRESSION: Neer category 1 part fracture of the surgical neck of the right humerus without significant angulation nor displacement. Equivocal greater tuberosity fracture is also suggested on the AP view but not confirmed on the lateral projections. Electronically Signed   By: [name] M.D.   On: 10/05/2016 18:35   Ct Head Without Contrast  Result Date: 10/06/2016 CLINICAL DATA:  Followup subdural hematoma. EXAM: CT HEAD WITHOUT CONTRAST TECHNIQUE: Contiguous axial images were obtained from the base of the skull through the vertex without intravenous contrast. COMPARISON:  CT HEAD October 05, 2016 FINDINGS: BRAIN: The ventricles and sulci are normal for age, similar asymmetrically prominent RIGHT ventricle atrium. No intraparenchymal hemorrhage, mass effect nor midline shift. Patchy supratentorial white matter hypodensities within normal range for patient's age, though non-specific are most compatible with chronic small vessel ischemic disease. No acute large vascular territory infarcts. Similar dense 13 mm at LEFT parafalcine subdural hematoma extending to LEFT cerebellar tentorium. Small LEFT frontal convexity extra-axial blood products are similar. Basal cisterns are patent. VASCULAR: Moderate calcific atherosclerosis of the carotid siphons. SKULL: No skull fracture. Acute bilateral nasal bone fractures. Large bifrontal scalp hematoma without subcutaneous gas or radiopaque foreign bodies. SINUSES/ORBITS: The mastoid air-cells and included paranasal sinuses are well-aerated.Status post bilateral ocular lens implants. The included ocular globes  and orbital contents are non-suspicious. OTHER: Patient is edentulous. IMPRESSION: Stable 13 mm LEFT falcotentorial subdural hematoma. Small amount of LEFT frontal convexity extra-axial blood products. Large frontal scalp hematoma without skull fracture. Acute nasal bone fractures. Electronically Signed   By: Elon Alas M.D.   On: 10/06/2016 04:36   Ct Head Wo Contrast  Addendum Date: 10/05/2016   ADDENDUM REPORT: 10/05/2016 18:47 ADDENDUM: Critical Value/emergent results were called by telephone at the time of interpretation on 10/05/2016 at 1830 hours  to Dr. Theotis Burrow , who verbally acknowledged these results. Electronically Signed   By: [name] M.D.   On: 10/05/2016 18:47   Result Date: 10/05/2016 CLINICAL DATA:  83 year old female status post trip and fall on rough concrete outside this afternoon. Face and nose laceration. Ecchymoses. Initial encounter. EXAM: CT HEAD WITHOUT CONTRAST CT MAXILLOFACIAL WITHOUT CONTRAST CT CERVICAL SPINE WITHOUT CONTRAST TECHNIQUE: Multidetector CT imaging of the head, cervical spine, and maxillofacial structures were performed using the standard protocol without intravenous contrast. Multiplanar CT image reconstructions of the cervical spine and maxillofacial structures were also generated. COMPARISON:  Cervical spine radiographs 03/26/2005. FINDINGS: CT HEAD FINDINGS Brain: Subdural hematoma along the interhemispheric fissure measuring up to 15 mm in thickness. This is associated with globular left superior parasagittal hyperdensity measuring 10-12 mm is over the left superior frontal gyrus as seen on series 203, image 35. Mild regional mass effect. No definite cerebral edema. Subdural hematoma tracks along the left tentorium measuring up to 2-3 mm in thickness there. There is also trace left lateral convexity subdural hematoma most visible at the operculum and measuring 2-3 mm. No peripheral right side subdural. No other extra-axial hemorrhage identified. No midline  shift. No ventriculomegaly. No cortically based acute infarct identified. Vascular: Dominant distal left vertebral artery. Calcified atherosclerosis at the skull base. Skull: Hyperostosis of the calvarium (normal variant). No calvarium fracture identified. Facial fractures described below. Other: Anterior scalp and forehead superficial hematoma and subcutaneous gas in keeping with lacerations. The posterior scalp soft tissues appear normal. See facial findings below. CT MAXILLOFACIAL FINDINGS Osseous: Mandible intact.  No maxilla or zygoma fracture. Bilateral nasal bone fractures, mostly nondisplaced on the left and comminuted and mildly displaced on the right (series 302, image 37). There is also leftward deviation and angulation of the anterior bony nasal septum which could be acute. No central skullbase fracture. Orbits: No orbital wall fracture. Globes are intact. Post cataract surgery changes. No intraorbital hematoma or contusion. Sinuses: Mild bubbly opacity in the right sphenoid sinus. The other paranasal sinuses are clear. There is a small volume of blood or retained secretions in the nasal cavity. Tympanic cavities and mastoids are clear. Soft tissues: Soft tissue swelling overlying the knee nose. Broad-based forehead hematoma measuring up to 6 mm in thickness. Superimposed subcutaneous gas in keeping with associated lacerations. The lateral and more inferior face superficial soft tissues appear normal. Small volume retained secretions in the oropharynx. The adenoid soft tissue may be surgically absent. There is bulky lingual tonsillar hypertrophy. There does appear to be bilateral palatine tonsillar tissue present, with postinflammatory calcifications on the left. Negative visualized parapharyngeal, retropharyngeal and sublingual spaces. The submandibular glands appear to be atrophied. Negative parotid glands. Coarsely calcified right level 2/level 3 lymph node. No superior neck lymphadenopathy. CT  CERVICAL SPINE FINDINGS Alignment: Exaggerated upper cervical lordosis and straightening of lower cervical lordosis. Trace anterolisthesis of C6 on C7, and similar anterolisthesis of C7 on T1. These appear degenerative. There is ankylosis of the C5-C6 level which may be congenitally incompletely segmented. Bilateral posterior element alignment is within normal limits. Skull base and vertebrae: Degenerative changes at the anterior C1 odontoid and also the left lateral C1-C2 and occipital condyles articulations. Bulky ligamentous hypertrophy about the odontoid. Visualized skull base is intact. No atlanto-occipital dissociation. No acute cervical spine fracture identified. Soft tissues and spinal canal: No prevertebral fluid or swelling. No visible canal hematoma. Postinflammatory appearing densely calcified right neck lymph node. There is bilateral level 2 and/or level 3  lymph node enlargement up to 10-11 mm mm short axis. The bulky lingual and to a lesser extent palatine tonsillar enlargement is again noted. No lower cervical lymphadenopathy. Negative retropharyngeal space. Incidental retropharyngeal course of both common carotid arteries. Negative larynx. Diminutive thyroid. Bilateral carotid artery bifurcation. Disc levels: Possible mild degenerative cervical spinal stenosis at C4-C5. Widespread facet arthropathy. Upper chest: Mild respiratory motion artifact but negative visualized lung apices. Calcified aortic atherosclerosis. Visualized upper thoracic levels appear grossly intact. IMPRESSION: 1. Positive for acute intracranial hemorrhage. Left side predominantly parafalcine subdural hematoma measuring up to 15 in thickness along the interhemispheric fissure. Left tentorial and lateral subdural component measuring 2-3 mm in thickness. No significant intracranial mass effect or midline shift at this time. 2. No other acute traumatic injury to the brain identified. 3. Right greater than left nasal bone fractures.  Possible associated anterior nasal septal fracture. Overlying soft tissue swelling. 4. Anterior forehead and scalp soft tissue injury without underlying fracture. 5. No acute fracture or listhesis identified in the cervical spine. Advanced skullbase and cervical spine degenerative changes. 6. Nonspecific mild to moderate bilateral level 2/3 cervical lymphadenopathy in association with bulky enlargement of the lingual tonsil. Mild asymmetry of the palatine tonsils. Recommend outpatient follow-up neck CT with IV contrast to evaluate further. 7.  Calcified aortic atherosclerosis. Electronically Signed: By: [name] M.D. On: 10/05/2016 18:28   Ct Cervical Spine Wo Contrast  Addendum Date: 10/05/2016   ADDENDUM REPORT: 10/05/2016 18:47 ADDENDUM: Critical Value/emergent results were called by telephone at the time of interpretation on 10/05/2016 at 1830 hours to Dr. Theotis Burrow , who verbally acknowledged these results. Electronically Signed   By: [name] M.D.   On: 10/05/2016 18:47   Result Date: 10/05/2016 CLINICAL DATA:  83 year old female status post trip and fall on rough concrete outside this afternoon. Face and nose laceration. Ecchymoses. Initial encounter. EXAM: CT HEAD WITHOUT CONTRAST CT MAXILLOFACIAL WITHOUT CONTRAST CT CERVICAL SPINE WITHOUT CONTRAST TECHNIQUE: Multidetector CT imaging of the head, cervical spine, and maxillofacial structures were performed using the standard protocol without intravenous contrast. Multiplanar CT image reconstructions of the cervical spine and maxillofacial structures were also generated. COMPARISON:  Cervical spine radiographs 03/26/2005. FINDINGS: CT HEAD FINDINGS Brain: Subdural hematoma along the interhemispheric fissure measuring up to 15 mm in thickness. This is associated with globular left superior parasagittal hyperdensity measuring 10-12 mm is over the left superior frontal gyrus as seen on series 203, image 35. Mild regional mass effect. No definite cerebral  edema. Subdural hematoma tracks along the left tentorium measuring up to 2-3 mm in thickness there. There is also trace left lateral convexity subdural hematoma most visible at the operculum and measuring 2-3 mm. No peripheral right side subdural. No other extra-axial hemorrhage identified. No midline shift. No ventriculomegaly. No cortically based acute infarct identified. Vascular: Dominant distal left vertebral artery. Calcified atherosclerosis at the skull base. Skull: Hyperostosis of the calvarium (normal variant). No calvarium fracture identified. Facial fractures described below. Other: Anterior scalp and forehead superficial hematoma and subcutaneous gas in keeping with lacerations. The posterior scalp soft tissues appear normal. See facial findings below. CT MAXILLOFACIAL FINDINGS Osseous: Mandible intact.  No maxilla or zygoma fracture. Bilateral nasal bone fractures, mostly nondisplaced on the left and comminuted and mildly displaced on the right (series 302, image 37). There is also leftward deviation and angulation of the anterior bony nasal septum which could be acute. No central skullbase fracture. Orbits: No orbital wall fracture. Globes are intact. Post cataract  surgery changes. No intraorbital hematoma or contusion. Sinuses: Mild bubbly opacity in the right sphenoid sinus. The other paranasal sinuses are clear. There is a small volume of blood or retained secretions in the nasal cavity. Tympanic cavities and mastoids are clear. Soft tissues: Soft tissue swelling overlying the knee nose. Broad-based forehead hematoma measuring up to 6 mm in thickness. Superimposed subcutaneous gas in keeping with associated lacerations. The lateral and more inferior face superficial soft tissues appear normal. Small volume retained secretions in the oropharynx. The adenoid soft tissue may be surgically absent. There is bulky lingual tonsillar hypertrophy. There does appear to be bilateral palatine tonsillar tissue  present, with postinflammatory calcifications on the left. Negative visualized parapharyngeal, retropharyngeal and sublingual spaces. The submandibular glands appear to be atrophied. Negative parotid glands. Coarsely calcified right level 2/level 3 lymph node. No superior neck lymphadenopathy. CT CERVICAL SPINE FINDINGS Alignment: Exaggerated upper cervical lordosis and straightening of lower cervical lordosis. Trace anterolisthesis of C6 on C7, and similar anterolisthesis of C7 on T1. These appear degenerative. There is ankylosis of the C5-C6 level which may be congenitally incompletely segmented. Bilateral posterior element alignment is within normal limits. Skull base and vertebrae: Degenerative changes at the anterior C1 odontoid and also the left lateral C1-C2 and occipital condyles articulations. Bulky ligamentous hypertrophy about the odontoid. Visualized skull base is intact. No atlanto-occipital dissociation. No acute cervical spine fracture identified. Soft tissues and spinal canal: No prevertebral fluid or swelling. No visible canal hematoma. Postinflammatory appearing densely calcified right neck lymph node. There is bilateral level 2 and/or level 3 lymph node enlargement up to 10-11 mm mm short axis. The bulky lingual and to a lesser extent palatine tonsillar enlargement is again noted. No lower cervical lymphadenopathy. Negative retropharyngeal space. Incidental retropharyngeal course of both common carotid arteries. Negative larynx. Diminutive thyroid. Bilateral carotid artery bifurcation. Disc levels: Possible mild degenerative cervical spinal stenosis at C4-C5. Widespread facet arthropathy. Upper chest: Mild respiratory motion artifact but negative visualized lung apices. Calcified aortic atherosclerosis. Visualized upper thoracic levels appear grossly intact. IMPRESSION: 1. Positive for acute intracranial hemorrhage. Left side predominantly parafalcine subdural hematoma measuring up to 15 in  thickness along the interhemispheric fissure. Left tentorial and lateral subdural component measuring 2-3 mm in thickness. No significant intracranial mass effect or midline shift at this time. 2. No other acute traumatic injury to the brain identified. 3. Right greater than left nasal bone fractures. Possible associated anterior nasal septal fracture. Overlying soft tissue swelling. 4. Anterior forehead and scalp soft tissue injury without underlying fracture. 5. No acute fracture or listhesis identified in the cervical spine. Advanced skullbase and cervical spine degenerative changes. 6. Nonspecific mild to moderate bilateral level 2/3 cervical lymphadenopathy in association with bulky enlargement of the lingual tonsil. Mild asymmetry of the palatine tonsils. Recommend outpatient follow-up neck CT with IV contrast to evaluate further. 7.  Calcified aortic atherosclerosis. Electronically Signed: By: [name] M.D. On: 10/05/2016 18:28   Ct Maxillofacial Wo Cm  Addendum Date: 10/05/2016   ADDENDUM REPORT: 10/05/2016 18:47 ADDENDUM: Critical Value/emergent results were called by telephone at the time of interpretation on 10/05/2016 at 1830 hours to Dr. Theotis Burrow , who verbally acknowledged these results. Electronically Signed   By: [name] M.D.   On: 10/05/2016 18:47   Result Date: 10/05/2016 CLINICAL DATA:  83 year old female status post trip and fall on rough concrete outside this afternoon. Face and nose laceration. Ecchymoses. Initial encounter. EXAM: CT HEAD WITHOUT CONTRAST CT MAXILLOFACIAL WITHOUT CONTRAST  CT CERVICAL SPINE WITHOUT CONTRAST TECHNIQUE: Multidetector CT imaging of the head, cervical spine, and maxillofacial structures were performed using the standard protocol without intravenous contrast. Multiplanar CT image reconstructions of the cervical spine and maxillofacial structures were also generated. COMPARISON:  Cervical spine radiographs 03/26/2005. FINDINGS: CT HEAD FINDINGS Brain: Subdural  hematoma along the interhemispheric fissure measuring up to 15 mm in thickness. This is associated with globular left superior parasagittal hyperdensity measuring 10-12 mm is over the left superior frontal gyrus as seen on series 203, image 35. Mild regional mass effect. No definite cerebral edema. Subdural hematoma tracks along the left tentorium measuring up to 2-3 mm in thickness there. There is also trace left lateral convexity subdural hematoma most visible at the operculum and measuring 2-3 mm. No peripheral right side subdural. No other extra-axial hemorrhage identified. No midline shift. No ventriculomegaly. No cortically based acute infarct identified. Vascular: Dominant distal left vertebral artery. Calcified atherosclerosis at the skull base. Skull: Hyperostosis of the calvarium (normal variant). No calvarium fracture identified. Facial fractures described below. Other: Anterior scalp and forehead superficial hematoma and subcutaneous gas in keeping with lacerations. The posterior scalp soft tissues appear normal. See facial findings below. CT MAXILLOFACIAL FINDINGS Osseous: Mandible intact.  No maxilla or zygoma fracture. Bilateral nasal bone fractures, mostly nondisplaced on the left and comminuted and mildly displaced on the right (series 302, image 37). There is also leftward deviation and angulation of the anterior bony nasal septum which could be acute. No central skullbase fracture. Orbits: No orbital wall fracture. Globes are intact. Post cataract surgery changes. No intraorbital hematoma or contusion. Sinuses: Mild bubbly opacity in the right sphenoid sinus. The other paranasal sinuses are clear. There is a small volume of blood or retained secretions in the nasal cavity. Tympanic cavities and mastoids are clear. Soft tissues: Soft tissue swelling overlying the knee nose. Broad-based forehead hematoma measuring up to 6 mm in thickness. Superimposed subcutaneous gas in keeping with associated  lacerations. The lateral and more inferior face superficial soft tissues appear normal. Small volume retained secretions in the oropharynx. The adenoid soft tissue may be surgically absent. There is bulky lingual tonsillar hypertrophy. There does appear to be bilateral palatine tonsillar tissue present, with postinflammatory calcifications on the left. Negative visualized parapharyngeal, retropharyngeal and sublingual spaces. The submandibular glands appear to be atrophied. Negative parotid glands. Coarsely calcified right level 2/level 3 lymph node. No superior neck lymphadenopathy. CT CERVICAL SPINE FINDINGS Alignment: Exaggerated upper cervical lordosis and straightening of lower cervical lordosis. Trace anterolisthesis of C6 on C7, and similar anterolisthesis of C7 on T1. These appear degenerative. There is ankylosis of the C5-C6 level which may be congenitally incompletely segmented. Bilateral posterior element alignment is within normal limits. Skull base and vertebrae: Degenerative changes at the anterior C1 odontoid and also the left lateral C1-C2 and occipital condyles articulations. Bulky ligamentous hypertrophy about the odontoid. Visualized skull base is intact. No atlanto-occipital dissociation. No acute cervical spine fracture identified. Soft tissues and spinal canal: No prevertebral fluid or swelling. No visible canal hematoma. Postinflammatory appearing densely calcified right neck lymph node. There is bilateral level 2 and/or level 3 lymph node enlargement up to 10-11 mm mm short axis. The bulky lingual and to a lesser extent palatine tonsillar enlargement is again noted. No lower cervical lymphadenopathy. Negative retropharyngeal space. Incidental retropharyngeal course of both common carotid arteries. Negative larynx. Diminutive thyroid. Bilateral carotid artery bifurcation. Disc levels: Possible mild degenerative cervical spinal stenosis at C4-C5. Widespread facet arthropathy. Upper  chest: Mild  respiratory motion artifact but negative visualized lung apices. Calcified aortic atherosclerosis. Visualized upper thoracic levels appear grossly intact. IMPRESSION: 1. Positive for acute intracranial hemorrhage. Left side predominantly parafalcine subdural hematoma measuring up to 15 in thickness along the interhemispheric fissure. Left tentorial and lateral subdural component measuring 2-3 mm in thickness. No significant intracranial mass effect or midline shift at this time. 2. No other acute traumatic injury to the brain identified. 3. Right greater than left nasal bone fractures. Possible associated anterior nasal septal fracture. Overlying soft tissue swelling. 4. Anterior forehead and scalp soft tissue injury without underlying fracture. 5. No acute fracture or listhesis identified in the cervical spine. Advanced skullbase and cervical spine degenerative changes. 6. Nonspecific mild to moderate bilateral level 2/3 cervical lymphadenopathy in association with bulky enlargement of the lingual tonsil. Mild asymmetry of the palatine tonsils. Recommend outpatient follow-up neck CT with IV contrast to evaluate further. 7.  Calcified aortic atherosclerosis. Electronically Signed: By: [name] M.D. On: 10/05/2016 18:28    Review of Systems  Musculoskeletal: Positive for joint pain.  Neurological: Positive for headaches.   Blood pressure (!) 138/93, pulse 86, temperature 98.2 F (36.8 C), temperature source Oral, resp. rate (!) 24, height 5' (1.524 m), weight 163 lb 12.8 oz (74.3 kg), SpO2 95 %. Physical Exam  Constitutional: She is oriented to person, place, and time. She appears well-developed and well-nourished. No distress.  HENT:  Right Ear: External ear normal.  Left Ear: External ear normal.  Mouth/Throat: Oropharynx is clear and moist.  Forehead, nasal, and bilateral periorbital ecchymosis and edema.  Two vertical superficial lacerations on central forehead.  External nose edematous, difficult  to feel nasal bones.  Upper and lower dentures.  Midface and mandible stable.  Eyes: Conjunctivae and EOM are normal. Pupils are equal, round, and reactive to light.  Neck: Normal range of motion. Neck supple.  Cardiovascular: Normal rate.   Respiratory: Effort normal.  Musculoskeletal: Normal range of motion.  Right arm in sling.  Neurological: She is alert and oriented to person, place, and time. No cranial nerve deficit.  Skin: Skin is warm and dry.  Psychiatric: She has a normal mood and affect. Her behavior is normal. Judgment and thought content normal.    Assessment/Plan: Nasal fracture I personally reviewed her maxillofacial CT demonstrating nasal bone fractures.  May require closed reduction.  Have to wait for edema to resolve.  Follow-up in my office mid-week next week.  Jamorian Dimaria 10/06/2016, 8:21 AM

## 2016-10-06 NOTE — Progress Notes (Signed)
New Admission Note:  Arrival Method: wheelchair Mental Orientation: A & O x 4  Telemetry: box 19 Assessment: Completed Skin: FX nose, face lacerations from fall. Bruising aroud nose, middle of forehead, top of the her head. See flowsheet.  IV: R AC/ SL Pain: 0/10 Tubes: n/a Safety Measures: Safety Fall Prevention Plan was given, discussed. Admission: Completed 59M: Patient has been orientated to the room, unit and the staff. Family: visiting   Orders have been reviewed and implemented. Will continue to monitor the patient. Call light has been placed within reach and bed alarm has been activated.   Arta Silence ,RN

## 2016-10-06 NOTE — Progress Notes (Signed)
Patient ID: Linda Copeland, female   DOB: [birth year], 83 y.o.   MRN: [id number] Patient well neurologically intact minimal headache  CT improved  Mobilized per trauma

## 2016-10-06 NOTE — Progress Notes (Signed)
Report given to 89M nurse at this time.  Pt has no c/o pain or s/s of any acute distress.

## 2016-10-06 NOTE — Progress Notes (Signed)
Responded to consult to create advanced directive. However, pt said she knows they want her to do it, but she doesn't want to think about it now. She had a copy of form on table in front of her where she was sitting up in chair. Assured her she did not then have to think about it now --  it was no one's decision but hers, and she could ask the nurse to page again if she wished to talk about it another time. She seemed relieved, thanked me, and appreciated prayer for healing. Offered spiritual/emotional support. Chaplain available for f/u.   10/06/16 1000  Clinical Encounter Type  Visited With Patient  Visit Type Initial;Psychological support;Spiritual support;Social support  Referral From Nurse  Spiritual Encounters  Spiritual Needs Brochure;Prayer;Emotional  Stress Factors  Patient Stress Factors Exhausted;Health changes;Loss of control   Gerrit Heck, Chaplain

## 2016-10-06 NOTE — Progress Notes (Signed)
Per his request, escorted pt's husband to cafeteria and facilitated his getting lunch. He said his legs were weak due to diabetes and could not easily walk long distances -- had been taken to pt's rm that morning in wheelchair. Advised that if he needed wheelchair help upon his departure from cafeteria, please ask a cafeteria employee to summon someone who could assist with that.   10/06/16 1400  Clinical Encounter Type  Visited With Family  Visit Type Follow-up;Social support  Referral From [place] Arley Salamone, Chaplain

## 2016-10-06 NOTE — Care Management Note (Signed)
Case Management Note  Patient Details  Name: Armande Leyvas MRN: [id number] Date of Birth: 29-Aug-1931  Subjective/Objective:  Pt admitted on 10/05/16 s/p fall sustaining nasal fx, Rt humeral neck fx, and SDH.  PTA, pt resided at home with spouse.                    Action/Plan: PT recommending HH follow up; awaiting OT consult.  MD: please leave orders for [facility] follow as recommended by therapies.  Thanks.    Expected Discharge Date:                  Expected Discharge Plan:  [place]  In-House Referral:     Discharge planning Services  CM Consult  Post Acute Care Choice:    Choice offered to:     DME Arranged:    DME Agency:     HH Arranged:    [place] Agency:     Status of Service:  In process, will continue to follow  If discussed at Long Length of Stay Meetings, dates discussed:    Additional Comments:  Ella Bodo, RN 10/06/2016, 4:10 PM

## 2016-10-07 DIAGNOSIS — S065X0A Traumatic subdural hemorrhage without loss of consciousness, initial encounter: Secondary | ICD-10-CM | POA: Diagnosis not present

## 2016-10-07 LAB — BASIC METABOLIC PANEL
Anion gap: 10 (ref 5–15)
BUN: 7 mg/dL (ref 6–20)
CHLORIDE: 103 mmol/L (ref 101–111)
CO2: 22 mmol/L (ref 22–32)
CREATININE: 0.47 mg/dL (ref 0.44–1.00)
Calcium: 9.2 mg/dL (ref 8.9–10.3)
GFR calc Af Amer: >60 mL/min (ref >60)
GFR calc non Af Amer: >60 mL/min (ref >60)
GLUCOSE: 148 mg/dL — AB (ref 65–99)
POTASSIUM: 3.5 mmol/L (ref 3.5–5.1)
SODIUM: 135 mmol/L (ref 135–145)

## 2016-10-07 MED ORDER — ONDANSETRON HCL 4 MG PO TABS: 4.0000 mg | ORAL_TABLET | Freq: Four times a day (QID) | ORAL | 0 refills | Status: DC | PRN

## 2016-10-07 MED ORDER — ACETAMINOPHEN 325 MG PO TABS: 650.0000 mg | ORAL_TABLET | Freq: Four times a day (QID) | ORAL | Status: DC | PRN

## 2016-10-07 MED ORDER — HYDROCODONE-ACETAMINOPHEN 5-325 MG PO TABS: 1.0000 | ORAL_TABLET | ORAL | 0 refills | Status: DC | PRN

## 2016-10-07 NOTE — Progress Notes (Signed)
Physical Therapy Treatment Patient Details Name: Linda Copeland MRN: [id number] DOB: 06-04-1931 Today's Date: 10/07/2016    History of Present Illness Patient is a 83 y/o female with hx of HTN and thyroid disease presents s/p fall sustaining nasal fx and Right humeral neck fx. Head CT-Stable 13 mm LEFT falcotentorial subdural hematoma.     PT Comments    Husband present for session. Educated on level of assist needed for pt care. He verbalizes understanding.   Follow Up Recommendations  Home health PT;Supervision for mobility/OOB;Supervision/Assistance - 24 hour     Equipment Recommendations  None recommended by PT    Recommendations for Other Services       Precautions / Restrictions Precautions Precautions: Fall Precaution Comments: R UE allowed pendulums and elbow flexion per Dr Marcelino Scot Required Braces or Orthoses: Sling Restrictions RUE Weight Bearing: Non weight bearing    Mobility  Bed Mobility               General bed mobility comments: Pt received in recliner.  Transfers   Equipment used: None   Sit to Stand: Min guard         General transfer comment: verbal cues for hand placement  Ambulation/Gait Ambulation/Gait assistance: Min assist Ambulation Distance (Feet): 150 Feet Assistive device: 1 person hand held assist Gait Pattern/deviations: Step-through pattern;Staggering left;Staggering right;Decreased stride length Gait velocity: decreased Gait velocity interpretation: Below normal speed for age/gender General Gait Details: SPC not attempted. Pt will not remember to use it or it may be a fall/trip risk due to cognitive deficits.   Stairs Stairs: Yes   Stair Management: One rail Left;Step to pattern Number of Stairs: 2    Wheelchair Mobility    Modified Rankin (Stroke Patients Only) Modified Rankin (Stroke Patients Only) Pre-Morbid Rankin Score: Slight disability Modified Rankin: Moderately severe disability     Balance    Sitting-balance support: Feet supported;No upper extremity supported Sitting balance-Leahy Scale: Fair     Standing balance support: During functional activity Standing balance-Leahy Scale: Fair                      Cognition Arousal/Alertness: Awake/alert Behavior During Therapy: Anxious Overall Cognitive Status: Impaired/Different from baseline Area of Impairment: Safety/judgement;Awareness;Problem solving         Safety/Judgement: Decreased awareness of safety;Decreased awareness of deficits Awareness: Anticipatory Problem Solving: Difficulty sequencing;Slow processing;Requires verbal cues      Exercises      General Comments        Pertinent Vitals/Pain Pain Assessment: No/denies pain    Home Living                      Prior Function            PT Goals (current goals can now be found in the care plan section) Acute Rehab PT Goals Patient Stated Goal: to get home PT Goal Formulation: With patient Time For Goal Achievement: 10/20/16 Potential to Achieve Goals: Good Progress towards PT goals: Progressing toward goals    Frequency    Min 3X/week      PT Plan Current plan remains appropriate    Co-evaluation             End of Session Equipment Utilized During Treatment: Gait belt;Other (comment) (LUE sling) Activity Tolerance: Patient tolerated treatment well Patient left: in chair;with call bell/phone within reach;with family/visitor present     Time: [id number] PT Time Calculation (min) (ACUTE ONLY): 27 min  Charges:  $  Gait Training: 23-37 mins                    G Codes:  Functional Assessment Tool Used: clinical judgment Functional Limitation: Mobility: Walking and moving around Mobility: Walking and Moving Around Current Status [phone]): At least 20 percent but less than 40 percent impaired, limited or restricted Mobility: Walking and Moving Around Goal Status [phone]): At least 1 percent but less than 20 percent  impaired, limited or restricted   Lorriane Shire 10/07/2016, 12:49 PM

## 2016-10-07 NOTE — Progress Notes (Signed)
[facility] Progress Note     Subjective: No complaints. Pain controlled on tylenol. Tolerating PO  Objective: Vital signs in last 24 hours: Temp:  [98.3 F (36.8 C)-98.6 F (37 C)] 98.3 F (36.8 C) (01/06 0907) Pulse Rate:  [75-90] 90 (01/06 0907) Resp:  [15-28] 18 (01/06 0907) BP: (136-157)/(53-88) 136/75 (01/06 0907) SpO2:  [93 %-97 %] 96 % (01/06 0907) Last BM Date: 10/04/16  Intake/Output from previous day: 01/05 0701 - 01/06 0700 In: 250 [P.O.:240; I.V.:10] Out: 1025 [Urine:1025] Intake/Output this shift: No intake/output data recorded.  PE: Gen:  Alert, NAD, pleasant and cooperative HEENT: nasal, forehead, and bilateral periorbital ecchymosis  Card:  RRR Pulm:  Clear to auscultation bilaterally Abd: Soft, NT/ND, +BS Ext:  R shoulder in sling  Lab Results:   Recent Labs  10/05/16 1910 10/06/16 0328  WBC 14.7* 12.8*  HGB 14.4 12.8  HCT 42.8 37.9  PLT 237 250   BMET  Recent Labs  10/05/16 1910 10/06/16 0328  NA 136 135  K 3.8 3.6  CL 104 102  CO2 22 23  GLUCOSE 143* 110*  BUN 9 5*  CREATININE 0.51 0.47  CALCIUM 9.3 9.0   PT/INR  Recent Labs  10/05/16 1910  LABPROT 14.2  INR 1.10   CMP     Component Value Date/Time   NA 135 10/06/2016 0328   K 3.6 10/06/2016 0328   CL 102 10/06/2016 0328   CO2 23 10/06/2016 0328   GLUCOSE 110 (H) 10/06/2016 0328   BUN 5 (L) 10/06/2016 0328   CREATININE 0.47 10/06/2016 0328   CALCIUM 9.0 10/06/2016 0328   GFRNONAA >60 10/06/2016 0328   GFRAA >60 10/06/2016 0328   Lipase  No results found for: LIPASE     Studies/Results: Dg Shoulder Right  Result Date: 10/05/2016 CLINICAL DATA:  Right shoulder pain and deformity after fall. Is fallen are EXAM: RIGHT SHOULDER - 2+ VIEW COMPARISON:  None. FINDINGS: There is an acute, closed, transverse fracture through the surgical neck of the humerus. Equivocal greater tuberosity fracture suggested on the AP view of the shoulder not confirmed  however on the scapular Y-views. No significant angulation or displacement. The scapula, AC joint, visualized right ribs and lung are unremarkable. No malalignment of the glenohumeral joint. IMPRESSION: Neer category 1 part fracture of the surgical neck of the right humerus without significant angulation nor displacement. Equivocal greater tuberosity fracture is also suggested on the AP view but not confirmed on the lateral projections. Electronically Signed   By: [name] M.D.   On: 10/05/2016 18:35   Ct Head Without Contrast  Result Date: 10/06/2016 CLINICAL DATA:  Followup subdural hematoma. EXAM: CT HEAD WITHOUT CONTRAST TECHNIQUE: Contiguous axial images were obtained from the base of the skull through the vertex without intravenous contrast. COMPARISON:  CT HEAD October 05, 2016 FINDINGS: BRAIN: The ventricles and sulci are normal for age, similar asymmetrically prominent RIGHT ventricle atrium. No intraparenchymal hemorrhage, mass effect nor midline shift. Patchy supratentorial white matter hypodensities within normal range for patient's age, though non-specific are most compatible with chronic small vessel ischemic disease. No acute large vascular territory infarcts. Similar dense 13 mm at LEFT parafalcine subdural hematoma extending to LEFT cerebellar tentorium. Small LEFT frontal convexity extra-axial blood products are similar. Basal cisterns are patent. VASCULAR: Moderate calcific atherosclerosis of the carotid siphons. SKULL: No skull fracture. Acute bilateral nasal bone fractures. Large bifrontal scalp hematoma without subcutaneous gas or radiopaque foreign bodies. SINUSES/ORBITS: The mastoid air-cells and included  paranasal sinuses are well-aerated.Status post bilateral ocular lens implants. The included ocular globes and orbital contents are non-suspicious. OTHER: Patient is edentulous. IMPRESSION: Stable 13 mm LEFT falcotentorial subdural hematoma. Small amount of LEFT frontal convexity  extra-axial blood products. Large frontal scalp hematoma without skull fracture. Acute nasal bone fractures. Electronically Signed   By: Elon Alas M.D.   On: 10/06/2016 04:36   Ct Head Wo Contrast  Addendum Date: 10/05/2016   ADDENDUM REPORT: 10/05/2016 18:47 ADDENDUM: Critical Value/emergent results were called by telephone at the time of interpretation on 10/05/2016 at 1830 hours to Dr. Theotis Burrow , who verbally acknowledged these results. Electronically Signed   By: [name] M.D.   On: 10/05/2016 18:47   Result Date: 10/05/2016 CLINICAL DATA:  83 year old female status post trip and fall on rough concrete outside this afternoon. Face and nose laceration. Ecchymoses. Initial encounter. EXAM: CT HEAD WITHOUT CONTRAST CT MAXILLOFACIAL WITHOUT CONTRAST CT CERVICAL SPINE WITHOUT CONTRAST TECHNIQUE: Multidetector CT imaging of the head, cervical spine, and maxillofacial structures were performed using the standard protocol without intravenous contrast. Multiplanar CT image reconstructions of the cervical spine and maxillofacial structures were also generated. COMPARISON:  Cervical spine radiographs 03/26/2005. FINDINGS: CT HEAD FINDINGS Brain: Subdural hematoma along the interhemispheric fissure measuring up to 15 mm in thickness. This is associated with globular left superior parasagittal hyperdensity measuring 10-12 mm is over the left superior frontal gyrus as seen on series 203, image 35. Mild regional mass effect. No definite cerebral edema. Subdural hematoma tracks along the left tentorium measuring up to 2-3 mm in thickness there. There is also trace left lateral convexity subdural hematoma most visible at the operculum and measuring 2-3 mm. No peripheral right side subdural. No other extra-axial hemorrhage identified. No midline shift. No ventriculomegaly. No cortically based acute infarct identified. Vascular: Dominant distal left vertebral artery. Calcified atherosclerosis at the skull base.  Skull: Hyperostosis of the calvarium (normal variant). No calvarium fracture identified. Facial fractures described below. Other: Anterior scalp and forehead superficial hematoma and subcutaneous gas in keeping with lacerations. The posterior scalp soft tissues appear normal. See facial findings below. CT MAXILLOFACIAL FINDINGS Osseous: Mandible intact.  No maxilla or zygoma fracture. Bilateral nasal bone fractures, mostly nondisplaced on the left and comminuted and mildly displaced on the right (series 302, image 37). There is also leftward deviation and angulation of the anterior bony nasal septum which could be acute. No central skullbase fracture. Orbits: No orbital wall fracture. Globes are intact. Post cataract surgery changes. No intraorbital hematoma or contusion. Sinuses: Mild bubbly opacity in the right sphenoid sinus. The other paranasal sinuses are clear. There is a small volume of blood or retained secretions in the nasal cavity. Tympanic cavities and mastoids are clear. Soft tissues: Soft tissue swelling overlying the knee nose. Broad-based forehead hematoma measuring up to 6 mm in thickness. Superimposed subcutaneous gas in keeping with associated lacerations. The lateral and more inferior face superficial soft tissues appear normal. Small volume retained secretions in the oropharynx. The adenoid soft tissue may be surgically absent. There is bulky lingual tonsillar hypertrophy. There does appear to be bilateral palatine tonsillar tissue present, with postinflammatory calcifications on the left. Negative visualized parapharyngeal, retropharyngeal and sublingual spaces. The submandibular glands appear to be atrophied. Negative parotid glands. Coarsely calcified right level 2/level 3 lymph node. No superior neck lymphadenopathy. CT CERVICAL SPINE FINDINGS Alignment: Exaggerated upper cervical lordosis and straightening of lower cervical lordosis. Trace anterolisthesis of C6 on C7, and similar  anterolisthesis  of C7 on T1. These appear degenerative. There is ankylosis of the C5-C6 level which may be congenitally incompletely segmented. Bilateral posterior element alignment is within normal limits. Skull base and vertebrae: Degenerative changes at the anterior C1 odontoid and also the left lateral C1-C2 and occipital condyles articulations. Bulky ligamentous hypertrophy about the odontoid. Visualized skull base is intact. No atlanto-occipital dissociation. No acute cervical spine fracture identified. Soft tissues and spinal canal: No prevertebral fluid or swelling. No visible canal hematoma. Postinflammatory appearing densely calcified right neck lymph node. There is bilateral level 2 and/or level 3 lymph node enlargement up to 10-11 mm mm short axis. The bulky lingual and to a lesser extent palatine tonsillar enlargement is again noted. No lower cervical lymphadenopathy. Negative retropharyngeal space. Incidental retropharyngeal course of both common carotid arteries. Negative larynx. Diminutive thyroid. Bilateral carotid artery bifurcation. Disc levels: Possible mild degenerative cervical spinal stenosis at C4-C5. Widespread facet arthropathy. Upper chest: Mild respiratory motion artifact but negative visualized lung apices. Calcified aortic atherosclerosis. Visualized upper thoracic levels appear grossly intact. IMPRESSION: 1. Positive for acute intracranial hemorrhage. Left side predominantly parafalcine subdural hematoma measuring up to 15 in thickness along the interhemispheric fissure. Left tentorial and lateral subdural component measuring 2-3 mm in thickness. No significant intracranial mass effect or midline shift at this time. 2. No other acute traumatic injury to the brain identified. 3. Right greater than left nasal bone fractures. Possible associated anterior nasal septal fracture. Overlying soft tissue swelling. 4. Anterior forehead and scalp soft tissue injury without underlying fracture. 5.  No acute fracture or listhesis identified in the cervical spine. Advanced skullbase and cervical spine degenerative changes. 6. Nonspecific mild to moderate bilateral level 2/3 cervical lymphadenopathy in association with bulky enlargement of the lingual tonsil. Mild asymmetry of the palatine tonsils. Recommend outpatient follow-up neck CT with IV contrast to evaluate further. 7.  Calcified aortic atherosclerosis. Electronically Signed: By: [name] M.D. On: 10/05/2016 18:28   Ct Cervical Spine Wo Contrast  Addendum Date: 10/05/2016   ADDENDUM REPORT: 10/05/2016 18:47 ADDENDUM: Critical Value/emergent results were called by telephone at the time of interpretation on 10/05/2016 at 1830 hours to Dr. Theotis Burrow , who verbally acknowledged these results. Electronically Signed   By: [name] M.D.   On: 10/05/2016 18:47   Result Date: 10/05/2016 CLINICAL DATA:  83 year old female status post trip and fall on rough concrete outside this afternoon. Face and nose laceration. Ecchymoses. Initial encounter. EXAM: CT HEAD WITHOUT CONTRAST CT MAXILLOFACIAL WITHOUT CONTRAST CT CERVICAL SPINE WITHOUT CONTRAST TECHNIQUE: Multidetector CT imaging of the head, cervical spine, and maxillofacial structures were performed using the standard protocol without intravenous contrast. Multiplanar CT image reconstructions of the cervical spine and maxillofacial structures were also generated. COMPARISON:  Cervical spine radiographs 03/26/2005. FINDINGS: CT HEAD FINDINGS Brain: Subdural hematoma along the interhemispheric fissure measuring up to 15 mm in thickness. This is associated with globular left superior parasagittal hyperdensity measuring 10-12 mm is over the left superior frontal gyrus as seen on series 203, image 35. Mild regional mass effect. No definite cerebral edema. Subdural hematoma tracks along the left tentorium measuring up to 2-3 mm in thickness there. There is also trace left lateral convexity subdural hematoma most  visible at the operculum and measuring 2-3 mm. No peripheral right side subdural. No other extra-axial hemorrhage identified. No midline shift. No ventriculomegaly. No cortically based acute infarct identified. Vascular: Dominant distal left vertebral artery. Calcified atherosclerosis at the skull base. Skull: Hyperostosis of the calvarium (  normal variant). No calvarium fracture identified. Facial fractures described below. Other: Anterior scalp and forehead superficial hematoma and subcutaneous gas in keeping with lacerations. The posterior scalp soft tissues appear normal. See facial findings below. CT MAXILLOFACIAL FINDINGS Osseous: Mandible intact.  No maxilla or zygoma fracture. Bilateral nasal bone fractures, mostly nondisplaced on the left and comminuted and mildly displaced on the right (series 302, image 37). There is also leftward deviation and angulation of the anterior bony nasal septum which could be acute. No central skullbase fracture. Orbits: No orbital wall fracture. Globes are intact. Post cataract surgery changes. No intraorbital hematoma or contusion. Sinuses: Mild bubbly opacity in the right sphenoid sinus. The other paranasal sinuses are clear. There is a small volume of blood or retained secretions in the nasal cavity. Tympanic cavities and mastoids are clear. Soft tissues: Soft tissue swelling overlying the knee nose. Broad-based forehead hematoma measuring up to 6 mm in thickness. Superimposed subcutaneous gas in keeping with associated lacerations. The lateral and more inferior face superficial soft tissues appear normal. Small volume retained secretions in the oropharynx. The adenoid soft tissue may be surgically absent. There is bulky lingual tonsillar hypertrophy. There does appear to be bilateral palatine tonsillar tissue present, with postinflammatory calcifications on the left. Negative visualized parapharyngeal, retropharyngeal and sublingual spaces. The submandibular glands appear  to be atrophied. Negative parotid glands. Coarsely calcified right level 2/level 3 lymph node. No superior neck lymphadenopathy. CT CERVICAL SPINE FINDINGS Alignment: Exaggerated upper cervical lordosis and straightening of lower cervical lordosis. Trace anterolisthesis of C6 on C7, and similar anterolisthesis of C7 on T1. These appear degenerative. There is ankylosis of the C5-C6 level which may be congenitally incompletely segmented. Bilateral posterior element alignment is within normal limits. Skull base and vertebrae: Degenerative changes at the anterior C1 odontoid and also the left lateral C1-C2 and occipital condyles articulations. Bulky ligamentous hypertrophy about the odontoid. Visualized skull base is intact. No atlanto-occipital dissociation. No acute cervical spine fracture identified. Soft tissues and spinal canal: No prevertebral fluid or swelling. No visible canal hematoma. Postinflammatory appearing densely calcified right neck lymph node. There is bilateral level 2 and/or level 3 lymph node enlargement up to 10-11 mm mm short axis. The bulky lingual and to a lesser extent palatine tonsillar enlargement is again noted. No lower cervical lymphadenopathy. Negative retropharyngeal space. Incidental retropharyngeal course of both common carotid arteries. Negative larynx. Diminutive thyroid. Bilateral carotid artery bifurcation. Disc levels: Possible mild degenerative cervical spinal stenosis at C4-C5. Widespread facet arthropathy. Upper chest: Mild respiratory motion artifact but negative visualized lung apices. Calcified aortic atherosclerosis. Visualized upper thoracic levels appear grossly intact. IMPRESSION: 1. Positive for acute intracranial hemorrhage. Left side predominantly parafalcine subdural hematoma measuring up to 15 in thickness along the interhemispheric fissure. Left tentorial and lateral subdural component measuring 2-3 mm in thickness. No significant intracranial mass effect or  midline shift at this time. 2. No other acute traumatic injury to the brain identified. 3. Right greater than left nasal bone fractures. Possible associated anterior nasal septal fracture. Overlying soft tissue swelling. 4. Anterior forehead and scalp soft tissue injury without underlying fracture. 5. No acute fracture or listhesis identified in the cervical spine. Advanced skullbase and cervical spine degenerative changes. 6. Nonspecific mild to moderate bilateral level 2/3 cervical lymphadenopathy in association with bulky enlargement of the lingual tonsil. Mild asymmetry of the palatine tonsils. Recommend outpatient follow-up neck CT with IV contrast to evaluate further. 7.  Calcified aortic atherosclerosis. Electronically Signed: By: [name]  M.D. On: 10/05/2016 18:28   Ct Shoulder Right Wo Contrast  Result Date: 10/06/2016 CLINICAL DATA:  Neer category 1 part fracture of right humerus. EXAM: CT OF THE UPPER RIGHT EXTREMITY WITHOUT CONTRAST TECHNIQUE: Multidetector CT imaging of the upper right extremity was performed according to the standard protocol. COMPARISON:  Radiographs from 10/05/2016 FINDINGS: Bones/Joint/Cartilage Neer category 1 part fracture of the proximal humerus is again identified with acute slightly impacted surgical neck fracture without significant angulation or displacement. Confirmation of a greater tuberosity fracture is noted on CT without significant displacement as well. Osteoarthritis of the [facility] and glenohumeral joints. The scapula is intact. Motion artifacts limit assessment of the ribs that are grossly and intact without significant displacement. Ligaments Suboptimally assessed by CT. Muscles and Tendons Supraspinous calcific tendinopathy is identified. Soft tissues No subacromial or subdeltoid bursal fluid. No significant joint effusion. IMPRESSION: 1. Neer category 1 part fracture of the proximal right humerus. Confirmation of fractures involving the surgical neck with slight  impaction as well as sagittal fracture undermining the greater tuberosity. Neither fracture fragment is angulated greater than 45 degrees nor displaced more than 1 cm. 2. AC and glenohumeral joint osteoarthritis. 3. Supraspinous calcific tendinopathy. Electronically Signed   By: [name] M.D.   On: 10/06/2016 20:31   Ct Maxillofacial Wo Cm  Addendum Date: 10/05/2016   ADDENDUM REPORT: 10/05/2016 18:47 ADDENDUM: Critical Value/emergent results were called by telephone at the time of interpretation on 10/05/2016 at 1830 hours to Dr. Theotis Burrow , who verbally acknowledged these results. Electronically Signed   By: [name] M.D.   On: 10/05/2016 18:47   Result Date: 10/05/2016 CLINICAL DATA:  83 year old female status post trip and fall on rough concrete outside this afternoon. Face and nose laceration. Ecchymoses. Initial encounter. EXAM: CT HEAD WITHOUT CONTRAST CT MAXILLOFACIAL WITHOUT CONTRAST CT CERVICAL SPINE WITHOUT CONTRAST TECHNIQUE: Multidetector CT imaging of the head, cervical spine, and maxillofacial structures were performed using the standard protocol without intravenous contrast. Multiplanar CT image reconstructions of the cervical spine and maxillofacial structures were also generated. COMPARISON:  Cervical spine radiographs 03/26/2005. FINDINGS: CT HEAD FINDINGS Brain: Subdural hematoma along the interhemispheric fissure measuring up to 15 mm in thickness. This is associated with globular left superior parasagittal hyperdensity measuring 10-12 mm is over the left superior frontal gyrus as seen on series 203, image 35. Mild regional mass effect. No definite cerebral edema. Subdural hematoma tracks along the left tentorium measuring up to 2-3 mm in thickness there. There is also trace left lateral convexity subdural hematoma most visible at the operculum and measuring 2-3 mm. No peripheral right side subdural. No other extra-axial hemorrhage identified. No midline shift. No ventriculomegaly. No  cortically based acute infarct identified. Vascular: Dominant distal left vertebral artery. Calcified atherosclerosis at the skull base. Skull: Hyperostosis of the calvarium (normal variant). No calvarium fracture identified. Facial fractures described below. Other: Anterior scalp and forehead superficial hematoma and subcutaneous gas in keeping with lacerations. The posterior scalp soft tissues appear normal. See facial findings below. CT MAXILLOFACIAL FINDINGS Osseous: Mandible intact.  No maxilla or zygoma fracture. Bilateral nasal bone fractures, mostly nondisplaced on the left and comminuted and mildly displaced on the right (series 302, image 37). There is also leftward deviation and angulation of the anterior bony nasal septum which could be acute. No central skullbase fracture. Orbits: No orbital wall fracture. Globes are intact. Post cataract surgery changes. No intraorbital hematoma or contusion. Sinuses: Mild bubbly opacity in the right sphenoid sinus. The  other paranasal sinuses are clear. There is a small volume of blood or retained secretions in the nasal cavity. Tympanic cavities and mastoids are clear. Soft tissues: Soft tissue swelling overlying the knee nose. Broad-based forehead hematoma measuring up to 6 mm in thickness. Superimposed subcutaneous gas in keeping with associated lacerations. The lateral and more inferior face superficial soft tissues appear normal. Small volume retained secretions in the oropharynx. The adenoid soft tissue may be surgically absent. There is bulky lingual tonsillar hypertrophy. There does appear to be bilateral palatine tonsillar tissue present, with postinflammatory calcifications on the left. Negative visualized parapharyngeal, retropharyngeal and sublingual spaces. The submandibular glands appear to be atrophied. Negative parotid glands. Coarsely calcified right level 2/level 3 lymph node. No superior neck lymphadenopathy. CT CERVICAL SPINE FINDINGS Alignment:  Exaggerated upper cervical lordosis and straightening of lower cervical lordosis. Trace anterolisthesis of C6 on C7, and similar anterolisthesis of C7 on T1. These appear degenerative. There is ankylosis of the C5-C6 level which may be congenitally incompletely segmented. Bilateral posterior element alignment is within normal limits. Skull base and vertebrae: Degenerative changes at the anterior C1 odontoid and also the left lateral C1-C2 and occipital condyles articulations. Bulky ligamentous hypertrophy about the odontoid. Visualized skull base is intact. No atlanto-occipital dissociation. No acute cervical spine fracture identified. Soft tissues and spinal canal: No prevertebral fluid or swelling. No visible canal hematoma. Postinflammatory appearing densely calcified right neck lymph node. There is bilateral level 2 and/or level 3 lymph node enlargement up to 10-11 mm mm short axis. The bulky lingual and to a lesser extent palatine tonsillar enlargement is again noted. No lower cervical lymphadenopathy. Negative retropharyngeal space. Incidental retropharyngeal course of both common carotid arteries. Negative larynx. Diminutive thyroid. Bilateral carotid artery bifurcation. Disc levels: Possible mild degenerative cervical spinal stenosis at C4-C5. Widespread facet arthropathy. Upper chest: Mild respiratory motion artifact but negative visualized lung apices. Calcified aortic atherosclerosis. Visualized upper thoracic levels appear grossly intact. IMPRESSION: 1. Positive for acute intracranial hemorrhage. Left side predominantly parafalcine subdural hematoma measuring up to 15 in thickness along the interhemispheric fissure. Left tentorial and lateral subdural component measuring 2-3 mm in thickness. No significant intracranial mass effect or midline shift at this time. 2. No other acute traumatic injury to the brain identified. 3. Right greater than left nasal bone fractures. Possible associated anterior nasal  septal fracture. Overlying soft tissue swelling. 4. Anterior forehead and scalp soft tissue injury without underlying fracture. 5. No acute fracture or listhesis identified in the cervical spine. Advanced skullbase and cervical spine degenerative changes. 6. Nonspecific mild to moderate bilateral level 2/3 cervical lymphadenopathy in association with bulky enlargement of the lingual tonsil. Mild asymmetry of the palatine tonsils. Recommend outpatient follow-up neck CT with IV contrast to evaluate further. 7.  Calcified aortic atherosclerosis. Electronically Signed: By: [name] M.D. On: 10/05/2016 18:28    Anti-infectives: Anti-infectives    None     Assessment/Plan Ground level fall TBI/L falcine-tent SDH - F/U CT yesterday 1/5 unchanged; TBI therapies/supportive care  R humeral neck Fx - Handy, MD; CT to evaluate possible free fragment at greater tuberosity; may follow up in office in 3-5 days if discharged this weekend. NWB R arm - gentle shoulder pendulums. Nasal Fx Redmond Baseman, MD; pt to follow up in office in 1 week  FEN: tolerating diet VTE: PAS Dispo:  Progressing well, working with PT; stable for discharge with appropriate follow up Lives with husband and son, son can provide some assistance at discharge Therapies  recommending [facility]/SL[facility]; supplies - 3 in 1 bedside commode    LOS: 0 days    Jill Alexanders , [facility] Surgery 10/07/2016, 9:48 AM Pager: [phone] Consults: [phone] Mon-Fri 7:00 am-4:30 pm Sat-Sun 7:00 am-11:30 am

## 2016-10-07 NOTE — Progress Notes (Signed)
Subjective: Patient resting in bed, reasonably comfortable. CT yesterday essentially unchanged, with moderate left interhemispheric and tentorial subdural hematoma.  Objective: Vital signs in last 24 hours: Vitals:   10/06/16 1710 10/06/16 2146 10/07/16 0114 10/07/16 0544  BP: (!) 149/70 (!) 157/67 (!) 136/53 (!) 149/66  Pulse: 77 79 79 80  Resp:  18 16 18   Temp: 98.4 F (36.9 C) 98.4 F (36.9 C) 98.3 F (36.8 C) 98.5 F (36.9 C)  TempSrc: Oral Oral Oral Oral  SpO2: 97% 94% 94% 95%  Weight:      Height:        Intake/Output from previous day: 01/05 0701 - 01/06 0700 In: 250 [P.O.:240; I.V.:10] Out: 1025 [Urine:1025] Intake/Output this shift: No intake/output data recorded.  Physical Exam:  Awake alert, oriented to name, hospital, and January 2018. Following commands. Speech fluent. Moving all 4 extremities. EOMI. Facial movements symmetrical.  CBC  Recent Labs  10/05/16 1910 10/06/16 0328  WBC 14.7* 12.8*  HGB 14.4 12.8  HCT 42.8 37.9  PLT 237 250   BMET  Recent Labs  10/05/16 1910 10/06/16 0328  NA 136 135  K 3.8 3.6  CL 104 102  CO2 22 23  GLUCOSE 143* 110*  BUN 9 5*  CREATININE 0.51 0.47  CALCIUM 9.3 9.0   ABG No results found for: PHART, PCO2ART, PO2ART, HCO3, TCO2, ACIDBASEDEF, O2SAT  Studies/Results: Dg Shoulder Right  Result Date: 10/05/2016 CLINICAL DATA:  Right shoulder pain and deformity after fall. Is fallen are EXAM: RIGHT SHOULDER - 2+ VIEW COMPARISON:  None. FINDINGS: There is an acute, closed, transverse fracture through the surgical neck of the humerus. Equivocal greater tuberosity fracture suggested on the AP view of the shoulder not confirmed however on the scapular Y-views. No significant angulation or displacement. The scapula, AC joint, visualized right ribs and lung are unremarkable. No malalignment of the glenohumeral joint. IMPRESSION: Neer category 1 part fracture of the surgical neck of the right humerus without significant  angulation nor displacement. Equivocal greater tuberosity fracture is also suggested on the AP view but not confirmed on the lateral projections. Electronically Signed   By: [name] M.D.   On: 10/05/2016 18:35   Ct Head Without Contrast  Result Date: 10/06/2016 CLINICAL DATA:  Followup subdural hematoma. EXAM: CT HEAD WITHOUT CONTRAST TECHNIQUE: Contiguous axial images were obtained from the base of the skull through the vertex without intravenous contrast. COMPARISON:  CT HEAD October 05, 2016 FINDINGS: BRAIN: The ventricles and sulci are normal for age, similar asymmetrically prominent RIGHT ventricle atrium. No intraparenchymal hemorrhage, mass effect nor midline shift. Patchy supratentorial white matter hypodensities within normal range for patient's age, though non-specific are most compatible with chronic small vessel ischemic disease. No acute large vascular territory infarcts. Similar dense 13 mm at LEFT parafalcine subdural hematoma extending to LEFT cerebellar tentorium. Small LEFT frontal convexity extra-axial blood products are similar. Basal cisterns are patent. VASCULAR: Moderate calcific atherosclerosis of the carotid siphons. SKULL: No skull fracture. Acute bilateral nasal bone fractures. Large bifrontal scalp hematoma without subcutaneous gas or radiopaque foreign bodies. SINUSES/ORBITS: The mastoid air-cells and included paranasal sinuses are well-aerated.Status post bilateral ocular lens implants. The included ocular globes and orbital contents are non-suspicious. OTHER: Patient is edentulous. IMPRESSION: Stable 13 mm LEFT falcotentorial subdural hematoma. Small amount of LEFT frontal convexity extra-axial blood products. Large frontal scalp hematoma without skull fracture. Acute nasal bone fractures. Electronically Signed   By: Elon Alas M.D.   On: 10/06/2016 04:36  Ct Head Wo Contrast  Addendum Date: 10/05/2016   ADDENDUM REPORT: 10/05/2016 18:47 ADDENDUM: Critical  Value/emergent results were called by telephone at the time of interpretation on 10/05/2016 at 1830 hours to Dr. Theotis Burrow , who verbally acknowledged these results. Electronically Signed   By: [name] M.D.   On: 10/05/2016 18:47   Result Date: 10/05/2016 CLINICAL DATA:  83 year old female status post trip and fall on rough concrete outside this afternoon. Face and nose laceration. Ecchymoses. Initial encounter. EXAM: CT HEAD WITHOUT CONTRAST CT MAXILLOFACIAL WITHOUT CONTRAST CT CERVICAL SPINE WITHOUT CONTRAST TECHNIQUE: Multidetector CT imaging of the head, cervical spine, and maxillofacial structures were performed using the standard protocol without intravenous contrast. Multiplanar CT image reconstructions of the cervical spine and maxillofacial structures were also generated. COMPARISON:  Cervical spine radiographs 03/26/2005. FINDINGS: CT HEAD FINDINGS Brain: Subdural hematoma along the interhemispheric fissure measuring up to 15 mm in thickness. This is associated with globular left superior parasagittal hyperdensity measuring 10-12 mm is over the left superior frontal gyrus as seen on series 203, image 35. Mild regional mass effect. No definite cerebral edema. Subdural hematoma tracks along the left tentorium measuring up to 2-3 mm in thickness there. There is also trace left lateral convexity subdural hematoma most visible at the operculum and measuring 2-3 mm. No peripheral right side subdural. No other extra-axial hemorrhage identified. No midline shift. No ventriculomegaly. No cortically based acute infarct identified. Vascular: Dominant distal left vertebral artery. Calcified atherosclerosis at the skull base. Skull: Hyperostosis of the calvarium (normal variant). No calvarium fracture identified. Facial fractures described below. Other: Anterior scalp and forehead superficial hematoma and subcutaneous gas in keeping with lacerations. The posterior scalp soft tissues appear normal. See facial  findings below. CT MAXILLOFACIAL FINDINGS Osseous: Mandible intact.  No maxilla or zygoma fracture. Bilateral nasal bone fractures, mostly nondisplaced on the left and comminuted and mildly displaced on the right (series 302, image 37). There is also leftward deviation and angulation of the anterior bony nasal septum which could be acute. No central skullbase fracture. Orbits: No orbital wall fracture. Globes are intact. Post cataract surgery changes. No intraorbital hematoma or contusion. Sinuses: Mild bubbly opacity in the right sphenoid sinus. The other paranasal sinuses are clear. There is a small volume of blood or retained secretions in the nasal cavity. Tympanic cavities and mastoids are clear. Soft tissues: Soft tissue swelling overlying the knee nose. Broad-based forehead hematoma measuring up to 6 mm in thickness. Superimposed subcutaneous gas in keeping with associated lacerations. The lateral and more inferior face superficial soft tissues appear normal. Small volume retained secretions in the oropharynx. The adenoid soft tissue may be surgically absent. There is bulky lingual tonsillar hypertrophy. There does appear to be bilateral palatine tonsillar tissue present, with postinflammatory calcifications on the left. Negative visualized parapharyngeal, retropharyngeal and sublingual spaces. The submandibular glands appear to be atrophied. Negative parotid glands. Coarsely calcified right level 2/level 3 lymph node. No superior neck lymphadenopathy. CT CERVICAL SPINE FINDINGS Alignment: Exaggerated upper cervical lordosis and straightening of lower cervical lordosis. Trace anterolisthesis of C6 on C7, and similar anterolisthesis of C7 on T1. These appear degenerative. There is ankylosis of the C5-C6 level which may be congenitally incompletely segmented. Bilateral posterior element alignment is within normal limits. Skull base and vertebrae: Degenerative changes at the anterior C1 odontoid and also the  left lateral C1-C2 and occipital condyles articulations. Bulky ligamentous hypertrophy about the odontoid. Visualized skull base is intact. No atlanto-occipital dissociation. No acute cervical  spine fracture identified. Soft tissues and spinal canal: No prevertebral fluid or swelling. No visible canal hematoma. Postinflammatory appearing densely calcified right neck lymph node. There is bilateral level 2 and/or level 3 lymph node enlargement up to 10-11 mm mm short axis. The bulky lingual and to a lesser extent palatine tonsillar enlargement is again noted. No lower cervical lymphadenopathy. Negative retropharyngeal space. Incidental retropharyngeal course of both common carotid arteries. Negative larynx. Diminutive thyroid. Bilateral carotid artery bifurcation. Disc levels: Possible mild degenerative cervical spinal stenosis at C4-C5. Widespread facet arthropathy. Upper chest: Mild respiratory motion artifact but negative visualized lung apices. Calcified aortic atherosclerosis. Visualized upper thoracic levels appear grossly intact. IMPRESSION: 1. Positive for acute intracranial hemorrhage. Left side predominantly parafalcine subdural hematoma measuring up to 15 in thickness along the interhemispheric fissure. Left tentorial and lateral subdural component measuring 2-3 mm in thickness. No significant intracranial mass effect or midline shift at this time. 2. No other acute traumatic injury to the brain identified. 3. Right greater than left nasal bone fractures. Possible associated anterior nasal septal fracture. Overlying soft tissue swelling. 4. Anterior forehead and scalp soft tissue injury without underlying fracture. 5. No acute fracture or listhesis identified in the cervical spine. Advanced skullbase and cervical spine degenerative changes. 6. Nonspecific mild to moderate bilateral level 2/3 cervical lymphadenopathy in association with bulky enlargement of the lingual tonsil. Mild asymmetry of the palatine  tonsils. Recommend outpatient follow-up neck CT with IV contrast to evaluate further. 7.  Calcified aortic atherosclerosis. Electronically Signed: By: [name] M.D. On: 10/05/2016 18:28   Ct Cervical Spine Wo Contrast  Addendum Date: 10/05/2016   ADDENDUM REPORT: 10/05/2016 18:47 ADDENDUM: Critical Value/emergent results were called by telephone at the time of interpretation on 10/05/2016 at 1830 hours to Dr. Theotis Burrow , who verbally acknowledged these results. Electronically Signed   By: [name] M.D.   On: 10/05/2016 18:47   Result Date: 10/05/2016 CLINICAL DATA:  83 year old female status post trip and fall on rough concrete outside this afternoon. Face and nose laceration. Ecchymoses. Initial encounter. EXAM: CT HEAD WITHOUT CONTRAST CT MAXILLOFACIAL WITHOUT CONTRAST CT CERVICAL SPINE WITHOUT CONTRAST TECHNIQUE: Multidetector CT imaging of the head, cervical spine, and maxillofacial structures were performed using the standard protocol without intravenous contrast. Multiplanar CT image reconstructions of the cervical spine and maxillofacial structures were also generated. COMPARISON:  Cervical spine radiographs 03/26/2005. FINDINGS: CT HEAD FINDINGS Brain: Subdural hematoma along the interhemispheric fissure measuring up to 15 mm in thickness. This is associated with globular left superior parasagittal hyperdensity measuring 10-12 mm is over the left superior frontal gyrus as seen on series 203, image 35. Mild regional mass effect. No definite cerebral edema. Subdural hematoma tracks along the left tentorium measuring up to 2-3 mm in thickness there. There is also trace left lateral convexity subdural hematoma most visible at the operculum and measuring 2-3 mm. No peripheral right side subdural. No other extra-axial hemorrhage identified. No midline shift. No ventriculomegaly. No cortically based acute infarct identified. Vascular: Dominant distal left vertebral artery. Calcified atherosclerosis at the skull  base. Skull: Hyperostosis of the calvarium (normal variant). No calvarium fracture identified. Facial fractures described below. Other: Anterior scalp and forehead superficial hematoma and subcutaneous gas in keeping with lacerations. The posterior scalp soft tissues appear normal. See facial findings below. CT MAXILLOFACIAL FINDINGS Osseous: Mandible intact.  No maxilla or zygoma fracture. Bilateral nasal bone fractures, mostly nondisplaced on the left and comminuted and mildly displaced on the right (series 302,  image 37). There is also leftward deviation and angulation of the anterior bony nasal septum which could be acute. No central skullbase fracture. Orbits: No orbital wall fracture. Globes are intact. Post cataract surgery changes. No intraorbital hematoma or contusion. Sinuses: Mild bubbly opacity in the right sphenoid sinus. The other paranasal sinuses are clear. There is a small volume of blood or retained secretions in the nasal cavity. Tympanic cavities and mastoids are clear. Soft tissues: Soft tissue swelling overlying the knee nose. Broad-based forehead hematoma measuring up to 6 mm in thickness. Superimposed subcutaneous gas in keeping with associated lacerations. The lateral and more inferior face superficial soft tissues appear normal. Small volume retained secretions in the oropharynx. The adenoid soft tissue may be surgically absent. There is bulky lingual tonsillar hypertrophy. There does appear to be bilateral palatine tonsillar tissue present, with postinflammatory calcifications on the left. Negative visualized parapharyngeal, retropharyngeal and sublingual spaces. The submandibular glands appear to be atrophied. Negative parotid glands. Coarsely calcified right level 2/level 3 lymph node. No superior neck lymphadenopathy. CT CERVICAL SPINE FINDINGS Alignment: Exaggerated upper cervical lordosis and straightening of lower cervical lordosis. Trace anterolisthesis of C6 on C7, and similar  anterolisthesis of C7 on T1. These appear degenerative. There is ankylosis of the C5-C6 level which may be congenitally incompletely segmented. Bilateral posterior element alignment is within normal limits. Skull base and vertebrae: Degenerative changes at the anterior C1 odontoid and also the left lateral C1-C2 and occipital condyles articulations. Bulky ligamentous hypertrophy about the odontoid. Visualized skull base is intact. No atlanto-occipital dissociation. No acute cervical spine fracture identified. Soft tissues and spinal canal: No prevertebral fluid or swelling. No visible canal hematoma. Postinflammatory appearing densely calcified right neck lymph node. There is bilateral level 2 and/or level 3 lymph node enlargement up to 10-11 mm mm short axis. The bulky lingual and to a lesser extent palatine tonsillar enlargement is again noted. No lower cervical lymphadenopathy. Negative retropharyngeal space. Incidental retropharyngeal course of both common carotid arteries. Negative larynx. Diminutive thyroid. Bilateral carotid artery bifurcation. Disc levels: Possible mild degenerative cervical spinal stenosis at C4-C5. Widespread facet arthropathy. Upper chest: Mild respiratory motion artifact but negative visualized lung apices. Calcified aortic atherosclerosis. Visualized upper thoracic levels appear grossly intact. IMPRESSION: 1. Positive for acute intracranial hemorrhage. Left side predominantly parafalcine subdural hematoma measuring up to 15 in thickness along the interhemispheric fissure. Left tentorial and lateral subdural component measuring 2-3 mm in thickness. No significant intracranial mass effect or midline shift at this time. 2. No other acute traumatic injury to the brain identified. 3. Right greater than left nasal bone fractures. Possible associated anterior nasal septal fracture. Overlying soft tissue swelling. 4. Anterior forehead and scalp soft tissue injury without underlying fracture. 5.  No acute fracture or listhesis identified in the cervical spine. Advanced skullbase and cervical spine degenerative changes. 6. Nonspecific mild to moderate bilateral level 2/3 cervical lymphadenopathy in association with bulky enlargement of the lingual tonsil. Mild asymmetry of the palatine tonsils. Recommend outpatient follow-up neck CT with IV contrast to evaluate further. 7.  Calcified aortic atherosclerosis. Electronically Signed: By: [name] M.D. On: 10/05/2016 18:28   Ct Shoulder Right Wo Contrast  Result Date: 10/06/2016 CLINICAL DATA:  Neer category 1 part fracture of right humerus. EXAM: CT OF THE UPPER RIGHT EXTREMITY WITHOUT CONTRAST TECHNIQUE: Multidetector CT imaging of the upper right extremity was performed according to the standard protocol. COMPARISON:  Radiographs from 10/05/2016 FINDINGS: Bones/Joint/Cartilage Neer category 1 part fracture of the proximal  humerus is again identified with acute slightly impacted surgical neck fracture without significant angulation or displacement. Confirmation of a greater tuberosity fracture is noted on CT without significant displacement as well. Osteoarthritis of the [facility] and glenohumeral joints. The scapula is intact. Motion artifacts limit assessment of the ribs that are grossly and intact without significant displacement. Ligaments Suboptimally assessed by CT. Muscles and Tendons Supraspinous calcific tendinopathy is identified. Soft tissues No subacromial or subdeltoid bursal fluid. No significant joint effusion. IMPRESSION: 1. Neer category 1 part fracture of the proximal right humerus. Confirmation of fractures involving the surgical neck with slight impaction as well as sagittal fracture undermining the greater tuberosity. Neither fracture fragment is angulated greater than 45 degrees nor displaced more than 1 cm. 2. AC and glenohumeral joint osteoarthritis. 3. Supraspinous calcific tendinopathy. Electronically Signed   By: [name] M.D.   On:  10/06/2016 20:31   Ct Maxillofacial Wo Cm  Addendum Date: 10/05/2016   ADDENDUM REPORT: 10/05/2016 18:47 ADDENDUM: Critical Value/emergent results were called by telephone at the time of interpretation on 10/05/2016 at 1830 hours to Dr. Theotis Burrow , who verbally acknowledged these results. Electronically Signed   By: [name] M.D.   On: 10/05/2016 18:47   Result Date: 10/05/2016 CLINICAL DATA:  83 year old female status post trip and fall on rough concrete outside this afternoon. Face and nose laceration. Ecchymoses. Initial encounter. EXAM: CT HEAD WITHOUT CONTRAST CT MAXILLOFACIAL WITHOUT CONTRAST CT CERVICAL SPINE WITHOUT CONTRAST TECHNIQUE: Multidetector CT imaging of the head, cervical spine, and maxillofacial structures were performed using the standard protocol without intravenous contrast. Multiplanar CT image reconstructions of the cervical spine and maxillofacial structures were also generated. COMPARISON:  Cervical spine radiographs 03/26/2005. FINDINGS: CT HEAD FINDINGS Brain: Subdural hematoma along the interhemispheric fissure measuring up to 15 mm in thickness. This is associated with globular left superior parasagittal hyperdensity measuring 10-12 mm is over the left superior frontal gyrus as seen on series 203, image 35. Mild regional mass effect. No definite cerebral edema. Subdural hematoma tracks along the left tentorium measuring up to 2-3 mm in thickness there. There is also trace left lateral convexity subdural hematoma most visible at the operculum and measuring 2-3 mm. No peripheral right side subdural. No other extra-axial hemorrhage identified. No midline shift. No ventriculomegaly. No cortically based acute infarct identified. Vascular: Dominant distal left vertebral artery. Calcified atherosclerosis at the skull base. Skull: Hyperostosis of the calvarium (normal variant). No calvarium fracture identified. Facial fractures described below. Other: Anterior scalp and forehead  superficial hematoma and subcutaneous gas in keeping with lacerations. The posterior scalp soft tissues appear normal. See facial findings below. CT MAXILLOFACIAL FINDINGS Osseous: Mandible intact.  No maxilla or zygoma fracture. Bilateral nasal bone fractures, mostly nondisplaced on the left and comminuted and mildly displaced on the right (series 302, image 37). There is also leftward deviation and angulation of the anterior bony nasal septum which could be acute. No central skullbase fracture. Orbits: No orbital wall fracture. Globes are intact. Post cataract surgery changes. No intraorbital hematoma or contusion. Sinuses: Mild bubbly opacity in the right sphenoid sinus. The other paranasal sinuses are clear. There is a small volume of blood or retained secretions in the nasal cavity. Tympanic cavities and mastoids are clear. Soft tissues: Soft tissue swelling overlying the knee nose. Broad-based forehead hematoma measuring up to 6 mm in thickness. Superimposed subcutaneous gas in keeping with associated lacerations. The lateral and more inferior face superficial soft tissues appear normal. Small volume retained  secretions in the oropharynx. The adenoid soft tissue may be surgically absent. There is bulky lingual tonsillar hypertrophy. There does appear to be bilateral palatine tonsillar tissue present, with postinflammatory calcifications on the left. Negative visualized parapharyngeal, retropharyngeal and sublingual spaces. The submandibular glands appear to be atrophied. Negative parotid glands. Coarsely calcified right level 2/level 3 lymph node. No superior neck lymphadenopathy. CT CERVICAL SPINE FINDINGS Alignment: Exaggerated upper cervical lordosis and straightening of lower cervical lordosis. Trace anterolisthesis of C6 on C7, and similar anterolisthesis of C7 on T1. These appear degenerative. There is ankylosis of the C5-C6 level which may be congenitally incompletely segmented. Bilateral posterior  element alignment is within normal limits. Skull base and vertebrae: Degenerative changes at the anterior C1 odontoid and also the left lateral C1-C2 and occipital condyles articulations. Bulky ligamentous hypertrophy about the odontoid. Visualized skull base is intact. No atlanto-occipital dissociation. No acute cervical spine fracture identified. Soft tissues and spinal canal: No prevertebral fluid or swelling. No visible canal hematoma. Postinflammatory appearing densely calcified right neck lymph node. There is bilateral level 2 and/or level 3 lymph node enlargement up to 10-11 mm mm short axis. The bulky lingual and to a lesser extent palatine tonsillar enlargement is again noted. No lower cervical lymphadenopathy. Negative retropharyngeal space. Incidental retropharyngeal course of both common carotid arteries. Negative larynx. Diminutive thyroid. Bilateral carotid artery bifurcation. Disc levels: Possible mild degenerative cervical spinal stenosis at C4-C5. Widespread facet arthropathy. Upper chest: Mild respiratory motion artifact but negative visualized lung apices. Calcified aortic atherosclerosis. Visualized upper thoracic levels appear grossly intact. IMPRESSION: 1. Positive for acute intracranial hemorrhage. Left side predominantly parafalcine subdural hematoma measuring up to 15 in thickness along the interhemispheric fissure. Left tentorial and lateral subdural component measuring 2-3 mm in thickness. No significant intracranial mass effect or midline shift at this time. 2. No other acute traumatic injury to the brain identified. 3. Right greater than left nasal bone fractures. Possible associated anterior nasal septal fracture. Overlying soft tissue swelling. 4. Anterior forehead and scalp soft tissue injury without underlying fracture. 5. No acute fracture or listhesis identified in the cervical spine. Advanced skullbase and cervical spine degenerative changes. 6. Nonspecific mild to moderate  bilateral level 2/3 cervical lymphadenopathy in association with bulky enlargement of the lingual tonsil. Mild asymmetry of the palatine tonsils. Recommend outpatient follow-up neck CT with IV contrast to evaluate further. 7.  Calcified aortic atherosclerosis. Electronically Signed: By: [name] M.D. On: 10/05/2016 18:28    Assessment/Plan: Neurosurgically stable. Continue supportive care.   Hosie Spangle, MD 10/07/2016, 9:01 AM

## 2016-10-07 NOTE — Progress Notes (Signed)
Patient discharged home with husband, and son. [facility] delivered a 3-1 BSC, walker, and home health was set up. Discharge instructions were reviewed with husband, and son. Husband and son verbalized understanding.

## 2016-10-07 NOTE — Care Management Note (Signed)
Case Management Note  Patient Details  Name: Bellamie Capotosto MRN: [id number] Date of Birth: 11-27-1930  Subjective/Objective:                  fall  Action/Plan: Discharge planning Expected Discharge Date:  10/07/16               Expected Discharge Plan:  [place]  In-House Referral:     Discharge planning Services  CM Consult  Post Acute Care Choice:  Home Health Choice offered to:  Patient  DME Arranged:  3-N-1 DME Agency:  [place]:  PT, OT, Speech Therapy [place] Agency:  [place]  Status of Service:  Completed, signed off  If discussed at IXL of Stay Meetings, dates discussed:    Additional Comments: CM spoke with pt and family to offer choice of home health agency.  Family cnhoose [facility] to render HHPT/OT/SLP. Referral called to [facility] rep, Jermaine.  CM notified [place] DME rep, Reggie to please deliver the 3n1 to room prior to discharge.  No other CM needs were communicated. Dellie Catholic, RN 10/07/2016, 3:13 PM

## 2016-10-09 DIAGNOSIS — S065X0D Traumatic subdural hemorrhage without loss of consciousness, subsequent encounter: Secondary | ICD-10-CM | POA: Diagnosis not present

## 2016-10-09 DIAGNOSIS — S42201D Unspecified fracture of upper end of right humerus, subsequent encounter for fracture with routine healing: Secondary | ICD-10-CM | POA: Diagnosis not present

## 2016-10-09 DIAGNOSIS — W010XXD Fall on same level from slipping, tripping and stumbling without subsequent striking against object, subsequent encounter: Secondary | ICD-10-CM | POA: Diagnosis not present

## 2016-10-09 DIAGNOSIS — S022XXD Fracture of nasal bones, subsequent encounter for fracture with routine healing: Secondary | ICD-10-CM | POA: Diagnosis not present

## 2016-10-09 DIAGNOSIS — I10 Essential (primary) hypertension: Secondary | ICD-10-CM | POA: Diagnosis not present

## 2016-10-09 NOTE — Progress Notes (Signed)
OT NOTE- LATE GCODE ENTRY    10/06/16 1600  OT G-codes **NOT FOR INPATIENT CLASS**  Functional Assessment Tool Used clincial judgement  Functional Limitation Self care  Self Care Current Status [id number]) CK  Self Care Goal Status [id number]) Elsie Stain   OTR/L Pager: [phone] Office: [phone] .

## 2016-10-10 DIAGNOSIS — S42201D Unspecified fracture of upper end of right humerus, subsequent encounter for fracture with routine healing: Secondary | ICD-10-CM | POA: Diagnosis not present

## 2016-10-10 DIAGNOSIS — S065X0D Traumatic subdural hemorrhage without loss of consciousness, subsequent encounter: Secondary | ICD-10-CM | POA: Diagnosis not present

## 2016-10-10 DIAGNOSIS — W010XXD Fall on same level from slipping, tripping and stumbling without subsequent striking against object, subsequent encounter: Secondary | ICD-10-CM | POA: Diagnosis not present

## 2016-10-10 DIAGNOSIS — S022XXD Fracture of nasal bones, subsequent encounter for fracture with routine healing: Secondary | ICD-10-CM | POA: Diagnosis not present

## 2016-10-10 DIAGNOSIS — I10 Essential (primary) hypertension: Secondary | ICD-10-CM | POA: Diagnosis not present

## 2016-10-11 DIAGNOSIS — S022XXD Fracture of nasal bones, subsequent encounter for fracture with routine healing: Secondary | ICD-10-CM | POA: Insufficient documentation

## 2016-10-11 DIAGNOSIS — I10 Essential (primary) hypertension: Secondary | ICD-10-CM | POA: Diagnosis not present

## 2016-10-11 DIAGNOSIS — S42201D Unspecified fracture of upper end of right humerus, subsequent encounter for fracture with routine healing: Secondary | ICD-10-CM | POA: Diagnosis not present

## 2016-10-11 DIAGNOSIS — W010XXD Fall on same level from slipping, tripping and stumbling without subsequent striking against object, subsequent encounter: Secondary | ICD-10-CM | POA: Diagnosis not present

## 2016-10-11 DIAGNOSIS — S065X0D Traumatic subdural hemorrhage without loss of consciousness, subsequent encounter: Secondary | ICD-10-CM | POA: Diagnosis not present

## 2016-10-11 DIAGNOSIS — S42231D 3-part fracture of surgical neck of right humerus, subsequent encounter for fracture with routine healing: Secondary | ICD-10-CM | POA: Diagnosis not present

## 2016-10-11 NOTE — Progress Notes (Signed)
SLP addendum    10/06/16 1513  SLP G-Codes **NOT FOR INPATIENT CLASS**  Functional Assessment Tool Used skilled clinical judgement  Functional Limitations Spoken language expressive  Spoken Language Expression Current Status [phone]) CH  Spoken Language Expression Goal Status [id number]) [place]  Spoken Language Expression Discharge Status [id number]) Luis Llorens Torres  SLP Evaluations  $ SLP Speech Visit 1 Procedure  SLP Evaluations  $ SLP EVAL LANGUAGE/SOUND PRODUCTION 1 Procedure    Orbie Pyo Anja Neuzil M.Ed [employer] [phone]

## 2016-10-13 DIAGNOSIS — S42201D Unspecified fracture of upper end of right humerus, subsequent encounter for fracture with routine healing: Secondary | ICD-10-CM | POA: Diagnosis not present

## 2016-10-13 DIAGNOSIS — S022XXD Fracture of nasal bones, subsequent encounter for fracture with routine healing: Secondary | ICD-10-CM | POA: Diagnosis not present

## 2016-10-13 DIAGNOSIS — S065X0D Traumatic subdural hemorrhage without loss of consciousness, subsequent encounter: Secondary | ICD-10-CM | POA: Diagnosis not present

## 2016-10-13 DIAGNOSIS — I10 Essential (primary) hypertension: Secondary | ICD-10-CM | POA: Diagnosis not present

## 2016-10-13 DIAGNOSIS — W010XXD Fall on same level from slipping, tripping and stumbling without subsequent striking against object, subsequent encounter: Secondary | ICD-10-CM | POA: Diagnosis not present

## 2016-10-17 DIAGNOSIS — S022XXD Fracture of nasal bones, subsequent encounter for fracture with routine healing: Secondary | ICD-10-CM | POA: Diagnosis not present

## 2016-10-17 DIAGNOSIS — S42201D Unspecified fracture of upper end of right humerus, subsequent encounter for fracture with routine healing: Secondary | ICD-10-CM | POA: Diagnosis not present

## 2016-10-17 DIAGNOSIS — W010XXD Fall on same level from slipping, tripping and stumbling without subsequent striking against object, subsequent encounter: Secondary | ICD-10-CM | POA: Diagnosis not present

## 2016-10-17 DIAGNOSIS — I10 Essential (primary) hypertension: Secondary | ICD-10-CM | POA: Diagnosis not present

## 2016-10-17 DIAGNOSIS — S065X0D Traumatic subdural hemorrhage without loss of consciousness, subsequent encounter: Secondary | ICD-10-CM | POA: Diagnosis not present

## 2016-10-19 DIAGNOSIS — I10 Essential (primary) hypertension: Secondary | ICD-10-CM | POA: Diagnosis not present

## 2016-10-19 DIAGNOSIS — S42201D Unspecified fracture of upper end of right humerus, subsequent encounter for fracture with routine healing: Secondary | ICD-10-CM | POA: Diagnosis not present

## 2016-10-19 DIAGNOSIS — S065X0D Traumatic subdural hemorrhage without loss of consciousness, subsequent encounter: Secondary | ICD-10-CM | POA: Diagnosis not present

## 2016-10-19 DIAGNOSIS — S022XXD Fracture of nasal bones, subsequent encounter for fracture with routine healing: Secondary | ICD-10-CM | POA: Diagnosis not present

## 2016-10-19 DIAGNOSIS — W010XXD Fall on same level from slipping, tripping and stumbling without subsequent striking against object, subsequent encounter: Secondary | ICD-10-CM | POA: Diagnosis not present

## 2016-10-20 DIAGNOSIS — W010XXD Fall on same level from slipping, tripping and stumbling without subsequent striking against object, subsequent encounter: Secondary | ICD-10-CM | POA: Diagnosis not present

## 2016-10-20 DIAGNOSIS — S022XXD Fracture of nasal bones, subsequent encounter for fracture with routine healing: Secondary | ICD-10-CM | POA: Diagnosis not present

## 2016-10-20 DIAGNOSIS — S065X0D Traumatic subdural hemorrhage without loss of consciousness, subsequent encounter: Secondary | ICD-10-CM | POA: Diagnosis not present

## 2016-10-20 DIAGNOSIS — I10 Essential (primary) hypertension: Secondary | ICD-10-CM | POA: Diagnosis not present

## 2016-10-20 DIAGNOSIS — S42201D Unspecified fracture of upper end of right humerus, subsequent encounter for fracture with routine healing: Secondary | ICD-10-CM | POA: Diagnosis not present

## 2016-10-21 DIAGNOSIS — S42201D Unspecified fracture of upper end of right humerus, subsequent encounter for fracture with routine healing: Secondary | ICD-10-CM | POA: Diagnosis not present

## 2016-10-21 DIAGNOSIS — W010XXD Fall on same level from slipping, tripping and stumbling without subsequent striking against object, subsequent encounter: Secondary | ICD-10-CM | POA: Diagnosis not present

## 2016-10-21 DIAGNOSIS — S022XXD Fracture of nasal bones, subsequent encounter for fracture with routine healing: Secondary | ICD-10-CM | POA: Diagnosis not present

## 2016-10-21 DIAGNOSIS — S065X0D Traumatic subdural hemorrhage without loss of consciousness, subsequent encounter: Secondary | ICD-10-CM | POA: Diagnosis not present

## 2016-10-21 DIAGNOSIS — I10 Essential (primary) hypertension: Secondary | ICD-10-CM | POA: Diagnosis not present

## 2016-10-23 DIAGNOSIS — I10 Essential (primary) hypertension: Secondary | ICD-10-CM | POA: Diagnosis not present

## 2016-10-23 DIAGNOSIS — S42201D Unspecified fracture of upper end of right humerus, subsequent encounter for fracture with routine healing: Secondary | ICD-10-CM | POA: Diagnosis not present

## 2016-10-23 DIAGNOSIS — S065X0D Traumatic subdural hemorrhage without loss of consciousness, subsequent encounter: Secondary | ICD-10-CM | POA: Diagnosis not present

## 2016-10-23 DIAGNOSIS — W010XXD Fall on same level from slipping, tripping and stumbling without subsequent striking against object, subsequent encounter: Secondary | ICD-10-CM | POA: Diagnosis not present

## 2016-10-23 DIAGNOSIS — S022XXD Fracture of nasal bones, subsequent encounter for fracture with routine healing: Secondary | ICD-10-CM | POA: Diagnosis not present

## 2016-10-24 DIAGNOSIS — S065X0D Traumatic subdural hemorrhage without loss of consciousness, subsequent encounter: Secondary | ICD-10-CM | POA: Diagnosis not present

## 2016-10-24 DIAGNOSIS — I10 Essential (primary) hypertension: Secondary | ICD-10-CM | POA: Diagnosis not present

## 2016-10-24 DIAGNOSIS — S022XXD Fracture of nasal bones, subsequent encounter for fracture with routine healing: Secondary | ICD-10-CM | POA: Diagnosis not present

## 2016-10-24 DIAGNOSIS — W010XXD Fall on same level from slipping, tripping and stumbling without subsequent striking against object, subsequent encounter: Secondary | ICD-10-CM | POA: Diagnosis not present

## 2016-10-24 DIAGNOSIS — S42201D Unspecified fracture of upper end of right humerus, subsequent encounter for fracture with routine healing: Secondary | ICD-10-CM | POA: Diagnosis not present

## 2016-10-24 NOTE — Discharge Summary (Signed)
[facility] Discharge Summary   Patient ID: Linda Copeland MRN: [id number] DOB/AGE: 1931-01-04 83 y.o.  Admit date: 10/05/2016 Discharge date: 10/07/2016  Admitting Diagnosis: Fall Subdural hematoma  Discharge Diagnosis Patient Active Problem List   Diagnosis Date Noted  . Subdural hematoma ([place]) 10/05/2016    Consultants Orthopedic surgery - Marcelino Scot, MD ENT - Redmond Baseman, MD Neurosurgery - Saintclair Halsted, MD  Imaging: 10/05/16 CT HEAD/C-SPINE/MAXILLOFACIAL W/O CONTRAST -  1. Positive for acute intracranial hemorrhage. Left side predominantly parafalcine subdural hematoma measuring up to 15 in thickness along the interhemispheric fissure. Left tentorial and lateral subdural component measuring 2-3 mm in thickness. No significant intracranial mass effect or midline shift at this time. 2. No other acute traumatic injury to the brain identified. 3. Right greater than left nasal bone fractures. Possible associated anterior nasal septal fracture. Overlying soft tissue swelling. 4. Anterior forehead and scalp soft tissue injury without underlying fracture. 5. No acute fracture or listhesis identified in the cervical spine. Advanced skullbase and cervical spine degenerative changes.  10/05/16 DG SHOULDER, RIGHT: Neer category 1 part fracture of the surgical neck of the right humerus without significant angulation nor displacement. Equivocal greater tuberosity fracture is also suggested on the AP view but not confirmed on the lateral projections.  10/06/16 CT HEAD W/O CONTRAST -  Stable 13 mm LEFT falcotentorial subdural hematoma. Small amount of LEFT frontal convexity extra-axial blood products. Large frontal scalp hematoma without skull fracture. Acute nasal bone fractures.  10/06/16 CT SHOULDER RIGHT WO CONTRAST - 1. Neer category 1 part fracture of the proximal right humerus. Confirmation of fractures involving the surgical neck with slight impaction as well as sagittal fracture  undermining the greater tuberosity. Neither fracture fragment is angulated greater than 45 degrees nor displaced more than 1 cm. 2. AC and glenohumeral joint osteoarthritis. 3. Supraspinous calcific tendinopathy.  Procedures none  Hospital Course:  83 year-old female with a medical history significant for HTN and thyroid disease who presented to [facility] with headache and nosebleed after a fall.  Reports tripping on a curb and falling forward onto her face and right arm. No LOC. Patient was not on any blood thinning medications. Workup showed above nasal fracture, right humeral neck fracture, and subdural hematoma (imaging above) and the patient was admitted for observation, pain control, and neurosurgery/ortho/ENT consults.   Dr. Redmond Baseman evaluated the patients nasal fracture and determined that it may require closed reduction after edema decreased and recommended outpatient follow up in one week. Dr. Saintclair Halsted determined that that patients subdural hematoma was non-operative and stable after repeat head CT on 1/5. Orthopedic surgery evaluated the patients humeral fracture and were going to attempt non-operative management with close follow up in their office in 5 days.   On hospital day #2, the patients pain was controlled, she was voiding well, tolerating diet, working with therapies, vital signs stable, nd felt stable for discharge home. Patient will follow up with Dr. Marcelino Scot, Dr. Redmond Baseman, and Dr. Saintclair Halsted and will receive home health PT/OT.   Allergies as of 10/07/2016      Reactions   Codeine Nausea And Vomiting      Medication List    STOP taking these medications   fluticasone 50 MCG/ACT nasal spray Commonly known as:  FLONASE     TAKE these medications   acetaminophen 325 MG tablet Commonly known as:  TYLENOL Take 2 tablets (650 mg total) by mouth every 6 (six) hours as needed for mild pain.   aspirin EC 81 MG tablet  Take 81 mg by mouth daily.   bimatoprost 0.03 % ophthalmic  solution Commonly known as:  LUMIGAN Place 1 drop into both eyes at bedtime.   dorzolamide-timolol 22.3-6.8 MG/ML ophthalmic solution Commonly known as:  COSOPT Place 1 drop into the right eye 2 (two) times daily.   levothyroxine 100 MCG tablet Commonly known as:  SYNTHROID, LEVOTHROID Take 100 mcg by mouth daily before breakfast.   lisinopril 20 MG tablet Commonly known as:  PRINIVIL,ZESTRIL Take 20 mg by mouth daily.   ondansetron 4 MG tablet Commonly known as:  ZOFRAN Take 1 tablet (4 mg total) by mouth every 6 (six) hours as needed for nausea.            Durable Medical Equipment        Start     Ordered   10/07/16 0000  For home use only DME 3 n 1     10/07/16 1023       Follow-up Information    BATES, DWIGHT, MD. Schedule an appointment as soon as possible for a visit on 10/11/2016.   Specialty:  Otolaryngology Contact information: [address] [place] [address] [phone]        Rozanna Box, MD. Schedule an appointment as soon as possible for a visit on 10/11/2016.   Specialty:  Orthopedic Surgery Contact information: [place] [address] [phone]        [place]. Call.   Why:  as needed. Contact information: [place] [phone] [place], MD. Schedule an appointment as soon as possible for a visit in 4 week(s).   Specialty:  Neurosurgery Why:  for follow up from your recent hospitalization.  Contact information: [address] [place] [address] [place] Follow up.   Why:  home health physical and occupational therapy and speech therapy, and 3n1 Contact information: [place] [address] [phone]           Signed: Obie Dredge, [facility] 10/24/2016, 2:33 PM Pager:  [phone] Consults: [phone] Mon-Fri 7:00 am-4:30 pm Sat-Sun 7:00 am-11:30 am

## 2016-10-25 DIAGNOSIS — I10 Essential (primary) hypertension: Secondary | ICD-10-CM | POA: Diagnosis not present

## 2016-10-25 DIAGNOSIS — W010XXD Fall on same level from slipping, tripping and stumbling without subsequent striking against object, subsequent encounter: Secondary | ICD-10-CM | POA: Diagnosis not present

## 2016-10-25 DIAGNOSIS — S065X0D Traumatic subdural hemorrhage without loss of consciousness, subsequent encounter: Secondary | ICD-10-CM | POA: Diagnosis not present

## 2016-10-25 DIAGNOSIS — S42201D Unspecified fracture of upper end of right humerus, subsequent encounter for fracture with routine healing: Secondary | ICD-10-CM | POA: Diagnosis not present

## 2016-10-25 DIAGNOSIS — S42231D 3-part fracture of surgical neck of right humerus, subsequent encounter for fracture with routine healing: Secondary | ICD-10-CM | POA: Diagnosis not present

## 2016-10-25 DIAGNOSIS — S022XXD Fracture of nasal bones, subsequent encounter for fracture with routine healing: Secondary | ICD-10-CM | POA: Diagnosis not present

## 2016-10-26 DIAGNOSIS — S022XXD Fracture of nasal bones, subsequent encounter for fracture with routine healing: Secondary | ICD-10-CM | POA: Diagnosis not present

## 2016-10-26 DIAGNOSIS — I10 Essential (primary) hypertension: Secondary | ICD-10-CM | POA: Diagnosis not present

## 2016-10-26 DIAGNOSIS — S42201D Unspecified fracture of upper end of right humerus, subsequent encounter for fracture with routine healing: Secondary | ICD-10-CM | POA: Diagnosis not present

## 2016-10-26 DIAGNOSIS — W010XXD Fall on same level from slipping, tripping and stumbling without subsequent striking against object, subsequent encounter: Secondary | ICD-10-CM | POA: Diagnosis not present

## 2016-10-26 DIAGNOSIS — S065X0D Traumatic subdural hemorrhage without loss of consciousness, subsequent encounter: Secondary | ICD-10-CM | POA: Diagnosis not present

## 2016-10-27 DIAGNOSIS — S022XXD Fracture of nasal bones, subsequent encounter for fracture with routine healing: Secondary | ICD-10-CM | POA: Diagnosis not present

## 2016-10-27 DIAGNOSIS — S065X0D Traumatic subdural hemorrhage without loss of consciousness, subsequent encounter: Secondary | ICD-10-CM | POA: Diagnosis not present

## 2016-10-27 DIAGNOSIS — S42201D Unspecified fracture of upper end of right humerus, subsequent encounter for fracture with routine healing: Secondary | ICD-10-CM | POA: Diagnosis not present

## 2016-10-27 DIAGNOSIS — W010XXD Fall on same level from slipping, tripping and stumbling without subsequent striking against object, subsequent encounter: Secondary | ICD-10-CM | POA: Diagnosis not present

## 2016-10-27 DIAGNOSIS — I10 Essential (primary) hypertension: Secondary | ICD-10-CM | POA: Diagnosis not present

## 2016-10-30 DIAGNOSIS — S022XXD Fracture of nasal bones, subsequent encounter for fracture with routine healing: Secondary | ICD-10-CM | POA: Diagnosis not present

## 2016-10-30 DIAGNOSIS — S42201D Unspecified fracture of upper end of right humerus, subsequent encounter for fracture with routine healing: Secondary | ICD-10-CM | POA: Diagnosis not present

## 2016-10-30 DIAGNOSIS — I10 Essential (primary) hypertension: Secondary | ICD-10-CM | POA: Diagnosis not present

## 2016-10-30 DIAGNOSIS — W010XXD Fall on same level from slipping, tripping and stumbling without subsequent striking against object, subsequent encounter: Secondary | ICD-10-CM | POA: Diagnosis not present

## 2016-10-30 DIAGNOSIS — S065X0D Traumatic subdural hemorrhage without loss of consciousness, subsequent encounter: Secondary | ICD-10-CM | POA: Diagnosis not present

## 2016-10-31 DIAGNOSIS — S065X0D Traumatic subdural hemorrhage without loss of consciousness, subsequent encounter: Secondary | ICD-10-CM | POA: Diagnosis not present

## 2016-10-31 DIAGNOSIS — W010XXD Fall on same level from slipping, tripping and stumbling without subsequent striking against object, subsequent encounter: Secondary | ICD-10-CM | POA: Diagnosis not present

## 2016-10-31 DIAGNOSIS — I10 Essential (primary) hypertension: Secondary | ICD-10-CM | POA: Diagnosis not present

## 2016-10-31 DIAGNOSIS — S022XXD Fracture of nasal bones, subsequent encounter for fracture with routine healing: Secondary | ICD-10-CM | POA: Diagnosis not present

## 2016-10-31 DIAGNOSIS — S42201D Unspecified fracture of upper end of right humerus, subsequent encounter for fracture with routine healing: Secondary | ICD-10-CM | POA: Diagnosis not present

## 2016-11-03 DIAGNOSIS — S022XXD Fracture of nasal bones, subsequent encounter for fracture with routine healing: Secondary | ICD-10-CM | POA: Diagnosis not present

## 2016-11-03 DIAGNOSIS — W010XXD Fall on same level from slipping, tripping and stumbling without subsequent striking against object, subsequent encounter: Secondary | ICD-10-CM | POA: Diagnosis not present

## 2016-11-03 DIAGNOSIS — I10 Essential (primary) hypertension: Secondary | ICD-10-CM | POA: Diagnosis not present

## 2016-11-03 DIAGNOSIS — S42201D Unspecified fracture of upper end of right humerus, subsequent encounter for fracture with routine healing: Secondary | ICD-10-CM | POA: Diagnosis not present

## 2016-11-03 DIAGNOSIS — S065X0D Traumatic subdural hemorrhage without loss of consciousness, subsequent encounter: Secondary | ICD-10-CM | POA: Diagnosis not present

## 2016-11-06 ENCOUNTER — Other Ambulatory Visit (HOSPITAL_COMMUNITY): Payer: Self-pay | Admitting: Neurosurgery

## 2016-11-06 DIAGNOSIS — S42201D Unspecified fracture of upper end of right humerus, subsequent encounter for fracture with routine healing: Secondary | ICD-10-CM | POA: Diagnosis not present

## 2016-11-06 DIAGNOSIS — S022XXD Fracture of nasal bones, subsequent encounter for fracture with routine healing: Secondary | ICD-10-CM | POA: Diagnosis not present

## 2016-11-06 DIAGNOSIS — S065X0D Traumatic subdural hemorrhage without loss of consciousness, subsequent encounter: Secondary | ICD-10-CM | POA: Diagnosis not present

## 2016-11-06 DIAGNOSIS — W010XXD Fall on same level from slipping, tripping and stumbling without subsequent striking against object, subsequent encounter: Secondary | ICD-10-CM | POA: Diagnosis not present

## 2016-11-06 DIAGNOSIS — I6201 Nontraumatic acute subdural hemorrhage: Secondary | ICD-10-CM | POA: Diagnosis not present

## 2016-11-06 DIAGNOSIS — M545 Low back pain: Secondary | ICD-10-CM | POA: Diagnosis not present

## 2016-11-06 DIAGNOSIS — I10 Essential (primary) hypertension: Secondary | ICD-10-CM | POA: Diagnosis not present

## 2016-11-06 DIAGNOSIS — R609 Edema, unspecified: Secondary | ICD-10-CM

## 2016-11-06 DIAGNOSIS — S32031A Stable burst fracture of third lumbar vertebra, initial encounter for closed fracture: Secondary | ICD-10-CM | POA: Diagnosis not present

## 2016-11-06 DIAGNOSIS — S32030A Wedge compression fracture of third lumbar vertebra, initial encounter for closed fracture: Secondary | ICD-10-CM | POA: Diagnosis not present

## 2016-11-07 ENCOUNTER — Ambulatory Visit (HOSPITAL_COMMUNITY)
Admission: RE | Admit: 2016-11-07 | Discharge: 2016-11-07 | Disposition: A | Discharge status: Home / Self Care | Payer: Medicare HMO | Source: Ambulatory Visit | Attending: Family Medicine | Admitting: Family Medicine

## 2016-11-07 ENCOUNTER — Other Ambulatory Visit: Payer: Self-pay | Admitting: Neurosurgery

## 2016-11-07 DIAGNOSIS — S32008A Other fracture of unspecified lumbar vertebra, initial encounter for closed fracture: Secondary | ICD-10-CM

## 2016-11-07 DIAGNOSIS — M7989 Other specified soft tissue disorders: Secondary | ICD-10-CM | POA: Insufficient documentation

## 2016-11-07 DIAGNOSIS — R609 Edema, unspecified: Secondary | ICD-10-CM | POA: Diagnosis not present

## 2016-11-07 NOTE — Progress Notes (Signed)
VASCULAR LAB PRELIMINARY  PRELIMINARY  PRELIMINARY  PRELIMINARY  Bilateral lower extremity venous duplex completed.    Preliminary report:  Bilateral:  No evidence of DVT, superficial thrombosis, or Baker's Cyst.   Jaydrian Corpening, RVS 11/07/2016, 3:52 PM

## 2016-11-08 ENCOUNTER — Inpatient Hospital Stay: Admission: RE | Admit: 2016-11-08 | Payer: Commercial Managed Care - HMO | Source: Ambulatory Visit

## 2016-11-08 ENCOUNTER — Ambulatory Visit
Admission: RE | Admit: 2016-11-08 | Discharge: 2016-11-08 | Disposition: A | Discharge status: Home / Self Care | Payer: Medicare HMO | Source: Ambulatory Visit | Attending: Neurosurgery | Admitting: Neurosurgery

## 2016-11-08 DIAGNOSIS — S32008A Other fracture of unspecified lumbar vertebra, initial encounter for closed fracture: Secondary | ICD-10-CM

## 2016-11-08 DIAGNOSIS — S065X0D Traumatic subdural hemorrhage without loss of consciousness, subsequent encounter: Secondary | ICD-10-CM | POA: Diagnosis not present

## 2016-11-08 DIAGNOSIS — S42201D Unspecified fracture of upper end of right humerus, subsequent encounter for fracture with routine healing: Secondary | ICD-10-CM | POA: Diagnosis not present

## 2016-11-08 DIAGNOSIS — W010XXD Fall on same level from slipping, tripping and stumbling without subsequent striking against object, subsequent encounter: Secondary | ICD-10-CM | POA: Diagnosis not present

## 2016-11-08 DIAGNOSIS — S32020A Wedge compression fracture of second lumbar vertebra, initial encounter for closed fracture: Secondary | ICD-10-CM | POA: Diagnosis not present

## 2016-11-08 DIAGNOSIS — S022XXD Fracture of nasal bones, subsequent encounter for fracture with routine healing: Secondary | ICD-10-CM | POA: Diagnosis not present

## 2016-11-08 DIAGNOSIS — I10 Essential (primary) hypertension: Secondary | ICD-10-CM | POA: Diagnosis not present

## 2016-11-09 DIAGNOSIS — S32031A Stable burst fracture of third lumbar vertebra, initial encounter for closed fracture: Secondary | ICD-10-CM | POA: Diagnosis not present

## 2016-11-13 DIAGNOSIS — S065X0D Traumatic subdural hemorrhage without loss of consciousness, subsequent encounter: Secondary | ICD-10-CM | POA: Diagnosis not present

## 2016-11-13 DIAGNOSIS — I10 Essential (primary) hypertension: Secondary | ICD-10-CM | POA: Diagnosis not present

## 2016-11-13 DIAGNOSIS — W010XXD Fall on same level from slipping, tripping and stumbling without subsequent striking against object, subsequent encounter: Secondary | ICD-10-CM | POA: Diagnosis not present

## 2016-11-13 DIAGNOSIS — S022XXD Fracture of nasal bones, subsequent encounter for fracture with routine healing: Secondary | ICD-10-CM | POA: Diagnosis not present

## 2016-11-13 DIAGNOSIS — S42201D Unspecified fracture of upper end of right humerus, subsequent encounter for fracture with routine healing: Secondary | ICD-10-CM | POA: Diagnosis not present

## 2016-11-15 DIAGNOSIS — S42231D 3-part fracture of surgical neck of right humerus, subsequent encounter for fracture with routine healing: Secondary | ICD-10-CM | POA: Diagnosis not present

## 2016-11-17 DIAGNOSIS — S022XXD Fracture of nasal bones, subsequent encounter for fracture with routine healing: Secondary | ICD-10-CM | POA: Diagnosis not present

## 2016-11-17 DIAGNOSIS — S065X0D Traumatic subdural hemorrhage without loss of consciousness, subsequent encounter: Secondary | ICD-10-CM | POA: Diagnosis not present

## 2016-11-17 DIAGNOSIS — I10 Essential (primary) hypertension: Secondary | ICD-10-CM | POA: Diagnosis not present

## 2016-11-17 DIAGNOSIS — W010XXD Fall on same level from slipping, tripping and stumbling without subsequent striking against object, subsequent encounter: Secondary | ICD-10-CM | POA: Diagnosis not present

## 2016-11-17 DIAGNOSIS — S42201D Unspecified fracture of upper end of right humerus, subsequent encounter for fracture with routine healing: Secondary | ICD-10-CM | POA: Diagnosis not present

## 2016-11-20 DIAGNOSIS — S32031A Stable burst fracture of third lumbar vertebra, initial encounter for closed fracture: Secondary | ICD-10-CM | POA: Diagnosis not present

## 2016-11-20 DIAGNOSIS — S42201D Unspecified fracture of upper end of right humerus, subsequent encounter for fracture with routine healing: Secondary | ICD-10-CM | POA: Diagnosis not present

## 2016-11-20 DIAGNOSIS — S065X0D Traumatic subdural hemorrhage without loss of consciousness, subsequent encounter: Secondary | ICD-10-CM | POA: Diagnosis not present

## 2016-11-20 DIAGNOSIS — I10 Essential (primary) hypertension: Secondary | ICD-10-CM | POA: Diagnosis not present

## 2016-11-20 DIAGNOSIS — S022XXD Fracture of nasal bones, subsequent encounter for fracture with routine healing: Secondary | ICD-10-CM | POA: Diagnosis not present

## 2016-11-20 DIAGNOSIS — W010XXD Fall on same level from slipping, tripping and stumbling without subsequent striking against object, subsequent encounter: Secondary | ICD-10-CM | POA: Diagnosis not present

## 2016-11-23 DIAGNOSIS — I10 Essential (primary) hypertension: Secondary | ICD-10-CM | POA: Diagnosis not present

## 2016-11-23 DIAGNOSIS — S022XXD Fracture of nasal bones, subsequent encounter for fracture with routine healing: Secondary | ICD-10-CM | POA: Diagnosis not present

## 2016-11-23 DIAGNOSIS — W010XXD Fall on same level from slipping, tripping and stumbling without subsequent striking against object, subsequent encounter: Secondary | ICD-10-CM | POA: Diagnosis not present

## 2016-11-23 DIAGNOSIS — S065X0D Traumatic subdural hemorrhage without loss of consciousness, subsequent encounter: Secondary | ICD-10-CM | POA: Diagnosis not present

## 2016-11-23 DIAGNOSIS — S42201D Unspecified fracture of upper end of right humerus, subsequent encounter for fracture with routine healing: Secondary | ICD-10-CM | POA: Diagnosis not present

## 2016-11-27 DIAGNOSIS — S065X0D Traumatic subdural hemorrhage without loss of consciousness, subsequent encounter: Secondary | ICD-10-CM | POA: Diagnosis not present

## 2016-11-27 DIAGNOSIS — S42201D Unspecified fracture of upper end of right humerus, subsequent encounter for fracture with routine healing: Secondary | ICD-10-CM | POA: Diagnosis not present

## 2016-11-27 DIAGNOSIS — I10 Essential (primary) hypertension: Secondary | ICD-10-CM | POA: Diagnosis not present

## 2016-11-27 DIAGNOSIS — W010XXD Fall on same level from slipping, tripping and stumbling without subsequent striking against object, subsequent encounter: Secondary | ICD-10-CM | POA: Diagnosis not present

## 2016-11-27 DIAGNOSIS — S022XXD Fracture of nasal bones, subsequent encounter for fracture with routine healing: Secondary | ICD-10-CM | POA: Diagnosis not present

## 2016-11-29 DIAGNOSIS — R296 Repeated falls: Secondary | ICD-10-CM | POA: Diagnosis not present

## 2016-11-29 DIAGNOSIS — R269 Unspecified abnormalities of gait and mobility: Secondary | ICD-10-CM | POA: Diagnosis not present

## 2016-11-29 DIAGNOSIS — S42201A Unspecified fracture of upper end of right humerus, initial encounter for closed fracture: Secondary | ICD-10-CM | POA: Diagnosis not present

## 2016-11-29 DIAGNOSIS — S065X0D Traumatic subdural hemorrhage without loss of consciousness, subsequent encounter: Secondary | ICD-10-CM | POA: Diagnosis not present

## 2016-11-30 DIAGNOSIS — S42201D Unspecified fracture of upper end of right humerus, subsequent encounter for fracture with routine healing: Secondary | ICD-10-CM | POA: Diagnosis not present

## 2016-11-30 DIAGNOSIS — S022XXD Fracture of nasal bones, subsequent encounter for fracture with routine healing: Secondary | ICD-10-CM | POA: Diagnosis not present

## 2016-11-30 DIAGNOSIS — S065X0D Traumatic subdural hemorrhage without loss of consciousness, subsequent encounter: Secondary | ICD-10-CM | POA: Diagnosis not present

## 2016-11-30 DIAGNOSIS — I10 Essential (primary) hypertension: Secondary | ICD-10-CM | POA: Diagnosis not present

## 2016-11-30 DIAGNOSIS — W010XXD Fall on same level from slipping, tripping and stumbling without subsequent striking against object, subsequent encounter: Secondary | ICD-10-CM | POA: Diagnosis not present

## 2016-12-05 DIAGNOSIS — S42201D Unspecified fracture of upper end of right humerus, subsequent encounter for fracture with routine healing: Secondary | ICD-10-CM | POA: Diagnosis not present

## 2016-12-05 DIAGNOSIS — I10 Essential (primary) hypertension: Secondary | ICD-10-CM | POA: Diagnosis not present

## 2016-12-05 DIAGNOSIS — S065X0D Traumatic subdural hemorrhage without loss of consciousness, subsequent encounter: Secondary | ICD-10-CM | POA: Diagnosis not present

## 2016-12-05 DIAGNOSIS — W010XXD Fall on same level from slipping, tripping and stumbling without subsequent striking against object, subsequent encounter: Secondary | ICD-10-CM | POA: Diagnosis not present

## 2016-12-05 DIAGNOSIS — S022XXD Fracture of nasal bones, subsequent encounter for fracture with routine healing: Secondary | ICD-10-CM | POA: Diagnosis not present

## 2016-12-07 DIAGNOSIS — S022XXD Fracture of nasal bones, subsequent encounter for fracture with routine healing: Secondary | ICD-10-CM | POA: Diagnosis not present

## 2016-12-07 DIAGNOSIS — W010XXD Fall on same level from slipping, tripping and stumbling without subsequent striking against object, subsequent encounter: Secondary | ICD-10-CM | POA: Diagnosis not present

## 2016-12-07 DIAGNOSIS — I10 Essential (primary) hypertension: Secondary | ICD-10-CM | POA: Diagnosis not present

## 2016-12-07 DIAGNOSIS — S065X0D Traumatic subdural hemorrhage without loss of consciousness, subsequent encounter: Secondary | ICD-10-CM | POA: Diagnosis not present

## 2016-12-07 DIAGNOSIS — S42201D Unspecified fracture of upper end of right humerus, subsequent encounter for fracture with routine healing: Secondary | ICD-10-CM | POA: Diagnosis not present

## 2016-12-18 DIAGNOSIS — S32031A Stable burst fracture of third lumbar vertebra, initial encounter for closed fracture: Secondary | ICD-10-CM | POA: Diagnosis not present

## 2016-12-20 ENCOUNTER — Encounter: Payer: Self-pay | Admitting: Podiatry

## 2016-12-20 ENCOUNTER — Ambulatory Visit (INDEPENDENT_AMBULATORY_CARE_PROVIDER_SITE_OTHER): Payer: Medicare HMO | Admitting: Podiatry

## 2016-12-20 DIAGNOSIS — B351 Tinea unguium: Secondary | ICD-10-CM | POA: Diagnosis not present

## 2016-12-20 NOTE — Progress Notes (Signed)
Subjective:     Patient ID: Linda Copeland, female   DOB: 16-Mar-1931, 83 y.o.   MRN: [id number]  HPI patient presents with thick nails 1-5 both feet that are incurvated and moderately tender when pressed and states that she cannot cut them herself   Review of Systems  All other systems reviewed and are negative.      Objective:   Physical Exam  Constitutional: She is oriented to person, place, and time.  Cardiovascular: Intact distal pulses.   Musculoskeletal: Normal range of motion.  Neurological: She is oriented to person, place, and time.  Skin: Skin is warm.  Nursing note and vitals reviewed. Neurovascular status found to be intact muscle strength was adequate range of motion within normal limits with patient found to have thick nailbeds 1-5 both feet that are nonpainful but impossible for her to cut     Assessment:     Chronic mycotic nail infection 1-5 both feet with no current pain    Plan:     H&P performed debridement nailbeds 1-5 both feet with no iatrogenic bleeding smooth the nails and reappoint for routine care 3 months or earlier if needed

## 2016-12-27 DIAGNOSIS — S42231D 3-part fracture of surgical neck of right humerus, subsequent encounter for fracture with routine healing: Secondary | ICD-10-CM | POA: Diagnosis not present

## 2017-01-08 DIAGNOSIS — E119 Type 2 diabetes mellitus without complications: Secondary | ICD-10-CM | POA: Diagnosis not present

## 2017-01-08 DIAGNOSIS — H401131 Primary open-angle glaucoma, bilateral, mild stage: Secondary | ICD-10-CM | POA: Diagnosis not present

## 2017-01-22 DIAGNOSIS — E119 Type 2 diabetes mellitus without complications: Secondary | ICD-10-CM | POA: Diagnosis not present

## 2017-01-22 DIAGNOSIS — E039 Hypothyroidism, unspecified: Secondary | ICD-10-CM | POA: Diagnosis not present

## 2017-01-22 DIAGNOSIS — J309 Allergic rhinitis, unspecified: Secondary | ICD-10-CM | POA: Diagnosis not present

## 2017-01-22 DIAGNOSIS — D692 Other nonthrombocytopenic purpura: Secondary | ICD-10-CM | POA: Diagnosis not present

## 2017-01-22 DIAGNOSIS — R350 Frequency of micturition: Secondary | ICD-10-CM | POA: Diagnosis not present

## 2017-01-22 DIAGNOSIS — I10 Essential (primary) hypertension: Secondary | ICD-10-CM | POA: Diagnosis not present

## 2017-01-22 DIAGNOSIS — H409 Unspecified glaucoma: Secondary | ICD-10-CM | POA: Diagnosis not present

## 2017-02-08 DIAGNOSIS — H401111 Primary open-angle glaucoma, right eye, mild stage: Secondary | ICD-10-CM | POA: Diagnosis not present

## 2017-03-22 ENCOUNTER — Ambulatory Visit (INDEPENDENT_AMBULATORY_CARE_PROVIDER_SITE_OTHER): Payer: Medicare HMO | Admitting: Podiatry

## 2017-03-22 DIAGNOSIS — M79676 Pain in unspecified toe(s): Secondary | ICD-10-CM | POA: Diagnosis not present

## 2017-03-22 DIAGNOSIS — B351 Tinea unguium: Secondary | ICD-10-CM

## 2017-03-22 NOTE — Progress Notes (Signed)
Subjective:    Patient ID: Linda Copeland, female   DOB: 83 y.o.   MRN: [id number]   HPI patient presents with nail disease 1-5 both feet that are thick incurvated moderately sore    ROS      Objective:  Physical Exam neurovascular status intact negative Homans sign was noted with incurvated thickened nailbeds 1-5 both feet     Assessment:    Mycotic nail infection with pain 1-5 both feet     Plan:    Debris painful nailbeds 1-5 both feet with no iatrogenic bleeding noted

## 2017-06-08 DIAGNOSIS — H401111 Primary open-angle glaucoma, right eye, mild stage: Secondary | ICD-10-CM | POA: Diagnosis not present

## 2017-06-20 DIAGNOSIS — N750 Cyst of Bartholin's gland: Secondary | ICD-10-CM | POA: Diagnosis not present

## 2017-06-25 ENCOUNTER — Encounter: Payer: Self-pay | Admitting: Podiatry

## 2017-06-25 ENCOUNTER — Ambulatory Visit (INDEPENDENT_AMBULATORY_CARE_PROVIDER_SITE_OTHER): Payer: Medicare HMO | Admitting: Podiatry

## 2017-06-25 DIAGNOSIS — B351 Tinea unguium: Secondary | ICD-10-CM

## 2017-06-26 NOTE — Progress Notes (Signed)
Subjective:    Patient ID: Linda Copeland, female   DOB: 83 y.o.   MRN: [id number]   HPI patient presents stating her nails have thickened there incurvated and she cannot cut them and they're sore with shoe gear    ROS      Objective:  Physical Exam neurovascular status intact with patient noted to have yellow brittle nailbeds 1-5 both feet that are painful     Assessment:    Mycotic nail infections with pain 1-5 both feet     Plan:    Debris painful nailbeds 1-5 both feet with no iatrogenic bleeding noted

## 2017-07-25 DIAGNOSIS — H409 Unspecified glaucoma: Secondary | ICD-10-CM | POA: Diagnosis not present

## 2017-07-25 DIAGNOSIS — E039 Hypothyroidism, unspecified: Secondary | ICD-10-CM | POA: Diagnosis not present

## 2017-07-25 DIAGNOSIS — D692 Other nonthrombocytopenic purpura: Secondary | ICD-10-CM | POA: Diagnosis not present

## 2017-07-25 DIAGNOSIS — E119 Type 2 diabetes mellitus without complications: Secondary | ICD-10-CM | POA: Diagnosis not present

## 2017-07-25 DIAGNOSIS — I10 Essential (primary) hypertension: Secondary | ICD-10-CM | POA: Diagnosis not present

## 2017-07-25 DIAGNOSIS — J309 Allergic rhinitis, unspecified: Secondary | ICD-10-CM | POA: Diagnosis not present

## 2017-07-25 DIAGNOSIS — R350 Frequency of micturition: Secondary | ICD-10-CM | POA: Diagnosis not present

## 2017-08-20 DIAGNOSIS — M25511 Pain in right shoulder: Secondary | ICD-10-CM | POA: Diagnosis not present

## 2017-08-20 DIAGNOSIS — G5621 Lesion of ulnar nerve, right upper limb: Secondary | ICD-10-CM | POA: Diagnosis not present

## 2017-08-24 DIAGNOSIS — L02214 Cutaneous abscess of groin: Secondary | ICD-10-CM | POA: Diagnosis not present

## 2017-10-03 DIAGNOSIS — W19XXXA Unspecified fall, initial encounter: Secondary | ICD-10-CM | POA: Diagnosis not present

## 2017-10-03 DIAGNOSIS — S8001XA Contusion of right knee, initial encounter: Secondary | ICD-10-CM | POA: Diagnosis not present

## 2017-10-03 DIAGNOSIS — S51011A Laceration without foreign body of right elbow, initial encounter: Secondary | ICD-10-CM | POA: Diagnosis not present

## 2017-10-03 DIAGNOSIS — S51012A Laceration without foreign body of left elbow, initial encounter: Secondary | ICD-10-CM | POA: Diagnosis not present

## 2017-10-03 DIAGNOSIS — S0083XA Contusion of other part of head, initial encounter: Secondary | ICD-10-CM | POA: Diagnosis not present

## 2017-10-03 DIAGNOSIS — M25561 Pain in right knee: Secondary | ICD-10-CM | POA: Diagnosis not present

## 2017-10-03 DIAGNOSIS — S0081XA Abrasion of other part of head, initial encounter: Secondary | ICD-10-CM | POA: Diagnosis not present

## 2017-10-16 DIAGNOSIS — R208 Other disturbances of skin sensation: Secondary | ICD-10-CM | POA: Diagnosis not present

## 2017-10-17 ENCOUNTER — Other Ambulatory Visit: Payer: Self-pay | Admitting: *Deleted

## 2017-10-17 ENCOUNTER — Encounter: Payer: Self-pay | Admitting: Neurology

## 2017-10-17 DIAGNOSIS — R202 Paresthesia of skin: Principal | ICD-10-CM

## 2017-10-17 DIAGNOSIS — R200 Anesthesia of skin: Secondary | ICD-10-CM

## 2017-11-08 ENCOUNTER — Ambulatory Visit (INDEPENDENT_AMBULATORY_CARE_PROVIDER_SITE_OTHER): Payer: Medicare HMO | Admitting: Neurology

## 2017-11-08 DIAGNOSIS — R202 Paresthesia of skin: Secondary | ICD-10-CM

## 2017-11-08 DIAGNOSIS — G5601 Carpal tunnel syndrome, right upper limb: Secondary | ICD-10-CM

## 2017-11-08 DIAGNOSIS — R200 Anesthesia of skin: Secondary | ICD-10-CM

## 2017-11-08 NOTE — Procedures (Signed)
[facility]  [address], [place]  [place], [place] [address] Tel: [phone] Fax:  [phone] Test Date:  11/08/2017  Patient: Linda Copeland DOB: 1930/10/13 Physician: Narda Amber, DO  Sex: Female Height: 5\' 1"  Ref Phys: Gaynelle Arabian, MD  ID#: [id number] Temp: 36.3C Technician:    Patient Complaints: This is a 84 year old female referred for evaluation of right hand pain and numbness.  NCV & EMG Findings: Extensive electrodiagnostic testing of the right upper extremity shows:  1. Right median sensory response is absent. Right ulnar and radial sensory responses are within normal limits. 2. Right median motor response shows severely prolonged latency (5.2 ms) and reduced amplitude (1.5 mV).  Right ulnar motor response shows reduced amplitude, without conduction velocity slowing or latency prolongation; in the setting of normal needle electrode examination of the ulnar muscles, this finding may be normal for patient given her smaller muscle bulk. Right radial motor responses within normal limits. 3. Chronic motor axon loss changes are seen affecting the right abductor pollicis, biceps, and deltoid muscles, without accompanied active denervation.  Impression: 1. Right median neuropathy at or distal to the wrist, consistent with the clinical diagnosis of carpal tunnel syndrome. Overall, these findings are very severe in degree electrically. 2. Chronic C5 radiculopathy affecting the right upper extremity, moderate in degree electrically.   ___________________________ Narda Amber, DO    Nerve Conduction Studies Anti Sensory Summary Table   Site NR Peak (ms) Norm Peak (ms) P-T Amp (V) Norm P-T Amp  Right Median Anti Sensory (2nd Digit)  36.3C  Wrist NR  <3.8  >10  Right Radial Anti Sensory (Base 1st Digit)  36.3C  Wrist    2.4 <2.8 12.4 >10  Right Ulnar Anti Sensory (5th Digit)  36.3C  Wrist    2.3 <3.2 25.2 >5   Motor Summary Table   Site NR Onset (ms)  Norm Onset (ms) O-P Amp (mV) Norm O-P Amp Site1 Site2 Delta-0 (ms) Dist (cm) Vel (m/s) Norm Vel (m/s)  Right Median Motor (Abd Poll Brev)  36.3C  Wrist    5.2 <4.0 1.5 >5 Elbow Wrist 5.3 30.0 57 >50  Elbow    10.5  1.3         Right Radial Motor (Ext Ind Prop)  36.3C  7cm    0.9 <3.1 5.1 >5 Up Arm 7cm 1.3 7.0 54 >50  Up Arm    2.2  4.7  Spiral Groove Up Arm 2.0 12.0 60   Spiral Groove    4.2  4.5         Right Ulnar Motor (Abd Dig Minimi)  36.3C  Wrist    1.9 <3.1 5.6 >7 B Elbow Wrist 3.6 21.0 58 >50  B Elbow    5.5  5.2  A Elbow B Elbow 1.9 10.0 53 >50  A Elbow    7.4  5.1          EMG   Side Muscle Ins Act Fibs Psw Fasc Number Recrt Dur Dur. Amp Amp. Poly Poly. Comment  Right 1stDorInt Nml Nml Nml Nml Nml Nml Nml Nml Nml Nml Nml Nml N/A  Right Abd Poll Brev Nml Nml Nml Nml 2- Rapid Many 1+ Many 1+ Nml Nml N/A  Right Ext Indicis Nml Nml Nml Nml Nml Nml Nml Nml Nml Nml Nml Nml N/A  Right PronatorTeres Nml Nml Nml Nml Nml Nml Nml Nml Nml Nml Nml Nml N/A  Right Biceps Nml Nml Nml Nml 1- Rapid Few 1+  Few 1+ Nml Nml N/A  Right Triceps Nml Nml Nml Nml Nml Nml Nml Nml Nml Nml Nml Nml N/A  Right Deltoid Nml Nml Nml Nml 2- Rapid Some 1+ Some 1+ Some 1+ N/A      Waveforms:

## 2017-11-14 ENCOUNTER — Encounter: Payer: Self-pay | Admitting: Podiatry

## 2017-11-14 ENCOUNTER — Ambulatory Visit: Payer: Medicare HMO | Admitting: Podiatry

## 2017-11-14 DIAGNOSIS — M79675 Pain in left toe(s): Secondary | ICD-10-CM | POA: Diagnosis not present

## 2017-11-14 DIAGNOSIS — M79674 Pain in right toe(s): Secondary | ICD-10-CM

## 2017-11-14 DIAGNOSIS — B351 Tinea unguium: Secondary | ICD-10-CM | POA: Diagnosis not present

## 2017-11-14 NOTE — Progress Notes (Signed)
Subjective:   Patient ID: Linda Copeland, female   DOB: 84 y.o.   MRN: [id number]   HPI Patient presents with elongated incurvated nailbeds 1-5 both feet that do get tender and are hard for her to cut   ROS      Objective:  Physical Exam  Neurovascular status intact with thick yellow brittle nailbeds 1-5 both feet     Assessment:  Mycotic nail infection with pain 1-5 both feet     Plan:  Debride painful nailbeds 1-5 both feet with no iatrogenic bleeding noted

## 2017-11-21 DIAGNOSIS — M1811 Unilateral primary osteoarthritis of first carpometacarpal joint, right hand: Secondary | ICD-10-CM | POA: Diagnosis not present

## 2017-11-21 DIAGNOSIS — G5601 Carpal tunnel syndrome, right upper limb: Secondary | ICD-10-CM | POA: Diagnosis not present

## 2017-12-05 DIAGNOSIS — E119 Type 2 diabetes mellitus without complications: Secondary | ICD-10-CM | POA: Diagnosis not present

## 2017-12-05 DIAGNOSIS — H401111 Primary open-angle glaucoma, right eye, mild stage: Secondary | ICD-10-CM | POA: Diagnosis not present

## 2018-01-02 DIAGNOSIS — G5601 Carpal tunnel syndrome, right upper limb: Secondary | ICD-10-CM | POA: Diagnosis not present

## 2018-01-02 DIAGNOSIS — M1811 Unilateral primary osteoarthritis of first carpometacarpal joint, right hand: Secondary | ICD-10-CM | POA: Diagnosis not present

## 2018-01-14 ENCOUNTER — Encounter (HOSPITAL_BASED_OUTPATIENT_CLINIC_OR_DEPARTMENT_OTHER): Payer: Self-pay

## 2018-01-15 ENCOUNTER — Other Ambulatory Visit: Payer: Self-pay | Admitting: Orthopedic Surgery

## 2018-01-16 ENCOUNTER — Other Ambulatory Visit: Payer: Self-pay

## 2018-01-16 ENCOUNTER — Encounter (HOSPITAL_BASED_OUTPATIENT_CLINIC_OR_DEPARTMENT_OTHER): Payer: Self-pay

## 2018-01-16 NOTE — Progress Notes (Signed)
Spoke with:  Linda Copeland and Linda Copeland (son) NPO:  After Midnight, no gum, candy, or mints   Arrival time:  0845AM Labs:  Istat8, EKG AM medications:  Levothyroxine, Eye drops Pre op orders:  Yes Ride home:  Linda Copeland (son) [phone]  Hard of hearing

## 2018-01-22 ENCOUNTER — Ambulatory Visit (HOSPITAL_BASED_OUTPATIENT_CLINIC_OR_DEPARTMENT_OTHER): Payer: Medicare HMO | Admitting: Anesthesiology

## 2018-01-22 ENCOUNTER — Encounter (HOSPITAL_BASED_OUTPATIENT_CLINIC_OR_DEPARTMENT_OTHER)
Admission: RE | Disposition: A | Discharge status: Home / Self Care | Payer: Self-pay | Source: Ambulatory Visit | Attending: Orthopedic Surgery

## 2018-01-22 ENCOUNTER — Ambulatory Visit (HOSPITAL_BASED_OUTPATIENT_CLINIC_OR_DEPARTMENT_OTHER)
Admission: RE | Admit: 2018-01-22 | Discharge: 2018-01-22 | Disposition: A | Discharge status: Home / Self Care | Payer: Medicare HMO | Source: Ambulatory Visit | Attending: Orthopedic Surgery | Admitting: Orthopedic Surgery

## 2018-01-22 ENCOUNTER — Encounter (HOSPITAL_BASED_OUTPATIENT_CLINIC_OR_DEPARTMENT_OTHER): Payer: Self-pay

## 2018-01-22 DIAGNOSIS — Z79899 Other long term (current) drug therapy: Secondary | ICD-10-CM | POA: Diagnosis not present

## 2018-01-22 DIAGNOSIS — I10 Essential (primary) hypertension: Secondary | ICD-10-CM | POA: Diagnosis not present

## 2018-01-22 DIAGNOSIS — E039 Hypothyroidism, unspecified: Secondary | ICD-10-CM | POA: Insufficient documentation

## 2018-01-22 DIAGNOSIS — I6200 Nontraumatic subdural hemorrhage, unspecified: Secondary | ICD-10-CM | POA: Diagnosis not present

## 2018-01-22 DIAGNOSIS — G5601 Carpal tunnel syndrome, right upper limb: Secondary | ICD-10-CM | POA: Insufficient documentation

## 2018-01-22 HISTORY — DX: Other specified disorders of bone density and structure, unspecified site: M85.80

## 2018-01-22 HISTORY — DX: Personal history of (healed) traumatic fracture: Z87.81

## 2018-01-22 HISTORY — DX: Hypothyroidism, unspecified: E03.9

## 2018-01-22 HISTORY — DX: Other allergic rhinitis: J30.89

## 2018-01-22 HISTORY — PX: CARPAL TUNNEL RELEASE: SHX101

## 2018-01-22 HISTORY — DX: Frequency of micturition: R35.0

## 2018-01-22 HISTORY — DX: Contusion of scalp, initial encounter: S00.03XA

## 2018-01-22 HISTORY — DX: Wedge compression fracture of unspecified lumbar vertebra, initial encounter for closed fracture: S32.000A

## 2018-01-22 HISTORY — DX: Carpal tunnel syndrome, right upper limb: G56.01

## 2018-01-22 HISTORY — DX: Fracture of nasal bones, initial encounter for closed fracture: S02.2XXA

## 2018-01-22 HISTORY — DX: Unspecified osteoarthritis, unspecified site: M19.90

## 2018-01-22 HISTORY — DX: Spinal stenosis, lumbar region without neurogenic claudication: M48.061

## 2018-01-22 HISTORY — DX: Unspecified hearing loss, unspecified ear: H91.90

## 2018-01-22 HISTORY — DX: Personal history of other diseases of the circulatory system: Z86.79

## 2018-01-22 HISTORY — DX: Personal history of other diseases of the respiratory system: Z87.09

## 2018-01-22 HISTORY — DX: Radiculopathy, cervical region: M54.12

## 2018-01-22 LAB — POCT I-STAT, CHEM 8
BUN: 8 mg/dL (ref 6–20)
CALCIUM ION: 1.27 mmol/L (ref 1.15–1.40)
Chloride: 104 mmol/L (ref 101–111)
Creatinine, Ser: 0.4 mg/dL — ABNORMAL LOW (ref 0.44–1.00)
Glucose, Bld: 99 mg/dL (ref 65–99)
HCT: 40 % (ref 36.0–46.0)
HEMOGLOBIN: 13.6 g/dL (ref 12.0–15.0)
Potassium: 4.1 mmol/L (ref 3.5–5.1)
SODIUM: 140 mmol/L (ref 135–145)
TCO2: 26 mmol/L (ref 22–32)

## 2018-01-22 SURGERY — CARPAL TUNNEL RELEASE
Anesthesia: Monitor Anesthesia Care | Laterality: Right

## 2018-01-22 MED ORDER — CHLORHEXIDINE GLUCONATE 4 % EX LIQD
60.0000 mL | Freq: Once | CUTANEOUS | Status: DC
  Filled 2018-01-22: qty 118

## 2018-01-22 MED ORDER — FENTANYL CITRATE (PF) 100 MCG/2ML IJ SOLN
INTRAMUSCULAR | Status: AC
  Filled 2018-01-22: qty 2

## 2018-01-22 MED ORDER — LABETALOL HCL 5 MG/ML IV SOLN
INTRAVENOUS | Status: DC | PRN
  Administered 2018-01-22: 5 mg via INTRAVENOUS

## 2018-01-22 MED ORDER — FENTANYL CITRATE (PF) 100 MCG/2ML IJ SOLN
INTRAMUSCULAR | Status: DC | PRN
  Administered 2018-01-22: 25 ug via INTRAVENOUS

## 2018-01-22 MED ORDER — LIDOCAINE HCL (PF) 0.5 % IJ SOLN
INTRAMUSCULAR | Status: DC | PRN
  Administered 2018-01-22: 35 mL via INTRAVENOUS

## 2018-01-22 MED ORDER — MIDAZOLAM HCL 2 MG/2ML IJ SOLN
INTRAMUSCULAR | Status: AC
  Filled 2018-01-22: qty 2

## 2018-01-22 MED ORDER — CEFAZOLIN SODIUM-DEXTROSE 2-4 GM/100ML-% IV SOLN
2.0000 g | INTRAVENOUS | Status: DC
  Filled 2018-01-22: qty 100

## 2018-01-22 MED ORDER — PROPOFOL 500 MG/50ML IV EMUL
INTRAVENOUS | Status: DC | PRN
  Administered 2018-01-22: 50 ug/kg/min via INTRAVENOUS

## 2018-01-22 MED ORDER — TRAMADOL HCL 50 MG PO TABS: 50.0000 mg | ORAL_TABLET | Freq: Four times a day (QID) | ORAL | 0 refills | Status: AC | PRN

## 2018-01-22 MED ORDER — CEFAZOLIN SODIUM-DEXTROSE 2-4 GM/100ML-% IV SOLN
INTRAVENOUS | Status: AC
  Filled 2018-01-22: qty 100

## 2018-01-22 MED ORDER — LACTATED RINGERS IV SOLN
INTRAVENOUS | Status: DC
  Administered 2018-01-22: 09:00:00 via INTRAVENOUS
  Filled 2018-01-22: qty 1000

## 2018-01-22 MED ORDER — ONDANSETRON HCL 4 MG/2ML IJ SOLN
INTRAMUSCULAR | Status: DC | PRN
  Administered 2018-01-22: 4 mg via INTRAVENOUS

## 2018-01-22 MED ORDER — PROMETHAZINE HCL 25 MG/ML IJ SOLN
6.2500 mg | INTRAMUSCULAR | Status: DC | PRN
  Filled 2018-01-22: qty 1

## 2018-01-22 MED ORDER — BUPIVACAINE HCL (PF) 0.25 % IJ SOLN
INTRAMUSCULAR | Status: DC | PRN
  Administered 2018-01-22: 6 mL

## 2018-01-22 MED ORDER — CEFAZOLIN SODIUM-DEXTROSE 2-3 GM-%(50ML) IV SOLR
INTRAVENOUS | Status: DC | PRN
  Administered 2018-01-22: 2 g via INTRAVENOUS

## 2018-01-22 MED ORDER — FENTANYL CITRATE (PF) 100 MCG/2ML IJ SOLN
25.0000 ug | INTRAMUSCULAR | Status: DC | PRN
  Filled 2018-01-22: qty 1

## 2018-01-22 SURGICAL SUPPLY — 35 items
BLADE SURG 15 STRL LF DISP TIS (BLADE) ×1 IMPLANT
BLADE SURG 15 STRL SS (BLADE) ×2
BNDG COHESIVE 3X5 TAN STRL LF (GAUZE/BANDAGES/DRESSINGS) ×6 IMPLANT
BNDG COHESIVE 4X5 TAN STRL (GAUZE/BANDAGES/DRESSINGS) ×3 IMPLANT
BNDG ESMARK 4X9 LF (GAUZE/BANDAGES/DRESSINGS) IMPLANT
BNDG GAUZE ELAST 4 BULKY (GAUZE/BANDAGES/DRESSINGS) ×3 IMPLANT
CHLORAPREP W/TINT 26ML (MISCELLANEOUS) ×3 IMPLANT
CORD BIPOLAR FORCEPS 12FT (ELECTRODE) ×3 IMPLANT
COVER BACK TABLE 60X90IN (DRAPES) ×3 IMPLANT
COVER MAYO STAND STRL (DRAPES) ×3 IMPLANT
CUFF TOURNIQUET SINGLE 18IN (TOURNIQUET CUFF) ×3 IMPLANT
DRAPE EXTREMITY T 121X128X90 (DRAPE) ×3 IMPLANT
DRAPE SURG 17X23 STRL (DRAPES) ×3 IMPLANT
DRSG PAD ABDOMINAL 8X10 ST (GAUZE/BANDAGES/DRESSINGS) ×3 IMPLANT
GAUZE SPONGE 4X4 12PLY STRL (GAUZE/BANDAGES/DRESSINGS) ×3 IMPLANT
GAUZE XEROFORM 1X8 LF (GAUZE/BANDAGES/DRESSINGS) ×3 IMPLANT
GLOVE BIOGEL PI IND STRL 8.5 (GLOVE) ×1 IMPLANT
GLOVE BIOGEL PI INDICATOR 8.5 (GLOVE) ×2
GLOVE SURG ORTHO 8.0 STRL STRW (GLOVE) ×3 IMPLANT
GOWN STRL REUS W/ TWL LRG LVL3 (GOWN DISPOSABLE) ×1 IMPLANT
GOWN STRL REUS W/TWL LRG LVL3 (GOWN DISPOSABLE) ×2
GOWN STRL REUS W/TWL XL LVL3 (GOWN DISPOSABLE) ×3 IMPLANT
NEEDLE PRECISIONGLIDE 27X1.5 (NEEDLE) IMPLANT
NS IRRIG 1000ML POUR BTL (IV SOLUTION) ×3 IMPLANT
PACK BASIN DAY SURGERY FS (CUSTOM PROCEDURE TRAY) ×3 IMPLANT
PAD ABD 8X10 STRL (GAUZE/BANDAGES/DRESSINGS) ×3 IMPLANT
PAD CAST 4YDX4 CTTN HI CHSV (CAST SUPPLIES) ×1 IMPLANT
PADDING CAST COTTON 4X4 STRL (CAST SUPPLIES) ×2
STOCKINETTE 4X48 STRL (DRAPES) ×3 IMPLANT
SUT ETHILON 4 0 PS 2 18 (SUTURE) ×3 IMPLANT
SUT VICRYL 4-0 PS2 18IN ABS (SUTURE) IMPLANT
SYR BULB 3OZ (MISCELLANEOUS) ×3 IMPLANT
SYR CONTROL 10ML LL (SYRINGE) IMPLANT
TOWEL OR 17X24 6PK STRL BLUE (TOWEL DISPOSABLE) ×3 IMPLANT
UNDERPAD 30X30 (UNDERPADS AND DIAPERS) ×3 IMPLANT

## 2018-01-22 NOTE — Transfer of Care (Signed)
Immediate Anesthesia Transfer of Care Note  Patient: Coralyn Kenagy  Procedure(s) Performed: RIGHT CARPAL TUNNEL RELEASE (Right )  Patient Location: PACU  Anesthesia Type:MAC and Bier block  Level of Consciousness: awake, alert  and oriented  Airway & Oxygen Therapy: Patient Spontanous Breathing and Patient connected to face mask oxygen  Post-op Assessment: Report given to RN and Post -op Vital signs reviewed and stable  Post vital signs: Reviewed and stable  Last Vitals:  Vitals Value Taken Time  BP 135/68 01/22/2018 11:18 AM  Temp    Pulse 65 01/22/2018 11:21 AM  Resp 16 01/22/2018 11:21 AM  SpO2 95 % 01/22/2018 11:21 AM  Vitals shown include unvalidated device data.  Last Pain:  Vitals:   01/22/18 1119  TempSrc:   PainSc: (Pended) 0-No pain      Patients Stated Pain Goal: 4 (60/45/40 9811)  Complications: No apparent anesthesia complications

## 2018-01-22 NOTE — Discharge Instructions (Addendum)
Post Anesthesia Home Care Instructions  Activity: Get plenty of rest for the remainder of the day. A responsible individual must stay with you for 24 hours following the procedure.  For the next 24 hours, DO NOT: -Drive a car -Paediatric nurse -Drink alcoholic beverages -Take any medication unless instructed by your physician -Make any legal decisions or sign important papers.  Meals: Start with liquid foods such as gelatin or soup. Progress to regular foods as tolerated. Avoid greasy, spicy, heavy foods. If nausea and/or vomiting occur, drink only clear liquids until the nausea and/or vomiting subsides. Call your physician if vomiting continues.  Special Instructions/Symptoms: Your throat may feel dry or sore from the anesthesia or the breathing tube placed in your throat during surgery. If this causes discomfort, gargle with warm salt water. The discomfort should disappear within 24 hours.  If you had a scopolamine patch placed behind your ear for the management of post- operative nausea and/or vomiting:  1. The medication in the patch is effective for 72 hours, after which it should be removed.  Wrap patch in a tissue and discard in the trash. Wash hands thoroughly with soap and water. 2. You may remove the patch earlier than 72 hours if you experience unpleasant side effects which may include dry mouth, dizziness or visual disturbances. 3. Avoid touching the patch. Wash your hands with soap and water after contact with the patch.   Regional Anesthesia Blocks  1. Numbness or the inability to move the "blocked" extremity may last from 3-48 hours after placement. The length of time depends on the medication injected and your individual response to the medication. If the numbness is not going away after 48 hours, call your surgeon.  2. The extremity that is blocked will need to be protected until the numbness is gone and the  Strength has returned. Because you cannot feel it, you will  need to take extra care to avoid injury. Because it may be weak, you may have difficulty moving it or using it. You may not know what position it is in without looking at it while the block is in effect.  3. For blocks in the legs and feet, returning to weight bearing and walking needs to be done carefully. You will need to wait until the numbness is entirely gone and the strength has returned. You should be able to move your leg and foot normally before you try and bear weight or walk. You will need someone to be with you when you first try to ensure you do not fall and possibly risk injury.  4. Bruising and tenderness at the needle site are common side effects and will resolve in a few days.  5. Persistent numbness or new problems with movement should be communicated to the surgeon or the/ [facility] [phone]).   Hand Center Instructions Hand Surgery  Wound Care: Keep your hand elevated above the level of your heart.  Do not allow it to dangle by your side.  Keep the dressing dry and do not remove it unless your doctor advises you to do so.  He will usually change it at the time of your post-op visit.  Moving your fingers is advised to stimulate circulation but will depend on the site of your surgery.  If you have a splint applied, your doctor will advise you regarding movement.  Activity: Do not drive or operate machinery today.  Rest today and then you may return to your normal activity and  work as indicated by Naval architect.  Diet:  Drink liquids today or eat a light diet.  You may resume a regular diet tomorrow.    General expectations: Pain for two to three days. Fingers may become slightly swollen.  Call your doctor if any of the following occur: Severe pain not relieved by pain medication. Elevated temperature. Dressing soaked with blood. Inability to move fingers. White or bluish color to fingers.

## 2018-01-22 NOTE — Anesthesia Preprocedure Evaluation (Signed)
Anesthesia Evaluation  Patient identified by MRN, date of birth, ID band Patient awake    Reviewed: Allergy & Precautions, NPO status , Patient's Chart, lab work & pertinent test results  Airway Mallampati: II  TM Distance: >3 FB Neck ROM: Full    Dental no notable dental hx.    Pulmonary neg pulmonary ROS,    Pulmonary exam normal breath sounds clear to auscultation       Cardiovascular hypertension, Pt. on medications Normal cardiovascular exam Rhythm:Regular Rate:Normal     Neuro/Psych negative neurological ROS  negative psych ROS   GI/Hepatic negative GI ROS, Neg liver ROS,   Endo/Other  negative endocrine ROSHypothyroidism   Renal/GU negative Renal ROS  negative genitourinary   Musculoskeletal negative musculoskeletal ROS (+)   Abdominal   Peds negative pediatric ROS (+)  Hematology negative hematology ROS (+)   Anesthesia Other Findings   Reproductive/Obstetrics negative OB ROS                             Anesthesia Physical Anesthesia Plan  ASA: III  Anesthesia Plan: MAC and Bier Block and Bier Block-LIDOCAINE ONLY   Post-op Pain Management:    Induction: Intravenous  PONV Risk Score and Plan:   Airway Management Planned: Simple Face Mask  Additional Equipment:   Intra-op Plan:   Post-operative Plan:   Informed Consent: I have reviewed the patients History and Physical, chart, labs and discussed the procedure including the risks, benefits and alternatives for the proposed anesthesia with the patient or authorized representative who has indicated his/her understanding and acceptance.   Dental advisory given  Plan Discussed with: CRNA and Surgeon  Anesthesia Plan Comments:         Anesthesia Quick Evaluation

## 2018-01-22 NOTE — Anesthesia Postprocedure Evaluation (Signed)
Anesthesia Post Note  Patient: Linda Copeland  Procedure(s) Performed: RIGHT CARPAL TUNNEL RELEASE (Right )     Patient location during evaluation: PACU Anesthesia Type: MAC Level of consciousness: awake and alert Pain management: pain level controlled Vital Signs Assessment: post-procedure vital signs reviewed and stable Respiratory status: spontaneous breathing, nonlabored ventilation, respiratory function stable and patient connected to nasal cannula oxygen Cardiovascular status: stable and blood pressure returned to baseline Postop Assessment: no apparent nausea or vomiting Anesthetic complications: no    Last Vitals:  Vitals:   01/22/18 1130 01/22/18 1145  BP: (!) 167/89 (!) 178/64  Pulse: 64 66  Resp: 14 11  Temp:    SpO2: 94% 99%    Last Pain:  Vitals:   01/22/18 1119  TempSrc:   PainSc: 0-No pain                 Kenishia Plack A.

## 2018-01-22 NOTE — Anesthesia Procedure Notes (Signed)
Anesthesia Regional Block: Bier block (IV Regional)   Pre-Anesthetic Checklist: ,, timeout performed, Correct Patient, Correct Site, Correct Laterality, Correct Procedure, Correct Position, site marked, Risks and benefits discussed, Surgical consent,  Pre-op evaluation,  At surgeon's request  Laterality: Right      Procedures:,,,,,, Esmarch exsanguination, single tourniquet utilized, #20gu IV placed  Narrative:   Performed by: Personally  CRNA: Verita Lamb, CRNA

## 2018-01-22 NOTE — Brief Op Note (Signed)
01/22/2018  11:15 AM  PATIENT:  Linda Copeland  84 y.o. female  PRE-OPERATIVE DIAGNOSIS:  RIGHT CARPAL TUNNEL SYNDROME  POST-OPERATIVE DIAGNOSIS:  RIGHT CARPAL TUNNEL SYNDROME  PROCEDURE:  Procedure(s): RIGHT CARPAL TUNNEL RELEASE (Right)  SURGEON:  Surgeon(s) and Role:    * Daryll Brod, MD - Primary  PHYSICIAN ASSISTANT:   ASSISTANTS: none   ANESTHESIA:   local and regional  IV sedation EBL: 2 ml BLOOD ADMINISTERED:none  DRAINS: none   LOCAL MEDICATIONS USED:  BUPIVICAINE   SPECIMEN:  No Specimen  DISPOSITION OF SPECIMEN:  N/A  COUNTS:  YES  TOURNIQUET:   Total Tourniquet Time Documented: Forearm (Right) - 15 minutes Total: Forearm (Right) - 15 minutes   DICTATION: .Other Dictation: Dictation Number [id number]  PLAN OF CARE: Discharge to home after PACU  PATIENT DISPOSITION:  PACU - hemodynamically stable.

## 2018-01-22 NOTE — Op Note (Signed)
NAMEJANILA, Linda Copeland               ACCOUNT NO.:  [id number]  MEDICAL RECORD NO.:  [id number]  LOCATION:                                 FACILITY:  PHYSICIAN:  Daryll Brod, M.D.            DATE OF BIRTH:  DATE OF PROCEDURE:  01/22/2018 DATE OF DISCHARGE:                              OPERATIVE REPORT   PREOPERATIVE DIAGNOSIS:  Carpal tunnel syndrome, right hand.  POSTOPERATIVE DIAGNOSIS:  Carpal tunnel syndrome, right hand.  OPERATION:  Decompression, right median nerve.  SURGEON:  Daryll Brod, MD.  ASSISTANT:  None.  ANESTHESIA:  Forearm-based IV regional with local infiltration and IV sedation.  PLACE OF SURGERY:  [facility].  HISTORY:  The patient is an 84 year old female with a history of carpal tunnel syndrome, nerve conductions positive.  This has not responded to conservative treatment.  She has elected to undergo surgical decompression.  Pre, peri, and postoperative courses have been discussed along with risks and complications.  She is aware that there is no guarantee to the surgery, the possibility of infection, recurrence of injury to arteries, nerves, and tendons, incomplete relief of symptoms, and dystrophy.  In the preoperative area, the patient is seen, the extremity marked by both patient and surgeon, antibiotic given.  DESCRIPTION OF PROCEDURE:  The patient was brought to the operating room where a forearm-based IV regional anesthetic was carried out without difficulty.  She was prepped using ChloraPrep in a supine position with the right arm free.  A 3-minute dry time was allowed and time-out was taken confirming the patient and procedure.  After adequate anesthesia was afforded, a longitudinal incision was made in the right palm and carried down through subcutaneous tissue.  Bleeders were electrocauterized with bipolar.  The palmar fascia was split.  The superficial palmar arch was identified.  The flexor tendon to the ring and little finger  was identified.  To the ulnar side of the median nerve, the carpal retinaculum was incised with sharp dissection.  A right angle and Sewell retractor were placed between skin and forearm fascia.  The fascia was released for approximately 2 cm proximal to the wrist crease under direct vision.  This was done after dissecting deep structures proximally.  The wound was copiously irrigated with saline. The canal was explored.  The motor branch entered into muscle distally. A persistent median artery was present.  The wound was copiously irrigated with saline and the skin was closed with interrupted 4-0 nylon sutures.  A sterile compressive dressing was applied.  On deflation of the tourniquet, all fingers immediately pinked.  A local infiltration with 0.25% bupivacaine without epinephrine was given.  Approximately 8 mL was used.  The patient tolerated the procedure well and will be discharged to home to return to the [facility] in 1 week.          ______________________________ Daryll Brod, M.D.     GK/MEDQ  D:  01/22/2018  T:  01/22/2018  Job:  [id number]

## 2018-01-22 NOTE — H&P (Signed)
Linda Copeland is an 84 y.o. female.   Chief Complaint: numbness right handHPI: Linda Copeland is an 85 year old right-hand-dominant female referred by Dr. Marisue Humble for consultation regarding numbness and tingling in her right hand. This to the thumb index middle fingers. This been going on for approximately 3 months. She has no history of injury. Her son has got her pain away spray which has helped. She has no history of injury to the hand she does complain of some discomfort in her neck but no injury. She is not awakened at night. She states nothing seems to make it worse. She has a history of diabetes thyroid problems no discrete history of arthritis and no history of gout. He has had nerve conductions done by Dr. Posey Pronto. These were performed 11/28/2017. This reveals carpal tunnel syndrome on her right side with no response to the sensory component. Motor delay of 5.2. This showed changes proximally in the biceps and deltoid.       Past Medical History:  Diagnosis Date  . Arthritis    bilateral severe facet L4-L5, L5-S1  . Carpal tunnel syndrome of right wrist   . Chronic cervical radiculopathy    C5  . Compression fracture of lumbar vertebra (HCC)    L2-L3  . Environmental and seasonal allergies   . Frequent urination   . Hematoma of frontal scalp 10/06/2016  . History of bronchitis   . History of humerus fracture 10/06/2016   Right proximal  . History of subdural hematoma 10/06/2016   Left falcotentorial   . HOH (hard of hearing)   . Hypertension   . Hypothyroidism   . Nasal fracture 10/06/2016  . OA (osteoarthritis)    AC glenohumeral joinr  . Osteopenia   . Spinal stenosis at L4-L5 level     Past Surgical History:  Procedure Laterality Date  . COLONOSCOPY      History reviewed. No pertinent family history. Social History:  reports that she has never smoked. She has never used smokeless tobacco. She reports that she drinks alcohol. She reports that she does not use  drugs.  Allergies:  Allergies  Allergen Reactions  . Codeine Nausea And Vomiting and Other (See Comments)    uknown  . Biaxin [Clarithromycin] Other (See Comments)    Sweats  . Caffeine     Shaky    Medications Prior to Admission  Medication Sig Dispense Refill  . acetaminophen (TYLENOL) 325 MG tablet Take 2 tablets (650 mg total) by mouth every 6 (six) hours as needed for mild pain. (Patient taking differently: Take 325 mg by mouth daily. )    . bimatoprost (LUMIGAN) 0.03 % ophthalmic solution Place 1 drop into both eyes 2 (two) times daily.     . dorzolamide-timolol (COSOPT) 22.3-6.8 MG/ML ophthalmic solution Place 1 drop into the right eye 2 (two) times daily.    Marland Kitchen levothyroxine (SYNTHROID, LEVOTHROID) 100 MCG tablet Take 100 mcg by mouth daily before breakfast.    . lisinopril (PRINIVIL,ZESTRIL) 20 MG tablet Take 20 mg by mouth daily.    Marland Kitchen sulfamethoxazole-trimethoprim (BACTRIM DS,SEPTRA DS) 800-160 MG tablet TK 1 T PO BID FOR 7 DAYS  0    Results for orders placed or performed during the hospital encounter of 01/22/18 (from the past 48 hour(s))  I-STAT, chem 8     Status: Abnormal   Collection Time: 01/22/18  9:08 AM  Result Value Ref Range   Sodium 140 135 - 145 mmol/L   Potassium 4.1 3.5 - 5.1 mmol/L  Chloride 104 101 - 111 mmol/L   BUN 8 6 - 20 mg/dL   Creatinine, Ser 0.40 (L) 0.44 - 1.00 mg/dL   Glucose, Bld 99 65 - 99 mg/dL   Calcium, Ion 1.27 1.15 - 1.40 mmol/L   TCO2 26 22 - 32 mmol/L   Hemoglobin 13.6 12.0 - 15.0 g/dL   HCT 40.0 36.0 - 46.0 %    No results found.   Pertinent items are noted in HPI.  Blood pressure (!) 186/79, pulse 67, temperature 98.1 F (36.7 C), temperature source Oral, resp. rate 16, height 5' (1.524 m), weight 66.1 kg (145 lb 11.2 oz), SpO2 96 %.  General appearance: alert, cooperative and appears stated age Head: Normocephalic, without obvious abnormality Neck: no JVD Resp: clear to auscultation bilaterally Cardio: regular rate  and rhythm, S1, S2 normal, no murmur, click, rub or gallop GI: soft, non-tender; bowel sounds normal; no masses,  no organomegaly Extremities: numbness right hand Pulses: 2+ and symmetric Skin: Skin color, texture, turgor normal. No rashes or lesions Neurologic: Grossly normal Incision/Wound: na  Assessment/Plan  Assessment:  1. Carpal tunnel syndrome of right wrist  2. Primary osteoarthritis of first carpometacarpal joint of right hand    Plan: We have discussed with her that I have nothing further to offer except for surgical intervention. She would like to discuss that. Pre-peri-and postoperative course been discussed along with risks and complications. She is aware there is no guarantee to the surgery the possibility of infection recurrence injury to arteries nerves tendons complete relief symptoms. She is given the opportunity to think this over. At first she states she did not want to have an operation but does not want to live with it the way it is and has reflected and decided she would like to proceed to have carpal tunnel release done on her right side. She is scheduled as an outpatient under regional anesthesia.     Brode Sculley R 01/22/2018, 9:48 AM

## 2018-01-22 NOTE — Op Note (Signed)
Other Dictation: Dictation Number [id number]

## 2018-01-23 ENCOUNTER — Encounter (HOSPITAL_BASED_OUTPATIENT_CLINIC_OR_DEPARTMENT_OTHER): Payer: Self-pay | Admitting: Orthopedic Surgery

## 2018-01-23 DIAGNOSIS — H409 Unspecified glaucoma: Secondary | ICD-10-CM | POA: Diagnosis not present

## 2018-01-23 DIAGNOSIS — J309 Allergic rhinitis, unspecified: Secondary | ICD-10-CM | POA: Diagnosis not present

## 2018-01-23 DIAGNOSIS — R350 Frequency of micturition: Secondary | ICD-10-CM | POA: Diagnosis not present

## 2018-01-23 DIAGNOSIS — I10 Essential (primary) hypertension: Secondary | ICD-10-CM | POA: Diagnosis not present

## 2018-01-23 DIAGNOSIS — D692 Other nonthrombocytopenic purpura: Secondary | ICD-10-CM | POA: Diagnosis not present

## 2018-01-23 DIAGNOSIS — E119 Type 2 diabetes mellitus without complications: Secondary | ICD-10-CM | POA: Diagnosis not present

## 2018-01-23 DIAGNOSIS — E039 Hypothyroidism, unspecified: Secondary | ICD-10-CM | POA: Diagnosis not present

## 2018-02-11 ENCOUNTER — Other Ambulatory Visit: Payer: Medicare HMO

## 2018-02-11 ENCOUNTER — Ambulatory Visit (INDEPENDENT_AMBULATORY_CARE_PROVIDER_SITE_OTHER): Payer: Medicare HMO | Admitting: Podiatry

## 2018-02-11 DIAGNOSIS — M79675 Pain in left toe(s): Secondary | ICD-10-CM

## 2018-02-11 DIAGNOSIS — M79674 Pain in right toe(s): Secondary | ICD-10-CM | POA: Diagnosis not present

## 2018-02-11 DIAGNOSIS — B351 Tinea unguium: Secondary | ICD-10-CM

## 2018-02-13 NOTE — Progress Notes (Signed)
Subjective:   Patient ID: Linda Copeland, female   DOB: 84 y.o.   MRN: [id number]   HPI Patient presents with thick yellow nail beds that she cannot cut and become painful with shoe gear   ROS      Objective:  Physical Exam  Neurovascular status unchanged with thick yellow brittle nailbeds 1-5 both feet that are painful with palpation     Assessment:  Mycotic nail infection with pain 1-5 both feet     Plan:  Debride painful nailbeds 1-5 both feet with no iatrogenic bleeding noted

## 2018-05-14 ENCOUNTER — Encounter (INDEPENDENT_AMBULATORY_CARE_PROVIDER_SITE_OTHER): Payer: Self-pay

## 2018-05-14 ENCOUNTER — Ambulatory Visit (INDEPENDENT_AMBULATORY_CARE_PROVIDER_SITE_OTHER): Payer: Medicare HMO | Admitting: Sports Medicine

## 2018-05-14 DIAGNOSIS — B351 Tinea unguium: Secondary | ICD-10-CM | POA: Diagnosis not present

## 2018-05-14 DIAGNOSIS — M79675 Pain in left toe(s): Secondary | ICD-10-CM | POA: Diagnosis not present

## 2018-05-14 DIAGNOSIS — M79674 Pain in right toe(s): Secondary | ICD-10-CM

## 2018-05-14 NOTE — Progress Notes (Signed)
Subjective: Linda Copeland is a 84 y.o. female patient seen today in office with complaint of mildly painful thickened and elongated toenails; unable to trim. Patient denies history of Diabetes, Neuropathy, or Vascular disease. Patient has no other pedal complaints at this time.   Patient Active Problem List   Diagnosis Date Noted  . Closed fracture of nasal bone with routine healing 10/11/2016  . Subdural hematoma ([place]) 10/05/2016    Current Outpatient Medications on File Prior to Visit  Medication Sig Dispense Refill  . acetaminophen (TYLENOL) 325 MG tablet Take 2 tablets (650 mg total) by mouth every 6 (six) hours as needed for mild pain. (Patient taking differently: Take 325 mg by mouth daily. )    . bimatoprost (LUMIGAN) 0.03 % ophthalmic solution Place 1 drop into both eyes 2 (two) times daily.     . dorzolamide-timolol (COSOPT) 22.3-6.8 MG/ML ophthalmic solution Place 1 drop into the right eye 2 (two) times daily.    Marland Kitchen levothyroxine (SYNTHROID, LEVOTHROID) 100 MCG tablet Take 100 mcg by mouth daily before breakfast.    . lisinopril (PRINIVIL,ZESTRIL) 20 MG tablet Take 20 mg by mouth daily.    Marland Kitchen sulfamethoxazole-trimethoprim (BACTRIM DS,SEPTRA DS) 800-160 MG tablet TK 1 T PO BID FOR 7 DAYS  0  . traMADol (ULTRAM) 50 MG tablet Take 1 tablet (50 mg total) by mouth every 6 (six) hours as needed. 15 tablet 0   No current facility-administered medications on file prior to visit.     Allergies  Allergen Reactions  . Codeine Nausea And Vomiting and Other (See Comments)    uknown  . Biaxin [Clarithromycin] Other (See Comments)    Sweats  . Caffeine     Shaky    Objective: Physical Exam  General: Well developed, nourished, no acute distress, awake, alert and oriented x 3  Vascular: Dorsalis pedis artery 1/4 bilateral, Posterior tibial artery 1/4 bilateral, skin temperature warm to warm proximal to distal bilateral lower extremities, ++ varicosities, Diminished pedal hair present  bilateral.  Neurological: Gross sensation present via light touch bilateral.   Dermatological: Skin is warm, dry, and supple bilateral, Nails 1-10 are tender, long, thick, and discolored with mild subungal debris, no webspace macerations present bilateral, no open lesions present bilateral, no callus/corns/hyperkeratotic tissue present bilateral. No signs of infection bilateral.  Musculoskeletal: No symptomatic boney deformities noted bilateral. Muscular strength within normal limits without painon range of motion for patient status. No pain with calf compression bilateral.  Assessment and Plan:  Problem List Items Addressed This Visit    None    Visit Diagnoses    Pain due to onychomycosis of toenails of both feet    -  Primary      -Examined patient.  -Discussed treatment options for painful mycotic nails. -Mechanically debrided and reduced mycotic nails with sterile nail nipper and dremel nail file without incident with assistance from nurse -Patient to return in 3 months for follow up evaluation or sooner if symptoms worsen.  Landis Martins, DPM

## 2018-05-23 DIAGNOSIS — R8290 Unspecified abnormal findings in urine: Secondary | ICD-10-CM | POA: Diagnosis not present

## 2018-05-23 DIAGNOSIS — R3915 Urgency of urination: Secondary | ICD-10-CM | POA: Diagnosis not present

## 2018-05-29 ENCOUNTER — Emergency Department (HOSPITAL_COMMUNITY)
Admission: EM | Admit: 2018-05-29 | Discharge: 2018-05-29 | Disposition: A | Discharge status: Home / Self Care | Payer: Medicare HMO | Attending: Emergency Medicine | Admitting: Emergency Medicine

## 2018-05-29 ENCOUNTER — Emergency Department (HOSPITAL_COMMUNITY): Payer: Medicare HMO

## 2018-05-29 ENCOUNTER — Encounter (HOSPITAL_COMMUNITY): Payer: Self-pay | Admitting: Emergency Medicine

## 2018-05-29 DIAGNOSIS — Y929 Unspecified place or not applicable: Secondary | ICD-10-CM | POA: Diagnosis not present

## 2018-05-29 DIAGNOSIS — Y999 Unspecified external cause status: Secondary | ICD-10-CM | POA: Insufficient documentation

## 2018-05-29 DIAGNOSIS — R109 Unspecified abdominal pain: Secondary | ICD-10-CM | POA: Diagnosis not present

## 2018-05-29 DIAGNOSIS — S39012A Strain of muscle, fascia and tendon of lower back, initial encounter: Secondary | ICD-10-CM | POA: Diagnosis not present

## 2018-05-29 DIAGNOSIS — R1032 Left lower quadrant pain: Secondary | ICD-10-CM | POA: Insufficient documentation

## 2018-05-29 DIAGNOSIS — Y93E6 Activity, residential relocation: Secondary | ICD-10-CM | POA: Insufficient documentation

## 2018-05-29 DIAGNOSIS — R350 Frequency of micturition: Secondary | ICD-10-CM | POA: Insufficient documentation

## 2018-05-29 DIAGNOSIS — M545 Low back pain: Secondary | ICD-10-CM | POA: Diagnosis not present

## 2018-05-29 DIAGNOSIS — X503XXA Overexertion from repetitive movements, initial encounter: Secondary | ICD-10-CM | POA: Insufficient documentation

## 2018-05-29 DIAGNOSIS — T148XXA Other injury of unspecified body region, initial encounter: Secondary | ICD-10-CM

## 2018-05-29 LAB — URINALYSIS, ROUTINE W REFLEX MICROSCOPIC
Bilirubin Urine: NEGATIVE
Glucose, UA: NEGATIVE mg/dL
Hgb urine dipstick: NEGATIVE
KETONES UR: NEGATIVE mg/dL
LEUKOCYTES UA: NEGATIVE
NITRITE: NEGATIVE
PROTEIN: NEGATIVE mg/dL
Specific Gravity, Urine: 1.024 (ref 1.005–1.030)
pH: 5 (ref 5.0–8.0)

## 2018-05-29 MED ORDER — METHOCARBAMOL 500 MG PO TABS: 500.0000 mg | ORAL_TABLET | Freq: Every evening | ORAL | 0 refills | Status: DC | PRN

## 2018-05-29 MED ORDER — ONDANSETRON HCL 4 MG/2ML IJ SOLN: 4.0000 mg | Freq: Once | INTRAMUSCULAR | Status: DC

## 2018-05-29 MED ORDER — LIDOCAINE 5 % EX PTCH
1.0000 | MEDICATED_PATCH | CUTANEOUS | Status: DC
  Administered 2018-05-29: 1 via TRANSDERMAL
  Filled 2018-05-29: qty 1

## 2018-05-29 MED ORDER — NALOXONE HCL 0.4 MG/ML IJ SOLN: 0.4000 mg | Freq: Once | INTRAMUSCULAR | Status: DC

## 2018-05-29 MED ORDER — LIDOCAINE 5 % EX PTCH: 1.0000 | MEDICATED_PATCH | CUTANEOUS | 0 refills | Status: DC

## 2018-05-29 NOTE — Discharge Instructions (Addendum)
Continue taking aleve 2 times a day with meals.  Do not take other anti-inflammatories at the same time (Advil, Motrin, ibuprofen, Aleve). You may supplement with Tylenol if you need further pain control. Use Robaxin as needed for pain at night. Have caution, as this may make you tired or groggy. Do not drive or operate heavy machinery while taking this medication. Be careful, this medication may make your more prone to falls.  Use muscle creams (bengay, icy hot, salonpas) as needed for pain.  Use lidocaine patches for pain.  Follow up with your primary care doctor if pain is not improving.  Return to the ER if you develop high fevers, numbness, loss of bowel or bladder control, or any new or concerning symptoms.

## 2018-05-29 NOTE — ED Provider Notes (Signed)
[facility] DEPT Provider Note   CSN: [id number] Arrival date & time: 05/29/18  1657     History   Chief Complaint Chief Complaint  Patient presents with  . Flank Pain    HPI Linda Copeland is a 84 y.o. female ending for evaluation of back pain.  Patient states for the past 12 days, she has had persistent low back pain.  She reports it is worse in the left side, but it is present bilaterally.  No pain at rest, pain worsens when she moves, sits, stands.  She has been taking Advil/Aleve without improvement of symptoms.  Additionally, patient reports urinary frequency/urgency.  She denies hematuria or dysuria.  She denies loss of bowel or bladder control.  She denies fevers, chills, chest pain, shortness breath, nausea, vomiting, abdominal pain.  She denies numbness or tingling of the legs.  She denies radiation of the pain down her legs.  She denies recent fall, trauma, or injury, although she states she has frequent falls.  Additional history obtained from son, who states they have been moving and patient has been doing a lot more movement than normal.  Patient walks with a walker at home.  HPI  Past Medical History:  Diagnosis Date  . Arthritis    bilateral severe facet L4-L5, L5-S1  . Carpal tunnel syndrome of right wrist   . Chronic cervical radiculopathy    C5  . Compression fracture of lumbar vertebra (HCC)    L2-L3  . Environmental and seasonal allergies   . Frequent urination   . Hematoma of frontal scalp 10/06/2016  . History of bronchitis   . History of humerus fracture 10/06/2016   Right proximal  . History of subdural hematoma 10/06/2016   Left falcotentorial   . HOH (hard of hearing)   . Hypertension   . Hypothyroidism   . Nasal fracture 10/06/2016  . OA (osteoarthritis)    AC glenohumeral joinr  . Osteopenia   . Spinal stenosis at L4-L5 level     Patient Active Problem List   Diagnosis Date Noted  . Closed fracture of  nasal bone with routine healing 10/11/2016  . Subdural hematoma ([place]) 10/05/2016    Past Surgical History:  Procedure Laterality Date  . CARPAL TUNNEL RELEASE Right 01/22/2018   Procedure: RIGHT CARPAL TUNNEL RELEASE;  Surgeon: Daryll Brod, MD;  Location: [place] [facility];  Service: Orthopedics;  Laterality: Right;  . COLONOSCOPY       OB History   None      Home Medications    Prior to Admission medications   Medication Sig Start Date End Date Taking? Authorizing Provider  bimatoprost (LUMIGAN) 0.03 % ophthalmic solution Place 1 drop into both eyes 2 (two) times daily.    Yes [provider]  dorzolamide-timolol (COSOPT) 22.3-6.8 MG/ML ophthalmic solution Place 1 drop into the right eye 2 (two) times daily. 09/09/16  Yes [provider]  levothyroxine (SYNTHROID, LEVOTHROID) 100 MCG tablet Take 100 mcg by mouth daily before breakfast. 09/09/16  Yes [provider]  lisinopril (PRINIVIL,ZESTRIL) 20 MG tablet Take 20 mg by mouth daily. 09/09/16  Yes [provider]  LUMIGAN 0.01 % SOLN Place 1 drop into both eyes at bedtime.  03/18/18  Yes [provider]  naproxen sodium (ALEVE) 220 MG tablet Take 220 mg by mouth 2 (two) times daily as needed (pain).   Yes [provider]  acetaminophen (TYLENOL) 325 MG tablet Take 2 tablets (650 mg total) by  mouth every 6 (six) hours as needed for mild pain. Patient not taking: Reported on 05/29/2018 10/07/16   Jill Alexanders, PA-C  lidocaine (LIDODERM) 5 % Place 1 patch onto the skin daily. Remove & Discard patch within 12 hours or as directed by MD 05/29/18   Athziri Freundlich, PA-C  methocarbamol (ROBAXIN) 500 MG tablet Take 1 tablet (500 mg total) by mouth at bedtime as needed for muscle spasms. 05/29/18   Timtohy Broski, PA-C  traMADol (ULTRAM) 50 MG tablet Take 1 tablet (50 mg total) by mouth every 6 (six) hours as needed. Patient not taking: Reported on 05/29/2018 01/22/18 01/22/19   Daryll Brod, MD    Family History No family history on file.  Social History Social History   Tobacco Use  . Smoking status: Never Smoker  . Smokeless tobacco: Never Used  Substance Use Topics  . Alcohol use: Yes    Comment: rare   . Drug use: Never     Allergies   Codeine; Biaxin [clarithromycin]; and Caffeine   Review of Systems Review of Systems  Genitourinary: Positive for frequency and urgency.  Musculoskeletal: Positive for back pain.  All other systems reviewed and are negative.    Physical Exam Updated Vital Signs BP 140/64 (BP Location: Left Arm)   Pulse 83   Temp 98.3 F (36.8 C) (Oral)   Resp 16   SpO2 99%   Physical Exam  Constitutional: She is oriented to person, place, and time. She appears well-developed and well-nourished. No distress.  Elderly female resting comfortably in the bed in no acute distress.  HENT:  Head: Normocephalic and atraumatic.  No obvious head injury  Eyes: Pupils are equal, round, and reactive to light. Conjunctivae and EOM are normal.  Neck: Normal range of motion. Neck supple.  Cardiovascular: Normal rate, regular rhythm and intact distal pulses.  Pulmonary/Chest: Effort normal and breath sounds normal. No respiratory distress. She has no wheezes.  Abdominal: Soft. She exhibits no distension and no mass. There is no tenderness. There is no guarding.  Musculoskeletal: Normal range of motion. She exhibits tenderness.  Tenderness palpation of left low back musculature.  No pain over midline spine.  Minimal pain of right low back musculature.  Strength of lower extremity is intact bilaterally.  Sensation intact bilaterally.  Pedal pulses intact bilaterally.  No pain with straight leg raise.  No numbness, no saddle paresthesias.  Neurological: She is alert and oriented to person, place, and time. She displays normal reflexes. No sensory deficit.  Skin: Skin is warm and dry. Capillary refill takes less than 2 seconds.    Psychiatric: She has a normal mood and affect.  Nursing note and vitals reviewed.    ED Treatments / Results  Labs (all labs ordered are listed, but only abnormal results are displayed) Labs Reviewed  URINALYSIS, ROUTINE W REFLEX MICROSCOPIC    EKG None  Radiology Dg Lumbar Spine Complete  Result Date: 05/29/2018 CLINICAL DATA:  Flank and back pain for the past 12 days. EXAM: LUMBAR SPINE - COMPLETE 4+ VIEW COMPARISON:  Lumbar spine radiographs-12/18/2016; 11/20/2016; lumbar spine CT-11/08/2016 FINDINGS: There are 5 non rib-bearing lumbar type vertebral bodies with diminutive ribs seen bilaterally at T12. There is a mild scoliotic curvature of the thoracolumbar spine with dominant caudal component convex the left measuring approximately 9 degrees (as measured from the superior endplate of L2 to the inferior endplate of L4). No definite anterolisthesis or retrolisthesis given obliquity. Severe (approximately 75%) compression deformity involving the L3 appears  similar to lumbar spine CT performed 11/2016. Remaining lumbar vertebral body heights appear preserved. Moderate to severe multilevel lumbar spine DDD, worse at T12-L1, with disc space height loss, endplate irregularity and sclerosis. Limited visualization of the bilateral SI joints is normal. IMPRESSION: 1. No definite acute findings. 2. Grossly unchanged severe (approximately 75%) compression deformity of L3, similar to the 11/2016 examination. 3. Moderate to severe multilevel lumbar spine DDD, likely worse at T12-L1. Electronically Signed   By: Sandi Mariscal M.D.   On: 05/29/2018 20:41    Procedures Procedures (including critical care time)  Medications Ordered in ED Medications  lidocaine (LIDODERM) 5 % 1 patch (has no administration in time range)     Initial Impression / Assessment and Plan / ED Course  I have reviewed the triage vital signs and the nursing notes.  Pertinent labs & imaging results that were available during  my care of the patient were reviewed by me and considered in my medical decision making (see chart for details).     Patient presenting for evaluation of low back pain.  Physical exam reassuring, no obvious neurologic deficits.  Exam is not consistent with spinal cord compression/cauda equina syndrome.  Pain is reproducible with palpation of the musculature, no pain over midline spine.  Patient with a history of compression fracture of the lumbar spine.  Will obtain back x-ray and give Lidoderm patch for pain.  Case discussed with attending, Dr. Sherry Ruffing evaluated the patient.  X-ray viewed interpreted by me, no new fractures or dislocations.  Shows stable deformity/compression of L3.  At this time, doubt vertebral injury, infection, spinal cord compression, myelopathy, cauda equina syndrome.  Urine negative for infection.  Will treat for muscular skeletal pain, and have patient follow-up with her primary care as needed.  Will avoid narcotics, as patient already has frequent falls.  Will give a short course of Robaxin for muscular pain, patient only to use at night.  Discussed risks of falls.  Lidoderm patches as needed for further pain.  At this time, patient appears safe for discharge.  Return precautions given.  Patient states she understands and agrees to plan.   Final Clinical Impressions(s) / ED Diagnoses   Final diagnoses:  Left flank pain  Muscle strain    ED Discharge Orders         Ordered    lidocaine (LIDODERM) 5 %  Every 24 hours     05/29/18 2307    methocarbamol (ROBAXIN) 500 MG tablet  At bedtime PRN     05/29/18 2307           Franchot Heidelberg, PA-C 05/30/18 0054    Tegeler, Gwenyth Allegra, MD 05/30/18 [phone]

## 2018-05-29 NOTE — ED Triage Notes (Signed)
Pt c/o flank and back pain for 12 days. Reports her doctor told her to take ibuprofen but not helping. Pt reports pain is worse when she stands up, sits down or gets out of bed. Pt was seen at her PCP on 8/22 for urinary frequency

## 2018-06-12 ENCOUNTER — Encounter (INDEPENDENT_AMBULATORY_CARE_PROVIDER_SITE_OTHER): Payer: Self-pay | Admitting: Family Medicine

## 2018-06-12 ENCOUNTER — Ambulatory Visit (INDEPENDENT_AMBULATORY_CARE_PROVIDER_SITE_OTHER): Payer: Medicare HMO | Admitting: Family Medicine

## 2018-06-12 DIAGNOSIS — M545 Low back pain: Secondary | ICD-10-CM | POA: Diagnosis not present

## 2018-06-12 NOTE — Progress Notes (Signed)
Office Visit Note   Patient: Linda Copeland           Date of Birth: July 09, 1931           MRN: [id number] Visit Date: 06/12/2018 Requested by: Gaynelle Arabian, MD [address] [place], [place] [id number] PCP: Gaynelle Arabian, MD  Subjective: Chief Complaint  Patient presents with  . Lower Back - Pain    Low back pain x 3 weeks. I "occasionally feel pain under my belly."    HPI: She is an 84 year old seen at the request of Dr. Marisue Humble for low back pain.  Symptoms started about 3 weeks ago, no injury.  She woke up one day feeling midline lumbar pain and it has not stopped hurting since then.  The pain sometimes radiates into both groin areas.  It hurts constantly, but is worse when standing.  She gets a little bit of relief when she lies down at night.  She is using a topical cream with temporary improvement.  She went to the emergency room August 28 and had x-rays done showing compression deformity at L3 with similar appearance to CT scan from 2018.  Urine studies were unremarkable.  She denies any numbness or weakness in her legs.  She is having some diarrhea today but ordinarily struggles with constipation.  Denies fevers or chills.              ROS: She has hypothyroidism well controlled.  She has well-controlled hypertension as well.  She has glaucoma treated with drops.  All other systems were negative.  Objective: Vital Signs: There were no vitals taken for this visit.  Physical Exam:  Back: No visible rash.  She is point tender in the midline at around L3-4 area.  No pain over the sacroiliac joints or in the sciatic notch.  No pain with passive internal hip rotation.  Straight leg raise negative, lower extremity strength and reflexes are normal.  Imaging: No new imaging today.  Assessment & Plan: 1.  Low back pain, possibilities include new compression fracture, disc protrusion with foraminal stenosis, or possibly muscular strain. -Discussed options with patient and  elected to proceed with MRI scan.  If compression fracture is confirmed and is subacute, then possibly referral for vertebroplasty.  If no compression fracture, she might be a candidate for epidural steroid injection.  Physical therapy would be another consideration.   Follow-Up Instructions: No follow-ups on file.     Procedures: None today.   PMFS History: Patient Active Problem List   Diagnosis Date Noted  . Closed fracture of nasal bone with routine healing 10/11/2016  . Subdural hematoma ([facility]) 10/05/2016   Past Medical History:  Diagnosis Date  . Arthritis    bilateral severe facet L4-L5, L5-S1  . Carpal tunnel syndrome of right wrist   . Chronic cervical radiculopathy    C5  . Compression fracture of lumbar vertebra ([facility])    L2-L3  . Environmental and seasonal allergies   . Frequent urination   . Hematoma of frontal scalp 10/06/2016  . History of bronchitis   . History of humerus fracture 10/06/2016   Right proximal  . History of subdural hematoma 10/06/2016   Left falcotentorial   . HOH (hard of hearing)   . Hypertension   . Hypothyroidism   . Nasal fracture 10/06/2016  . OA (osteoarthritis)    AC glenohumeral joinr  . Osteopenia   . Spinal stenosis at L4-L5 level     History reviewed.  No pertinent family history.  Past Surgical History:  Procedure Laterality Date  . CARPAL TUNNEL RELEASE Right 01/22/2018   Procedure: RIGHT CARPAL TUNNEL RELEASE;  Surgeon: Daryll Brod, MD;  Location: [facility];  Service: Orthopedics;  Laterality: Right;  . COLONOSCOPY     Social History   Occupational History  . Not on file  Tobacco Use  . Smoking status: Never Smoker  . Smokeless tobacco: Never Used  Substance and Sexual Activity  . Alcohol use: Yes    Comment: rare   . Drug use: Never  . Sexual activity: Not on file

## 2018-06-20 ENCOUNTER — Ambulatory Visit
Admission: RE | Admit: 2018-06-20 | Discharge: 2018-06-20 | Disposition: A | Discharge status: Home / Self Care | Payer: Medicare HMO | Source: Ambulatory Visit | Attending: Family Medicine | Admitting: Family Medicine

## 2018-06-20 DIAGNOSIS — M545 Low back pain: Secondary | ICD-10-CM | POA: Diagnosis not present

## 2018-06-21 ENCOUNTER — Telehealth (INDEPENDENT_AMBULATORY_CARE_PROVIDER_SITE_OTHER): Payer: Self-pay | Admitting: Family Medicine

## 2018-06-21 DIAGNOSIS — S32010G Wedge compression fracture of first lumbar vertebra, subsequent encounter for fracture with delayed healing: Secondary | ICD-10-CM

## 2018-06-21 NOTE — Telephone Encounter (Signed)
MRI shows chronic L3 compression fracture, but a new compression fracture at L1 and subacute at L2.  We will refer her for consultation regarding vertebroplasty.

## 2018-06-21 NOTE — Telephone Encounter (Signed)
Patient advised of MRI results.  She will await a call from [employer].

## 2018-06-24 ENCOUNTER — Telehealth (INDEPENDENT_AMBULATORY_CARE_PROVIDER_SITE_OTHER): Payer: Self-pay | Admitting: Family Medicine

## 2018-06-24 NOTE — Telephone Encounter (Signed)
Patient's son called wanting to know if Dr. Junius Roads will go over the results of the MRI or does she have to wait to see Dr. Lynann Bologna at [facility].  Please advise son.  CB#[phone].  Thank you.

## 2018-06-25 DIAGNOSIS — M545 Low back pain: Secondary | ICD-10-CM | POA: Diagnosis not present

## 2018-06-25 NOTE — Telephone Encounter (Signed)
I called and spoke to him.  She already saw Dr. Lynann Bologna today and received treatment.

## 2018-06-29 ENCOUNTER — Encounter (HOSPITAL_COMMUNITY): Payer: Self-pay | Admitting: Emergency Medicine

## 2018-06-29 ENCOUNTER — Emergency Department (HOSPITAL_COMMUNITY)
Admission: EM | Admit: 2018-06-29 | Discharge: 2018-06-29 | Disposition: A | Discharge status: Home / Self Care | Payer: Medicare HMO | Attending: Emergency Medicine | Admitting: Emergency Medicine

## 2018-06-29 ENCOUNTER — Other Ambulatory Visit: Payer: Self-pay

## 2018-06-29 ENCOUNTER — Emergency Department (HOSPITAL_COMMUNITY): Payer: Medicare HMO

## 2018-06-29 DIAGNOSIS — R58 Hemorrhage, not elsewhere classified: Secondary | ICD-10-CM | POA: Diagnosis not present

## 2018-06-29 DIAGNOSIS — E039 Hypothyroidism, unspecified: Secondary | ICD-10-CM | POA: Diagnosis not present

## 2018-06-29 DIAGNOSIS — Y999 Unspecified external cause status: Secondary | ICD-10-CM | POA: Diagnosis not present

## 2018-06-29 DIAGNOSIS — S0990XA Unspecified injury of head, initial encounter: Secondary | ICD-10-CM | POA: Diagnosis not present

## 2018-06-29 DIAGNOSIS — I10 Essential (primary) hypertension: Secondary | ICD-10-CM | POA: Insufficient documentation

## 2018-06-29 DIAGNOSIS — S0181XA Laceration without foreign body of other part of head, initial encounter: Secondary | ICD-10-CM | POA: Diagnosis not present

## 2018-06-29 DIAGNOSIS — Z79899 Other long term (current) drug therapy: Secondary | ICD-10-CM | POA: Diagnosis not present

## 2018-06-29 DIAGNOSIS — S0101XA Laceration without foreign body of scalp, initial encounter: Secondary | ICD-10-CM | POA: Diagnosis not present

## 2018-06-29 DIAGNOSIS — Y9389 Activity, other specified: Secondary | ICD-10-CM | POA: Insufficient documentation

## 2018-06-29 DIAGNOSIS — Y92009 Unspecified place in unspecified non-institutional (private) residence as the place of occurrence of the external cause: Secondary | ICD-10-CM | POA: Insufficient documentation

## 2018-06-29 DIAGNOSIS — R0902 Hypoxemia: Secondary | ICD-10-CM | POA: Diagnosis not present

## 2018-06-29 DIAGNOSIS — W010XXA Fall on same level from slipping, tripping and stumbling without subsequent striking against object, initial encounter: Secondary | ICD-10-CM | POA: Insufficient documentation

## 2018-06-29 DIAGNOSIS — M542 Cervicalgia: Secondary | ICD-10-CM | POA: Diagnosis not present

## 2018-06-29 DIAGNOSIS — W19XXXA Unspecified fall, initial encounter: Secondary | ICD-10-CM | POA: Diagnosis not present

## 2018-06-29 DIAGNOSIS — S199XXA Unspecified injury of neck, initial encounter: Secondary | ICD-10-CM | POA: Diagnosis not present

## 2018-06-29 MED ORDER — LIDOCAINE-PRILOCAINE 2.5-2.5 % EX CREA
TOPICAL_CREAM | Freq: Once | CUTANEOUS | Status: DC
Start: 2018-06-29 — End: 2018-06-29
  Filled 2018-06-29: qty 5

## 2018-06-29 NOTE — ED Provider Notes (Signed)
[facility] EMERGENCY DEPARTMENT Provider Note   CSN: [id number] Arrival date & time: 06/29/18  1151   History   Chief Complaint Chief Complaint  Patient presents with  . Fall  . Head Laceration    HPI Linda Copeland is a 84 y.o. female with a past medical history significant for chronic cervical, compression fracture of lumbar spine radiculopathy, HTN who presents for evaluation after fall. Patient states she was sitting on her chair when the phone started ringing. She stood up to get to there phone on the otherside of the room and she states her left foot got caught in the carpet and her walker causing her to fall to the left and backwards. Denies LOC, fever, dizziness, lightheadedness, weakness, visual changes, chest pain, SOB, abdominal pain, decreased ROM in her extremities or back pain. Admits to a headache which he describes as a dull ache over the area where she fell.  Admits to mild neck tenderness.  She rates her headache and neck pain a 2/10.  Denies radiation of pain.  States as soon as she fell she noticed that she had started to bleed and she applied pressure with a towel.  She states she had a tetanus shot last year.  HPI  Past Medical History:  Diagnosis Date  . Arthritis    bilateral severe facet L4-L5, L5-S1  . Carpal tunnel syndrome of right wrist   . Chronic cervical radiculopathy    C5  . Compression fracture of lumbar vertebra ([facility])    L2-L3  . Environmental and seasonal allergies   . Frequent urination   . Hematoma of frontal scalp 10/06/2016  . History of bronchitis   . History of humerus fracture 10/06/2016   Right proximal  . History of subdural hematoma 10/06/2016   Left falcotentorial   . HOH (hard of hearing)   . Hypertension   . Hypothyroidism   . Nasal fracture 10/06/2016  . OA (osteoarthritis)    AC glenohumeral joinr  . Osteopenia   . Spinal stenosis at L4-L5 level     Patient Active Problem List   Diagnosis Date Noted    . Closed fracture of nasal bone with routine healing 10/11/2016  . Subdural hematoma ([facility]) 10/05/2016    Past Surgical History:  Procedure Laterality Date  . CARPAL TUNNEL RELEASE Right 01/22/2018   Procedure: RIGHT CARPAL TUNNEL RELEASE;  Surgeon: Daryll Brod, MD;  Location: [facility];  Service: Orthopedics;  Laterality: Right;  . COLONOSCOPY       OB History   None      Home Medications    Prior to Admission medications   Medication Sig Start Date End Date Taking? Authorizing Provider  acetaminophen (TYLENOL) 325 MG tablet Take 2 tablets (650 mg total) by mouth every 6 (six) hours as needed for mild pain. Patient not taking: Reported on 05/29/2018 10/07/16   Jill Alexanders, PA-C  bimatoprost (LUMIGAN) 0.03 % ophthalmic solution Place 1 drop into both eyes 2 (two) times daily.     [provider]  dorzolamide-timolol (COSOPT) 22.3-6.8 MG/ML ophthalmic solution Place 1 drop into the right eye 2 (two) times daily. 09/09/16   [provider]  levothyroxine (SYNTHROID, LEVOTHROID) 100 MCG tablet Take 100 mcg by mouth daily before breakfast. 09/09/16   [provider]  lidocaine (LIDODERM) 5 % Place 1 patch onto the skin daily. Remove & Discard patch within 12 hours or as directed by MD 05/29/18   Franchot Heidelberg, PA-C  lisinopril (PRINIVIL,ZESTRIL) 20 MG tablet Take 20 mg by mouth daily. 09/09/16   [provider]  LUMIGAN 0.01 % SOLN Place 1 drop into both eyes at bedtime.  03/18/18   [provider]  methocarbamol (ROBAXIN) 500 MG tablet Take 1 tablet (500 mg total) by mouth at bedtime as needed for muscle spasms. 05/29/18   Caccavale, Sophia, PA-C  naproxen sodium (ALEVE) 220 MG tablet Take 220 mg by mouth 2 (two) times daily as needed (pain).    [provider]  traMADol (ULTRAM) 50 MG tablet Take 1 tablet (50 mg total) by mouth every 6 (six) hours as needed. Patient not taking: Reported on 05/29/2018 01/22/18  01/22/19  Daryll Brod, MD    Family History No family history on file.  Social History Social History   Tobacco Use  . Smoking status: Never Smoker  . Smokeless tobacco: Never Used  Substance Use Topics  . Alcohol use: Yes    Comment: rare   . Drug use: Never     Allergies   Codeine; Biaxin [clarithromycin]; and Caffeine   Review of Systems Review of Systems  Constitutional: Negative for activity change, appetite change, chills, diaphoresis, fatigue and unexpected weight change.  Eyes: Negative for visual disturbance.  Respiratory: Negative.   Cardiovascular: Negative.   Gastrointestinal: Negative for abdominal distention, abdominal pain, nausea and vomiting.  Genitourinary: Negative for difficulty urinating, flank pain and pelvic pain.  Musculoskeletal: Positive for neck pain. Negative for back pain, joint swelling and neck stiffness.  Skin: Positive for wound. Negative for pallor.  Neurological: Positive for headaches. Negative for dizziness, syncope, facial asymmetry, speech difficulty, weakness, light-headedness and numbness.     Physical Exam Updated Vital Signs BP (!) 147/75   Pulse 86   Temp 99.3 F (37.4 C) (Oral)   Resp 13   SpO2 94%   Physical Exam  Physical Exam  Constitutional: Pt is oriented to person, place, and time. Pt appears well-developed and well-nourished. No distress.  HENT:  Head: Normocephalic. 1cm superficial laceration to the posterior left head. Area without foreign body. Mild tenderness to palpation over area. No hematoma, ecchymosis. Mouth/Throat: Oropharynx is clear and moist.  Eyes: Conjunctivae and EOM are normal. Pupils are equal, round, and reactive to light. No scleral icterus.  No horizontal, vertical or rotational nystagmus  Neck: Normal range of motion. Neck supple.  Full active and passive ROM without pain No paraspinal tenderness Mild tenderness over midline. No nuchal rigidity or meningeal signs  Cardiovascular:  Normal rate, regular rhythm and intact distal pulses.   Pulmonary/Chest: Effort normal and breath sounds normal. No respiratory distress. Pt has no wheezes. No rales.  Abdominal: Soft. Bowel sounds are normal. There is no tenderness. There is no rebound and no guarding. No ecchymosis. Musculoskeletal: Normal range of motion. Able to move all extremities without difficulty with flexion, extension, abduction and adduction. No tenderness to palpation over the shoulders, clavicle, chest wall, pelvis, hips, knees or midline spine. Lymphadenopathy:    No cervical adenopathy.  Neurological: Pt. is alert and oriented to person, place, and time. He has normal reflexes. No cranial nerve deficit.  Exhibits normal muscle tone. Coordination normal.  Mental Status:  Alert, oriented, thought content appropriate. Speech fluent without evidence of aphasia. Able to follow 2 step commands without difficulty.  Cranial Nerves:  II:  Peripheral visual fields grossly normal, pupils equal, round, reactive to light III,IV, VI: ptosis not present, extra-ocular motions intact bilaterally  V,VII: smile symmetric, facial light touch sensation  equal VIII: hearing grossly normal bilaterally  IX,X: midline uvula rise  XI: bilateral shoulder shrug equal and strong XII: midline tongue extension  Motor:  5/5 in upper and lower extremities bilaterally including strong and equal grip strength and dorsiflexion/plantar flexion Sensory: Pinprick and light touch normal in all extremities.  Deep Tendon Reflexes: 2+ and symmetric  Cerebellar: normal finger-to-nose with bilateral upper extremities Gait: normal gait and balance CV: distal pulses palpable throughout   Skin: Skin is warm and dry. No rash noted. Pt is not diaphoretic.  Psychiatric: Pt has a normal mood and affect. Behavior is normal. Judgment and thought content normal.  Nursing note and vitals reviewed. ED Treatments / Results  Labs (all labs ordered are listed, but  only abnormal results are displayed) Labs Reviewed - No data to display  EKG EKG Interpretation  Date/Time:  Saturday June 29 2018 12:09:45 EDT Ventricular Rate:  85 PR Interval:    QRS Duration: 122 QT Interval:  371 QTC Calculation: 442 R Axis:   11 Text Interpretation:  Sinus rhythm Right bundle branch block Confirmed by Virgel Manifold [phone]) on 06/29/2018 2:08:52 PM   Radiology Ct Head Wo Contrast  Result Date: 06/29/2018 CLINICAL DATA:  Fall with head injury EXAM: CT HEAD WITHOUT CONTRAST CT CERVICAL SPINE WITHOUT CONTRAST TECHNIQUE: Multidetector CT imaging of the head and cervical spine was performed following the standard protocol without intravenous contrast. Multiplanar CT image reconstructions of the cervical spine were also generated. COMPARISON:  10/06/2016 FINDINGS: CT HEAD FINDINGS Brain: Global atrophy. Chronic ischemic changes in the periventricular white matter. No mass effect, midline shift, or acute hemorrhage. Vascular: No hyperdense vessel or unexpected calcification. Skull: Intact. Sinuses/Orbits: Minimal fluid in the right mastoid air cells. There is cerumen within the right external auditory canal. Visualized paranasal sinuses are clear. Orbits are within normal limits. Other: Noncontributory. CT CERVICAL SPINE FINDINGS Alignment: Slight anterolisthesis C7 upon T1 is related to facet arthropathy. Skull base and vertebrae: No acute fracture.  No dislocation. Soft tissues and spinal canal: No obvious spinal hematoma. No evidence of prevertebral edema. Atherosclerotic calcifications are noted. Calcified lymph node in the right cervical chain is noted. Disc levels: There are degenerative changes at the C1-C2 articulation and posterior odontoid pannus formation. There is scattered facet arthropathy throughout the cervical spine. Multilevel degenerative disc disease is present. Auto fusion of C5-C6 is noted. Upper chest: Negative. Other: Noncontributory. IMPRESSION: No  acute intracranial pathology. Chronic ischemic changes and atrophy are noted. No evidence of cervical spine injury.  Chronic changes are noted. Electronically Signed   By: Marybelle Killings M.D.   On: 06/29/2018 14:08   Ct Cervical Spine Wo Contrast  Result Date: 06/29/2018 CLINICAL DATA:  Fall with head injury EXAM: CT HEAD WITHOUT CONTRAST CT CERVICAL SPINE WITHOUT CONTRAST TECHNIQUE: Multidetector CT imaging of the head and cervical spine was performed following the standard protocol without intravenous contrast. Multiplanar CT image reconstructions of the cervical spine were also generated. COMPARISON:  10/06/2016 FINDINGS: CT HEAD FINDINGS Brain: Global atrophy. Chronic ischemic changes in the periventricular white matter. No mass effect, midline shift, or acute hemorrhage. Vascular: No hyperdense vessel or unexpected calcification. Skull: Intact. Sinuses/Orbits: Minimal fluid in the right mastoid air cells. There is cerumen within the right external auditory canal. Visualized paranasal sinuses are clear. Orbits are within normal limits. Other: Noncontributory. CT CERVICAL SPINE FINDINGS Alignment: Slight anterolisthesis C7 upon T1 is related to facet arthropathy. Skull base and vertebrae: No acute fracture.  No dislocation. Soft tissues and  spinal canal: No obvious spinal hematoma. No evidence of prevertebral edema. Atherosclerotic calcifications are noted. Calcified lymph node in the right cervical chain is noted. Disc levels: There are degenerative changes at the C1-C2 articulation and posterior odontoid pannus formation. There is scattered facet arthropathy throughout the cervical spine. Multilevel degenerative disc disease is present. Auto fusion of C5-C6 is noted. Upper chest: Negative. Other: Noncontributory. IMPRESSION: No acute intracranial pathology. Chronic ischemic changes and atrophy are noted. No evidence of cervical spine injury.  Chronic changes are noted. Electronically Signed   By: Marybelle Killings  M.D.   On: 06/29/2018 14:08    Procedures .Marland KitchenLaceration Repair Date/Time: 06/29/2018 3:00 PM Performed by: Nettie Elm, PA-C Authorized by: Nettie Elm, PA-C   Consent:    Consent obtained:  Verbal   Consent given by:  Patient   Risks discussed:  Infection, need for additional repair, pain, poor cosmetic result and poor wound healing   Alternatives discussed:  No treatment and delayed treatment Universal protocol:    Procedure explained and questions answered to patient or proxy's satisfaction: yes     Relevant documents present and verified: yes     Test results available and properly labeled: yes     Imaging studies available: yes     Required blood products, implants, devices, and special equipment available: yes     Site/side marked: yes     Immediately prior to procedure, a time out was called: yes     Patient identity confirmed:  Verbally with patient Anesthesia (see MAR for exact dosages):    Anesthesia method:  None Laceration details:    Location:  Scalp   Scalp location:  L parietal   Length (cm):  0.8 Pre-procedure details:    Preparation:  Patient was prepped and draped in usual sterile fashion and imaging obtained to evaluate for foreign bodies Exploration:    Contaminated: no   Treatment:    Area cleansed with:  Saline   Amount of cleaning:  Extensive   Irrigation solution:  Sterile saline Skin repair:    Repair method:  Tissue adhesive Approximation:    Approximation:  Close Post-procedure details:    Dressing:  Open (no dressing)   Patient tolerance of procedure:  Tolerated well, no immediate complications Comments:     Hair apposition with dermabond   (including critical care time)  Medications Ordered in ED Medications  lidocaine-prilocaine (EMLA) cream (has no administration in time range)     Initial Impression / Assessment and Plan / ED Course  I have reviewed the triage vital signs and the nursing notes as well as past medical  history.  Pertinent labs & imaging results that were available during my care of the patient were reviewed by me and considered in my medical decision making (see chart for details).   84 year old otherwise healthy female presents for evaluation after mechanical fall.  No dizziness, chest pain, shortness of breath, lightheadedness or blurred vision when she fell.  Mild headache over posterior left scalp where there is a laceration.  Mild midline tenderness, which patient describes as a twinge to her neck.  Nonfocal neuro exam without neuro deficits.  Patient is able to move all extremities without difficulty.  No midline spine tenderness or tenderness to pelvis or hips.  Low suspicion for fracture of extremities.  Patient does have hx of compression fractures in her lumbar spine which is followed by neurosurgery.  No pain at this time. No bowel or bladder incontinence,no saddle paresthesias.  Patient unaware if she is on any blood thinners at this time.  Does not look like it from her medicine records, however pharmacy has not been by to update this.  Will CT head and neck as she does have some tenderness in this area.  We will plan for staple of her head laceration if her scans are negative.  Patient does not want anything for pain at this time.  If scans are negative we will plan for DC home and follow-up with primary care for reevaluation.  CT scan head and neck negative.  Superficial approximately 8 mm laceration to the left parietal region of scalp. Hair apposition technique used for closure. Patient tolerated without complaints. Tetanus up to date.  Patient is stable for dc home. Discussed with patient and family reasons to return to the ED. Patient and family voice understanding and is agreeable for follow-up.  Patient was seen and evaluated by my attending Dr. Wilson Singer who agrees with the treatment, pland and disposition of patient.    Final Clinical Impressions(s) / ED Diagnoses   Final  diagnoses:  Fall, initial encounter  Laceration of scalp without foreign body, initial encounter    ED Discharge Orders    None       Dariusz Brase A, PA-C 06/29/18 [id number]    Virgel Manifold, MD 07/01/18 1359

## 2018-06-29 NOTE — ED Notes (Signed)
ED Provider at bedside. 

## 2018-06-29 NOTE — Discharge Instructions (Addendum)
You were seen today for a fall.  Your head CT and neck CT were negative for fracture, or bleeding.  I have placed some glue on the back of your head. Please do not scrub this area for a week. Please follow up with your primary care provider in 3 days for a follow-up.  Please return to the ED with any new or worsening symptoms such as: Headache unresolved with Tylenol, headache with vision changes, fever, chills, nausea, vomiting, severe neck pain, sever back pain, numbness, tingling in lower extremities, bowel or bladder incontinence, saddle paresthesias

## 2018-06-29 NOTE — ED Provider Notes (Signed)
Medical screening examination/treatment/procedure(s) were conducted as a shared visit with non-physician practitioner(s) and myself.  I personally evaluated the patient during the encounter.  EKG Interpretation  Date/Time:  Saturday June 29 2018 12:09:45 EDT Ventricular Rate:  85 PR Interval:    QRS Duration: 122 QT Interval:  371 QTC Calculation: 442 R Axis:   11 Text Interpretation:  Sinus rhythm Right bundle branch block Confirmed by Virgel Manifold [phone]) on 06/29/2018 2:08:52 PM  87yF with mechanical fall. Small posterior head lac. Nonfocal neuro exam. Reassuring imaging. Lac repaired.    Virgel Manifold, MD 06/29/18 1526

## 2018-06-29 NOTE — ED Notes (Signed)
Pt getting dressed.

## 2018-06-29 NOTE — ED Triage Notes (Signed)
Pt with [facility] had a fall this morning when attempting to get to the phone. She uses a walker normally. She presents with a 2 inch lac to the left side of her head. Denies LOC or blood thinners. Pt alert. Denies pain.

## 2018-06-29 NOTE — ED Notes (Signed)
Patient transported to CT 

## 2018-07-09 DIAGNOSIS — M545 Low back pain: Secondary | ICD-10-CM | POA: Diagnosis not present

## 2018-07-09 DIAGNOSIS — M542 Cervicalgia: Secondary | ICD-10-CM | POA: Diagnosis not present

## 2018-07-27 ENCOUNTER — Encounter (HOSPITAL_COMMUNITY): Payer: Self-pay | Admitting: Emergency Medicine

## 2018-07-27 ENCOUNTER — Other Ambulatory Visit: Payer: Self-pay

## 2018-07-27 ENCOUNTER — Emergency Department (HOSPITAL_COMMUNITY): Payer: Medicare HMO

## 2018-07-27 ENCOUNTER — Emergency Department (HOSPITAL_COMMUNITY)
Admission: EM | Admit: 2018-07-27 | Discharge: 2018-07-27 | Disposition: A | Discharge status: Home / Self Care | Payer: Medicare HMO | Attending: Emergency Medicine | Admitting: Emergency Medicine

## 2018-07-27 DIAGNOSIS — S0211GA Other fracture of occiput, right side, initial encounter for closed fracture: Secondary | ICD-10-CM | POA: Diagnosis not present

## 2018-07-27 DIAGNOSIS — R42 Dizziness and giddiness: Secondary | ICD-10-CM | POA: Diagnosis not present

## 2018-07-27 DIAGNOSIS — Y939 Activity, unspecified: Secondary | ICD-10-CM | POA: Insufficient documentation

## 2018-07-27 DIAGNOSIS — Y999 Unspecified external cause status: Secondary | ICD-10-CM | POA: Diagnosis not present

## 2018-07-27 DIAGNOSIS — Z79899 Other long term (current) drug therapy: Secondary | ICD-10-CM | POA: Diagnosis not present

## 2018-07-27 DIAGNOSIS — S0990XA Unspecified injury of head, initial encounter: Secondary | ICD-10-CM

## 2018-07-27 DIAGNOSIS — Y92008 Other place in unspecified non-institutional (private) residence as the place of occurrence of the external cause: Secondary | ICD-10-CM | POA: Insufficient documentation

## 2018-07-27 DIAGNOSIS — W19XXXA Unspecified fall, initial encounter: Secondary | ICD-10-CM

## 2018-07-27 DIAGNOSIS — S199XXA Unspecified injury of neck, initial encounter: Secondary | ICD-10-CM | POA: Diagnosis not present

## 2018-07-27 DIAGNOSIS — W0110XA Fall on same level from slipping, tripping and stumbling with subsequent striking against unspecified object, initial encounter: Secondary | ICD-10-CM | POA: Diagnosis not present

## 2018-07-27 NOTE — ED Provider Notes (Signed)
[facility] EMERGENCY DEPARTMENT Provider Note   CSN: [id number] Arrival date & time: 07/27/18  1530     History   Chief Complaint Chief Complaint  Patient presents with  . Fall    HPI Linda Copeland is a 84 y.o. female with chronic cervical compression fracture of lumbar spine, HTN who presents for evaluation after a fall.  The fall was witnessed by her son.  Other family is at bedside who adds to the history.  Patient states she was standing on her front steps waiting to get inside and lost her balance and fell backwards.  She denies prodromal chest pain, shortness of breath, dizziness, vision changes, abdominal pain.  She hit her ahead on the concrete.  Her son helped her up.  After she got inside and sat down, she had one episode of dizziness.  She denies loss of consciousness, bowel or bladder incontinence.  She has chronic back pain from her compression fractures and denies worsening pain.  Currently she endorses a headache, 3 out of 10, located at the back of her head.  She also endorses mild neck pain.  Denies pain radiation.  HPI  Past Medical History:  Diagnosis Date  . Arthritis    bilateral severe facet L4-L5, L5-S1  . Carpal tunnel syndrome of right wrist   . Chronic cervical radiculopathy    C5  . Compression fracture of lumbar vertebra (HCC)    L2-L3  . Environmental and seasonal allergies   . Frequent urination   . Hematoma of frontal scalp 10/06/2016  . History of bronchitis   . History of humerus fracture 10/06/2016   Right proximal  . History of subdural hematoma 10/06/2016   Left falcotentorial   . HOH (hard of hearing)   . Hypertension   . Hypothyroidism   . Nasal fracture 10/06/2016  . OA (osteoarthritis)    AC glenohumeral joinr  . Osteopenia   . Spinal stenosis at L4-L5 level     Patient Active Problem List   Diagnosis Date Noted  . Closed fracture of nasal bone with routine healing 10/11/2016  . Subdural hematoma ([place])  10/05/2016    Past Surgical History:  Procedure Laterality Date  . CARPAL TUNNEL RELEASE Right 01/22/2018   Procedure: RIGHT CARPAL TUNNEL RELEASE;  Surgeon: Daryll Brod, MD;  Location: [facility];  Service: Orthopedics;  Laterality: Right;  . COLONOSCOPY       OB History   None      Home Medications    Prior to Admission medications   Medication Sig Start Date End Date Taking? Authorizing Provider  acetaminophen (TYLENOL) 325 MG tablet Take 2 tablets (650 mg total) by mouth every 6 (six) hours as needed for mild pain. Patient not taking: Reported on 05/29/2018 10/07/16   Jill Alexanders, PA-C  bimatoprost (LUMIGAN) 0.03 % ophthalmic solution Place 1 drop into both eyes 2 (two) times daily.     [provider]  dorzolamide-timolol (COSOPT) 22.3-6.8 MG/ML ophthalmic solution Place 1 drop into the right eye 2 (two) times daily. 09/09/16   [provider]  levothyroxine (SYNTHROID, LEVOTHROID) 100 MCG tablet Take 100 mcg by mouth daily before breakfast. 09/09/16   [provider]  lidocaine (LIDODERM) 5 % Place 1 patch onto the skin daily. Remove & Discard patch within 12 hours or as directed by MD 05/29/18   Caccavale, Sophia, PA-C  lisinopril (PRINIVIL,ZESTRIL) 20 MG tablet Take 20 mg by mouth daily. 09/09/16   [provider]  LUMIGAN 0.01 % SOLN Place 1 drop into both eyes at bedtime.  03/18/18   [provider]  methocarbamol (ROBAXIN) 500 MG tablet Take 1 tablet (500 mg total) by mouth at bedtime as needed for muscle spasms. 05/29/18   Caccavale, Sophia, PA-C  naproxen sodium (ALEVE) 220 MG tablet Take 220 mg by mouth 2 (two) times daily as needed (pain).    [provider]  traMADol (ULTRAM) 50 MG tablet Take 1 tablet (50 mg total) by mouth every 6 (six) hours as needed. Patient not taking: Reported on 05/29/2018 01/22/18 01/22/19  Daryll Brod, MD    Family History No family history on file.  Social History Social  History   Tobacco Use  . Smoking status: Never Smoker  . Smokeless tobacco: Never Used  Substance Use Topics  . Alcohol use: Yes    Comment: rare   . Drug use: Never     Allergies   Codeine; Biaxin [clarithromycin]; and Caffeine   Review of Systems Review of Systems  Constitutional: Negative for activity change, chills and fever.  Respiratory: Negative for cough and shortness of breath.   Cardiovascular: Negative for chest pain.  Gastrointestinal: Negative for abdominal pain, diarrhea, nausea and vomiting.  Genitourinary: Positive for urgency. Negative for dysuria.  Musculoskeletal: Positive for back pain.  Neurological: Positive for headaches. Negative for dizziness and syncope.     Physical Exam Updated Vital Signs BP (!) 149/65   Pulse 76   Temp 97.8 F (36.6 C) (Oral)   Resp (!) 22   SpO2 97%   Physical Exam Vitals:   07/27/18 1630 07/27/18 1700 07/27/18 1745 07/27/18 1800  BP: 136/67 131/60 (!) 144/68 (!) 149/65  Pulse: 78 76 76 76  Resp: 17 20 18  (!) 22  Temp:      TempSrc:      SpO2: 98% 97% 98% 97%   General: Vital signs reviewed.  Patient is well-developed and well-nourished, in no acute distress and cooperative with exam.  Head: 3x3cm hematoma to posterior scalp with swelling and blood. Dried blood matting the hair. No evidence of laceration. Eyes: EOMI, conjunctivae normal, no scleral icterus. PERRL Neck: Supple, trachea midline, normal ROM Cardiovascular: RRR Pulmonary/Chest: Clear to auscultation bilaterally Abdominal: Soft, non-tender, non-distended, BS + Musculoskeletal: Small abrasion to right elbow, other joints are without deformity, erythema, or stiffness, ROM full and nontender. Extremities: No lower extremity edema bilaterally,  pulses symmetric and intact bilaterally.  Neurological: A&O x3, Strength is normal and symmetric bilaterally, cranial nerve II-XII are grossly intact, no focal motor deficit, sensory intact to light touch  bilaterally.  Psychiatric: Normal mood and affect. speech and behavior is normal. Cognition and memory are normal.    ED Treatments / Results  Labs (all labs ordered are listed, but only abnormal results are displayed) Labs Reviewed - No data to display  EKG EKG Interpretation  Date/Time:  Saturday July 27 2018 15:35:13 EDT Ventricular Rate:  80 PR Interval:  154 QRS Duration: 116 QT Interval:  370 QTC Calculation: 426 R Axis:   5 Text Interpretation:  Normal sinus rhythm Incomplete right bundle branch block Possible Lateral infarct , age undetermined Inferior infarct , age undetermined Abnormal ECG No significant change since last tracing Confirmed by Wandra Arthurs [id number]) on 07/27/2018 4:34:49 PM   Radiology Ct Head Wo Contrast  Result Date: 07/27/2018 CLINICAL DATA:  Large posterior scalp hematoma after falling and hitting her head today. Taking blood thinners. EXAM: CT HEAD WITHOUT CONTRAST CT  CERVICAL SPINE WITHOUT CONTRAST TECHNIQUE: Multidetector CT imaging of the head and cervical spine was performed following the standard protocol without intravenous contrast. Multiplanar CT image reconstructions of the cervical spine were also generated. COMPARISON:  06/29/2018. FINDINGS: CT HEAD FINDINGS Brain: Diffusely enlarged ventricles and subarachnoid spaces. Patchy white matter low density in both cerebral hemispheres. Old bilateral basal ganglia/internal capsule lacunar infarcts. No intracranial hemorrhage, mass lesion or CT evidence of acute infarction. Vascular: No hyperdense vessel or unexpected calcification. Skull: Nondisplaced right occipital bone fracture. Bilateral hyperostosis frontalis. Sinuses/Orbits: Status post bilateral cataract extraction. Unremarkable paranasal sinuses. Other: Moderately large right posterolateral scalp hematoma and associated laceration with soft tissue air. CT CERVICAL SPINE FINDINGS Alignment: Stable mild anterolisthesis at the C6-7 and C7-T1 levels.  Skull base and vertebrae: Previously described right occipital bone fracture. No cervical spine fractures seen. Soft tissues and spinal canal: No prevertebral fluid or swelling. No visible canal hematoma. Disc levels:  Multilevel degenerative changes. Upper chest: Clear lung apices. Other: Bilateral carotid artery calcifications. Stable calcified lymph node in the neck on the right. IMPRESSION: 1. Nondisplaced right occipital bone fracture. 2. No intracranial hemorrhage. 3. No cervical spine fracture or traumatic subluxation. 4. Right posterior scalp hematoma and laceration. 5. Stable diffuse cerebral and cerebellar atrophy, chronic small vessel white matter ischemic changes and old lacunar infarcts. 6. Multilevel cervical spine degenerative changes. 7. Bilateral carotid artery atheromatous calcifications. Electronically Signed   By: Claudie Revering M.D.   On: 07/27/2018 18:04   Ct Cervical Spine Wo Contrast  Result Date: 07/27/2018 CLINICAL DATA:  Large posterior scalp hematoma after falling and hitting her head today. Taking blood thinners. EXAM: CT HEAD WITHOUT CONTRAST CT CERVICAL SPINE WITHOUT CONTRAST TECHNIQUE: Multidetector CT imaging of the head and cervical spine was performed following the standard protocol without intravenous contrast. Multiplanar CT image reconstructions of the cervical spine were also generated. COMPARISON:  06/29/2018. FINDINGS: CT HEAD FINDINGS Brain: Diffusely enlarged ventricles and subarachnoid spaces. Patchy white matter low density in both cerebral hemispheres. Old bilateral basal ganglia/internal capsule lacunar infarcts. No intracranial hemorrhage, mass lesion or CT evidence of acute infarction. Vascular: No hyperdense vessel or unexpected calcification. Skull: Nondisplaced right occipital bone fracture. Bilateral hyperostosis frontalis. Sinuses/Orbits: Status post bilateral cataract extraction. Unremarkable paranasal sinuses. Other: Moderately large right posterolateral  scalp hematoma and associated laceration with soft tissue air. CT CERVICAL SPINE FINDINGS Alignment: Stable mild anterolisthesis at the C6-7 and C7-T1 levels. Skull base and vertebrae: Previously described right occipital bone fracture. No cervical spine fractures seen. Soft tissues and spinal canal: No prevertebral fluid or swelling. No visible canal hematoma. Disc levels:  Multilevel degenerative changes. Upper chest: Clear lung apices. Other: Bilateral carotid artery calcifications. Stable calcified lymph node in the neck on the right. IMPRESSION: 1. Nondisplaced right occipital bone fracture. 2. No intracranial hemorrhage. 3. No cervical spine fracture or traumatic subluxation. 4. Right posterior scalp hematoma and laceration. 5. Stable diffuse cerebral and cerebellar atrophy, chronic small vessel white matter ischemic changes and old lacunar infarcts. 6. Multilevel cervical spine degenerative changes. 7. Bilateral carotid artery atheromatous calcifications. Electronically Signed   By: Claudie Revering M.D.   On: 07/27/2018 18:04    Procedures Procedures (including critical care time)  Medications Ordered in ED Medications - No data to display   Initial Impression / Assessment and Plan / ED Course  I have reviewed the triage vital signs and the nursing notes.  Pertinent labs & imaging results that were available during my care of the patient  were reviewed by me and considered in my medical decision making (see chart for details).     84 year old female with history of subdural hematoma and chronic compression fractures of the lumbar spine presents for evaluation after a mechanical fall.  She fell from her front porch steps, hitting the back of her head on concrete.  No loss of consciousness or concerning symptoms for syncope.  No chest pain, shortness of breath, abdominal pain.  EKG shows no new ischemic changes, RBBB consistent with prior EKG. Head CT showed small occipital fracture without  displacement. Cervical Ct was negative.  Wound was cleaned and no laceration was seen. Hemostasis achieved with wound seal powder.   Patient was monitored in the ED.  Vital signs remained stable.  She is alert and oriented x3.  She is safe to discharge. Given return precautions.   Final Clinical Impressions(s) / ED Diagnoses   Final diagnoses:  Fall, initial encounter  Injury of head, initial encounter    ED Discharge Orders    None       Isabelle Course, MD 07/27/18 Docia Chuck    Drenda Freeze, MD 07/28/18 [phone]

## 2018-07-27 NOTE — ED Triage Notes (Signed)
Pt presents with family after a fall backward today, denies LOC/dizziness, no blood thinner use. Pt A&O x 4. Verbal order CT head from [place], [place]

## 2018-07-27 NOTE — ED Notes (Signed)
purwick placed

## 2018-07-27 NOTE — Discharge Instructions (Addendum)
Your head and neck CT did not show any brain bleeds. It did show a small skull fracture but it is minor enough to heal on its own. We cleaned up your head wound and there was no cut to repair. You can take tylenol or ibuprofen for the pain. Your head will be sore for the next several days.   If you experience worsening pain, confusion, bleeding, difficulty walking please return to the emergency department immediately.

## 2018-07-27 NOTE — ED Notes (Addendum)
Family at bedside. 

## 2018-07-29 DIAGNOSIS — H401131 Primary open-angle glaucoma, bilateral, mild stage: Secondary | ICD-10-CM | POA: Diagnosis not present

## 2018-08-06 DIAGNOSIS — M546 Pain in thoracic spine: Secondary | ICD-10-CM | POA: Diagnosis not present

## 2018-08-06 DIAGNOSIS — M545 Low back pain: Secondary | ICD-10-CM | POA: Diagnosis not present

## 2018-08-13 ENCOUNTER — Ambulatory Visit: Payer: Medicare HMO | Admitting: Podiatry

## 2018-08-20 ENCOUNTER — Ambulatory Visit (INDEPENDENT_AMBULATORY_CARE_PROVIDER_SITE_OTHER): Payer: Medicare HMO | Admitting: Podiatry

## 2018-08-20 DIAGNOSIS — M79675 Pain in left toe(s): Secondary | ICD-10-CM | POA: Diagnosis not present

## 2018-08-20 DIAGNOSIS — B351 Tinea unguium: Secondary | ICD-10-CM | POA: Diagnosis not present

## 2018-08-20 DIAGNOSIS — M79674 Pain in right toe(s): Secondary | ICD-10-CM | POA: Diagnosis not present

## 2018-08-20 NOTE — Patient Instructions (Signed)

## 2018-08-22 DIAGNOSIS — E119 Type 2 diabetes mellitus without complications: Secondary | ICD-10-CM | POA: Diagnosis not present

## 2018-08-22 DIAGNOSIS — S32000A Wedge compression fracture of unspecified lumbar vertebra, initial encounter for closed fracture: Secondary | ICD-10-CM | POA: Diagnosis not present

## 2018-08-22 DIAGNOSIS — D692 Other nonthrombocytopenic purpura: Secondary | ICD-10-CM | POA: Diagnosis not present

## 2018-08-22 DIAGNOSIS — E039 Hypothyroidism, unspecified: Secondary | ICD-10-CM | POA: Diagnosis not present

## 2018-08-22 DIAGNOSIS — R350 Frequency of micturition: Secondary | ICD-10-CM | POA: Diagnosis not present

## 2018-08-22 DIAGNOSIS — W19XXXA Unspecified fall, initial encounter: Secondary | ICD-10-CM | POA: Diagnosis not present

## 2018-08-22 DIAGNOSIS — H409 Unspecified glaucoma: Secondary | ICD-10-CM | POA: Diagnosis not present

## 2018-08-22 DIAGNOSIS — I10 Essential (primary) hypertension: Secondary | ICD-10-CM | POA: Diagnosis not present

## 2018-08-22 DIAGNOSIS — J309 Allergic rhinitis, unspecified: Secondary | ICD-10-CM | POA: Diagnosis not present

## 2018-09-09 ENCOUNTER — Encounter: Payer: Self-pay | Admitting: Podiatry

## 2018-09-09 NOTE — Progress Notes (Signed)
Subjective: Linda Copeland presents today with painful, thick toenails 1-5 b/l that she cannot cut and which interfere with daily activities.  Pain is aggravated when wearing enclosed shoe gear and relieved with periodic professional debridement.  She voices no new problems on today's visit.   Current Outpatient Medications:  .  acetaminophen (TYLENOL) 325 MG tablet, Take 2 tablets (650 mg total) by mouth every 6 (six) hours as needed for mild pain., Disp: , Rfl:  .  bimatoprost (LUMIGAN) 0.03 % ophthalmic solution, Place 1 drop into both eyes 2 (two) times daily. , Disp: , Rfl:  .  dorzolamide-timolol (COSOPT) 22.3-6.8 MG/ML ophthalmic solution, Place 1 drop into the right eye 2 (two) times daily., Disp: , Rfl:  .  HYDROcodone-acetaminophen (NORCO/VICODIN) 5-325 MG tablet, TK 1 T PO Q 8 TO 12 H PRN P, Disp: , Rfl: 0 .  levothyroxine (SYNTHROID, LEVOTHROID) 100 MCG tablet, Take 100 mcg by mouth daily before breakfast., Disp: , Rfl:  .  lidocaine (LIDODERM) 5 %, Place 1 patch onto the skin daily. Remove & Discard patch within 12 hours or as directed by MD, Disp: 30 patch, Rfl: 0 .  lisinopril (PRINIVIL,ZESTRIL) 20 MG tablet, Take 20 mg by mouth daily., Disp: , Rfl:  .  LUMIGAN 0.01 % SOLN, Place 1 drop into both eyes at bedtime. , Disp: , Rfl: 0 .  methocarbamol (ROBAXIN) 500 MG tablet, Take 1 tablet (500 mg total) by mouth at bedtime as needed for muscle spasms., Disp: 5 tablet, Rfl: 0 .  naproxen sodium (ALEVE) 220 MG tablet, Take 220 mg by mouth 2 (two) times daily as needed (pain)., Disp: , Rfl:  .  traMADol (ULTRAM) 50 MG tablet, Take 1 tablet (50 mg total) by mouth every 6 (six) hours as needed., Disp: 15 tablet, Rfl: 0  Allergies  Allergen Reactions  . Codeine Nausea And Vomiting and Other (See Comments)    uknown  . Biaxin [Clarithromycin] Other (See Comments)    Sweats  . Caffeine     Shaky    Objective:  Vascular Examination: Capillary refill time immediate x 10  digits Dorsalis pedis 1/4 b/l  Posterior tibial pulses palpable 1/4  b/l Digital hair present x 10 digits Skin temperature gradient WNL b/l  Dermatological Examination: Skin with normal turgor, texture and tone b/l  Toenails 1-5 b/l discolored, thick, dystrophic with subungual debris and pain with palpation to nailbeds due to thickness of nails.  Musculoskeletal: Muscle strength 5/5 to all LE muscle groups  Neurological: Sensation intact with 10 gram monofilament. Vibratory sensation intact.  Assessment: Painful onychomycosis toenails 1-5 b/l   Plan: 1. Toenails 1-5 b/l were debrided in length and girth without iatrogenic bleeding. 2. Patient to continue soft, supportive shoe gear 3. Patient to report any pedal injuries to medical professional immediately. 4. Follow up 3 months. Patient/POA to call should there be a concern in the interim.

## 2018-11-06 DIAGNOSIS — M810 Age-related osteoporosis without current pathological fracture: Secondary | ICD-10-CM | POA: Diagnosis not present

## 2018-11-06 DIAGNOSIS — S32009A Unspecified fracture of unspecified lumbar vertebra, initial encounter for closed fracture: Secondary | ICD-10-CM | POA: Diagnosis not present

## 2018-11-06 DIAGNOSIS — Z780 Asymptomatic menopausal state: Secondary | ICD-10-CM | POA: Diagnosis not present

## 2018-11-06 DIAGNOSIS — M40204 Unspecified kyphosis, thoracic region: Secondary | ICD-10-CM | POA: Diagnosis not present

## 2018-12-05 IMAGING — CT CT CERVICAL SPINE W/O CM
3 series · 14 of 27 positions shown, 17 images · non-contrast
Comparison: 06/29/2018.

CLINICAL DATA: Large posterior scalp hematoma after falling and
hitting her head today. Taking blood thinners.

EXAM:
CT HEAD WITHOUT CONTRAST
CT CERVICAL SPINE WITHOUT CONTRAST
TECHNIQUE: Multidetector CT imaging of the head and cervical spine was
performed following the standard protocol without intravenous
contrast. Multiplanar CT image reconstructions of the cervical spine
were also generated.

[Series 4: c_spine 2.0 st · axial · 0.30mm/px · z∈[-231,-123]mm · 5 of 82 slices shown, 7 images]
[im 14/82  soft-tissue]
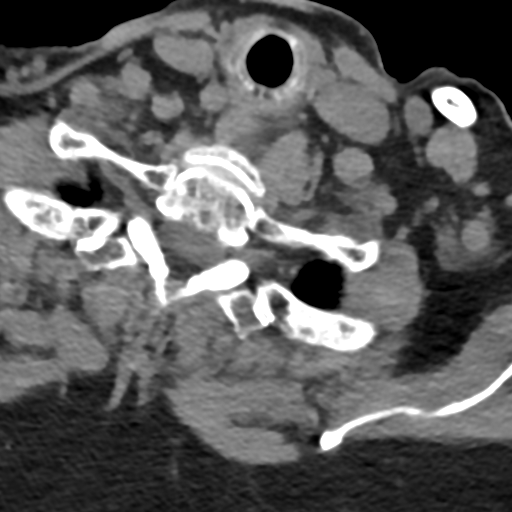
[im 14/82  bone]
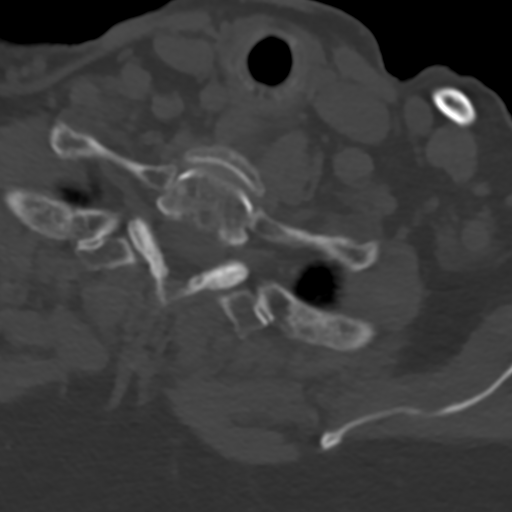
[im 28/82  bone]
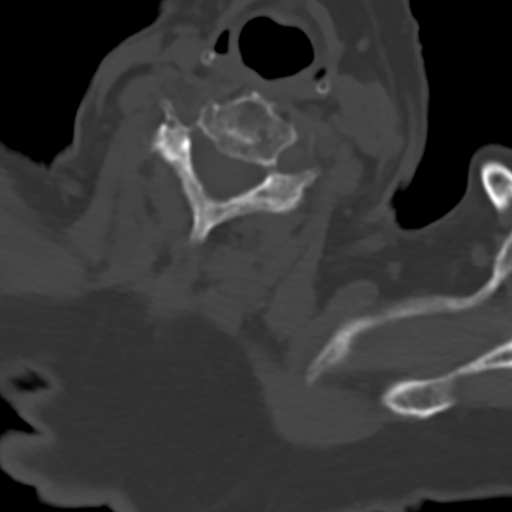
[im 41/82  bone]
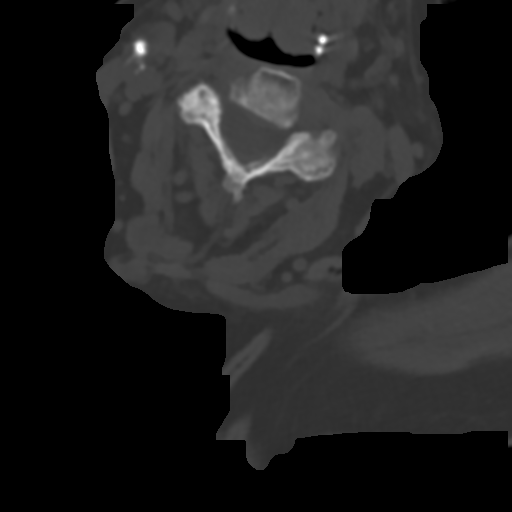
[im 55/82  bone]
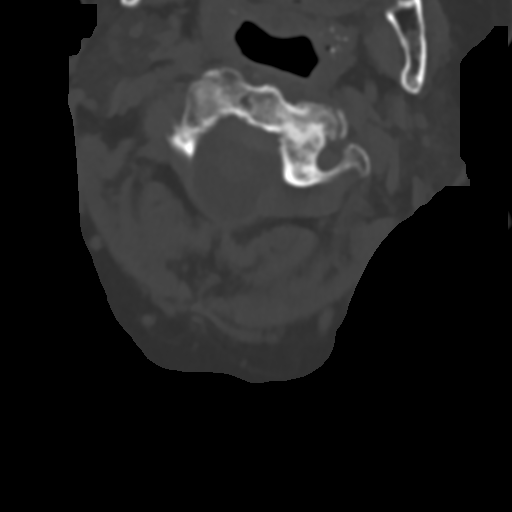
[im 68/82  soft-tissue]
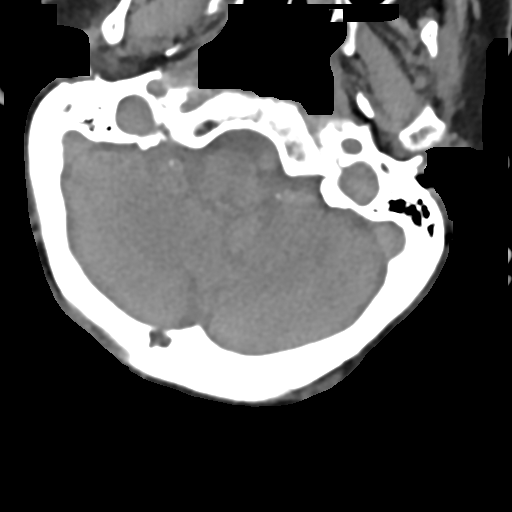
[im 68/82  bone]
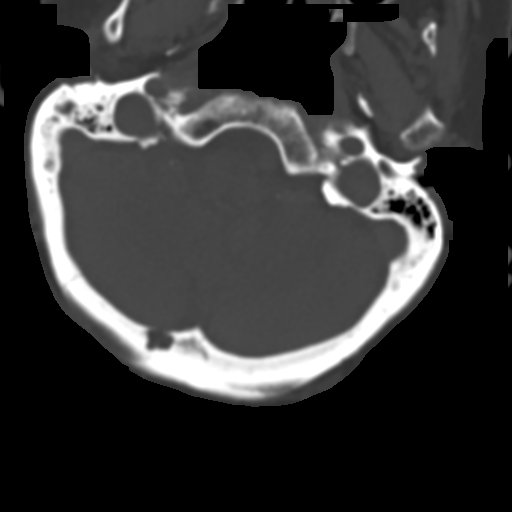

[Series 6: sagittal bone · sagittal · 0.27mm/px · 5 of 61 slices shown, 6 images]
[im 21/61  bone]
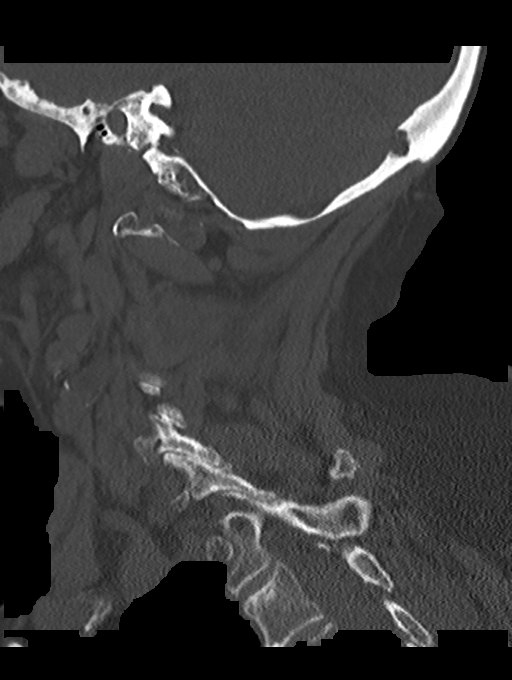
[im 26/61  bone]
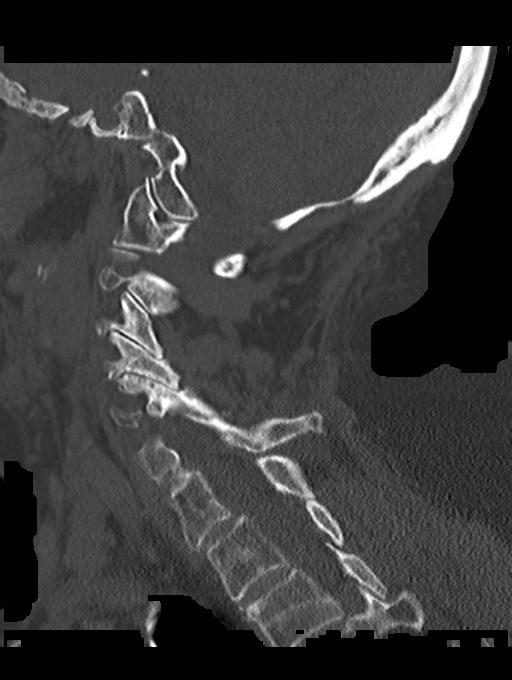
[im 31/61  soft-tissue]
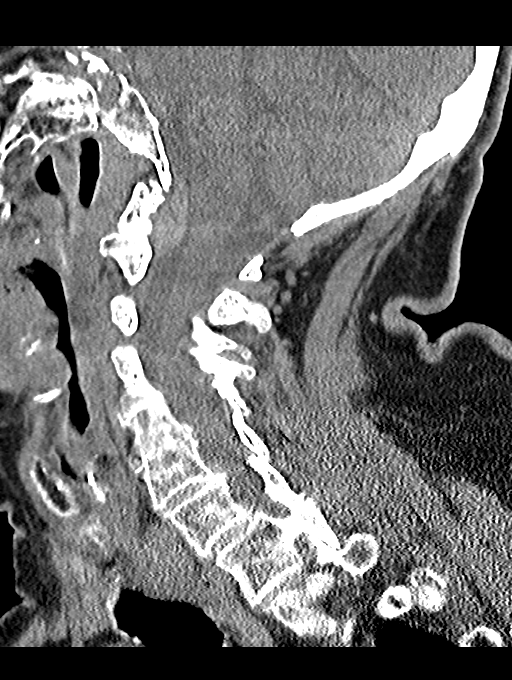
[im 31/61  bone]
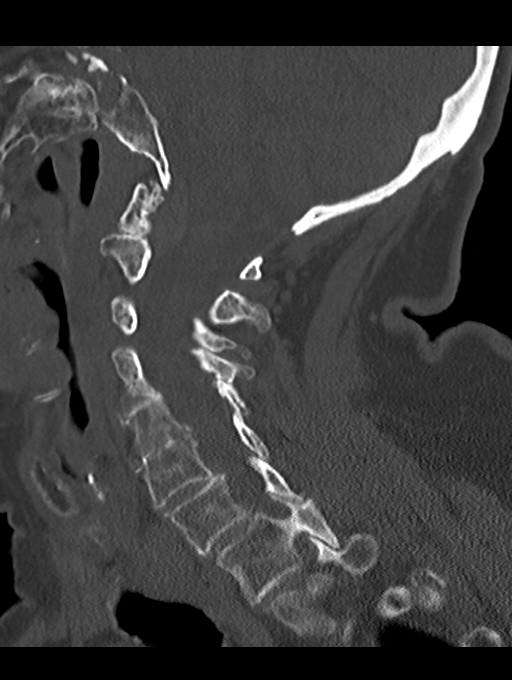
[im 36/61  bone]
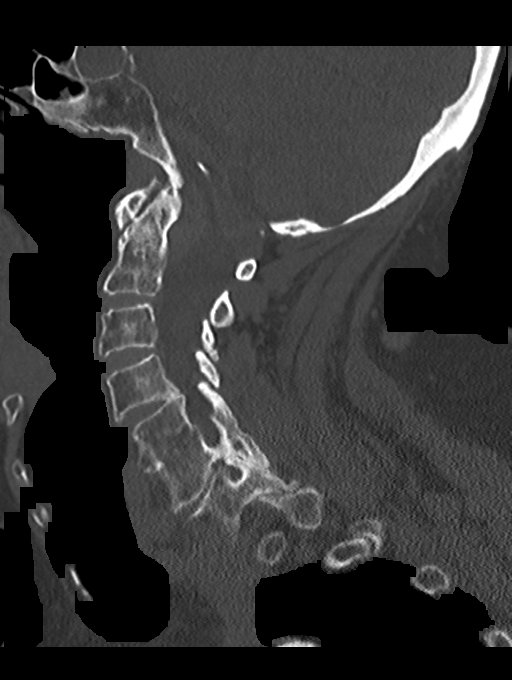
[im 41/61  bone]
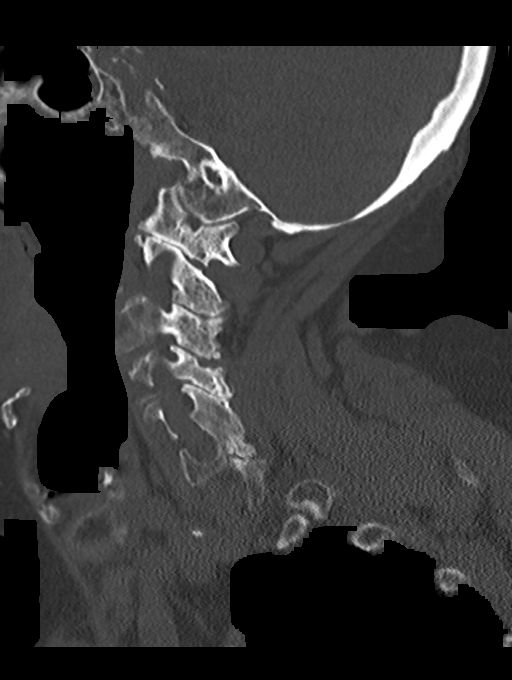

[Series 8: orthogonal axial st · axial · 0.21mm/px · z∈[-218,-152]mm · 4 of 75 slices shown]
[im 13/75  bone]
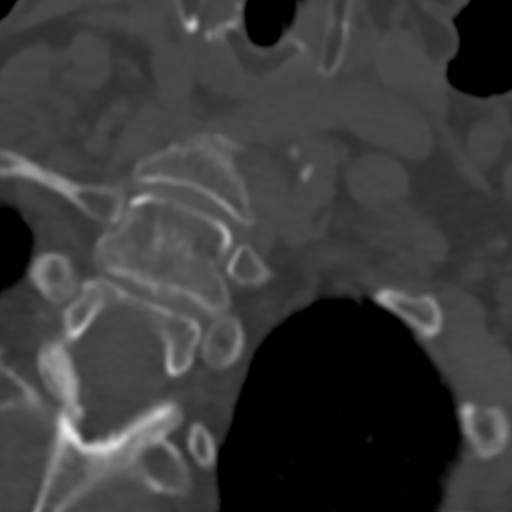
[im 25/75  bone]
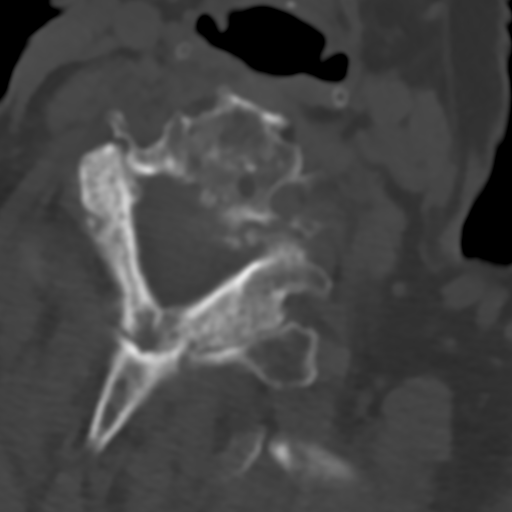
[im 38/75  bone]
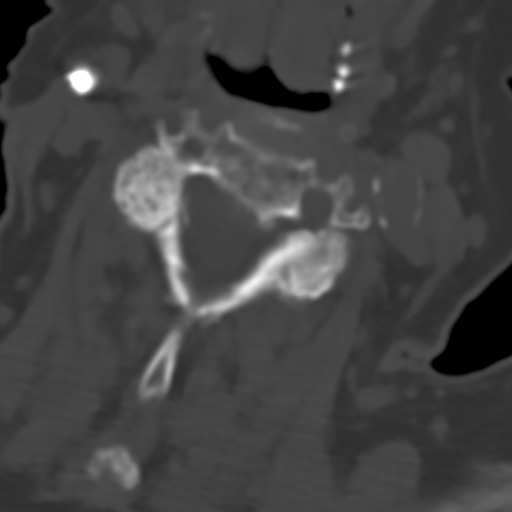
[im 50/75  bone]
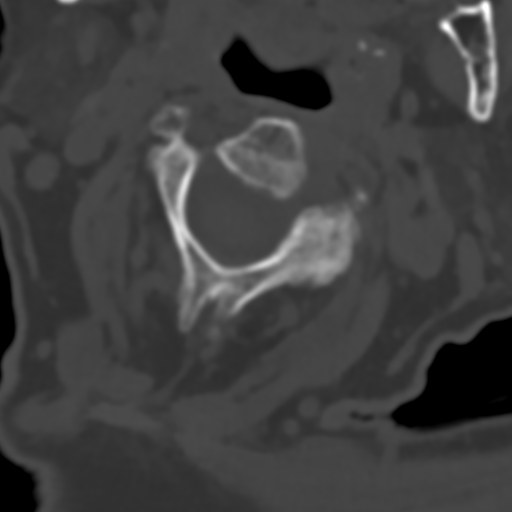

[14 of 27 positions shown; findings below may reference images not displayed]

FINDINGS: CT HEAD FINDINGS

Brain: Diffusely enlarged ventricles and subarachnoid spaces. Patchy
white matter low density in both cerebral hemispheres. Old bilateral
basal ganglia/internal capsule lacunar infarcts. No intracranial
hemorrhage, mass lesion or CT evidence of acute infarction.

Vascular: No hyperdense vessel or unexpected calcification.

Skull: Nondisplaced right occipital bone fracture. Bilateral
hyperostosis frontalis.

Sinuses/Orbits: Status post bilateral cataract extraction.
Unremarkable paranasal sinuses.

Other: Moderately large right posterolateral scalp hematoma and
associated laceration with soft tissue air.

CT CERVICAL SPINE FINDINGS

Alignment: Stable mild anterolisthesis at the C6-7 and C7-T1 levels.

Skull base and vertebrae: Previously described right occipital bone
fracture. No cervical spine fractures seen.

Soft tissues and spinal canal: No prevertebral fluid or swelling. No
visible canal hematoma.

Disc levels:  Multilevel degenerative changes.

Upper chest: Clear lung apices.

Other: Bilateral carotid artery calcifications. Stable calcified
lymph node in the neck on the right.
IMPRESSION: 1. Nondisplaced right occipital bone fracture.
2. No intracranial hemorrhage.
3. No cervical spine fracture or traumatic subluxation.
4. Right posterior scalp hematoma and laceration.
5. Stable diffuse cerebral and cerebellar atrophy, chronic small
vessel white matter ischemic changes and old lacunar infarcts.
6. Multilevel cervical spine degenerative changes.
7. Bilateral carotid artery atheromatous calcifications.

## 2019-01-23 DIAGNOSIS — H401111 Primary open-angle glaucoma, right eye, mild stage: Secondary | ICD-10-CM | POA: Diagnosis not present

## 2019-01-27 DIAGNOSIS — H401111 Primary open-angle glaucoma, right eye, mild stage: Secondary | ICD-10-CM | POA: Diagnosis not present

## 2019-01-27 DIAGNOSIS — H04123 Dry eye syndrome of bilateral lacrimal glands: Secondary | ICD-10-CM | POA: Diagnosis not present

## 2019-02-03 DIAGNOSIS — H349 Unspecified retinal vascular occlusion: Secondary | ICD-10-CM | POA: Diagnosis not present

## 2019-02-03 DIAGNOSIS — H04123 Dry eye syndrome of bilateral lacrimal glands: Secondary | ICD-10-CM | POA: Diagnosis not present

## 2019-02-20 DIAGNOSIS — E1169 Type 2 diabetes mellitus with other specified complication: Secondary | ICD-10-CM | POA: Diagnosis not present

## 2019-02-20 DIAGNOSIS — R54 Age-related physical debility: Secondary | ICD-10-CM | POA: Diagnosis not present

## 2019-02-20 DIAGNOSIS — Z0000 Encounter for general adult medical examination without abnormal findings: Secondary | ICD-10-CM | POA: Diagnosis not present

## 2019-02-20 DIAGNOSIS — H409 Unspecified glaucoma: Secondary | ICD-10-CM | POA: Diagnosis not present

## 2019-02-20 DIAGNOSIS — E039 Hypothyroidism, unspecified: Secondary | ICD-10-CM | POA: Diagnosis not present

## 2019-02-20 DIAGNOSIS — R350 Frequency of micturition: Secondary | ICD-10-CM | POA: Diagnosis not present

## 2019-02-20 DIAGNOSIS — S32000D Wedge compression fracture of unspecified lumbar vertebra, subsequent encounter for fracture with routine healing: Secondary | ICD-10-CM | POA: Diagnosis not present

## 2019-02-20 DIAGNOSIS — I10 Essential (primary) hypertension: Secondary | ICD-10-CM | POA: Diagnosis not present

## 2019-02-20 DIAGNOSIS — M810 Age-related osteoporosis without current pathological fracture: Secondary | ICD-10-CM | POA: Diagnosis not present

## 2019-03-12 ENCOUNTER — Other Ambulatory Visit: Payer: Self-pay

## 2019-03-12 ENCOUNTER — Encounter: Payer: Self-pay | Admitting: Podiatry

## 2019-03-12 ENCOUNTER — Ambulatory Visit (INDEPENDENT_AMBULATORY_CARE_PROVIDER_SITE_OTHER): Payer: Medicare HMO | Admitting: Podiatry

## 2019-03-12 VITALS — Temp 99.3°F

## 2019-03-12 DIAGNOSIS — B351 Tinea unguium: Secondary | ICD-10-CM | POA: Diagnosis not present

## 2019-03-12 DIAGNOSIS — M79675 Pain in left toe(s): Secondary | ICD-10-CM | POA: Diagnosis not present

## 2019-03-12 DIAGNOSIS — M79674 Pain in right toe(s): Secondary | ICD-10-CM | POA: Diagnosis not present

## 2019-03-12 NOTE — Patient Instructions (Addendum)

## 2019-03-20 DIAGNOSIS — H401133 Primary open-angle glaucoma, bilateral, severe stage: Secondary | ICD-10-CM | POA: Diagnosis not present

## 2019-03-21 DIAGNOSIS — Z9114 Patient's other noncompliance with medication regimen: Secondary | ICD-10-CM | POA: Diagnosis not present

## 2019-03-21 DIAGNOSIS — H401133 Primary open-angle glaucoma, bilateral, severe stage: Secondary | ICD-10-CM | POA: Diagnosis not present

## 2019-03-21 DIAGNOSIS — H4302 Vitreous prolapse, left eye: Secondary | ICD-10-CM | POA: Diagnosis not present

## 2019-03-21 DIAGNOSIS — H5462 Unqualified visual loss, left eye, normal vision right eye: Secondary | ICD-10-CM | POA: Diagnosis not present

## 2019-03-21 DIAGNOSIS — Z79899 Other long term (current) drug therapy: Secondary | ICD-10-CM | POA: Diagnosis not present

## 2019-03-26 ENCOUNTER — Encounter: Payer: Self-pay | Admitting: Podiatry

## 2019-03-26 NOTE — Progress Notes (Signed)
Subjective:  Linda Copeland presents to clinic today with cc of  painful, thick, discolored, elongated toenails 1-5 b/l that become tender and cannot cut because of thickness.  Pain is aggravated when wearing enclosed shoe gear.  She voices no new pedal concerns on today's visit.   Current Outpatient Medications:  .  acetaminophen (TYLENOL) 325 MG tablet, Take 2 tablets (650 mg total) by mouth every 6 (six) hours as needed for mild pain., Disp: , Rfl:  .  bimatoprost (LUMIGAN) 0.03 % ophthalmic solution, Place 1 drop into both eyes 2 (two) times daily. , Disp: , Rfl:  .  dorzolamide-timolol (COSOPT) 22.3-6.8 MG/ML ophthalmic solution, Place 1 drop into the right eye 2 (two) times daily., Disp: , Rfl:  .  HYDROcodone-acetaminophen (NORCO/VICODIN) 5-325 MG tablet, TK 1 T PO Q 8 TO 12 H PRN P, Disp: , Rfl: 0 .  ibandronate (BONIVA) 150 MG tablet, TK 1 T PO QAM WITH 8 OUNCES OF WATER. DO NOT EAT OR LIE DOWN FOR 30 MINUTES AFTER TAKING, Disp: , Rfl:  .  levothyroxine (SYNTHROID, LEVOTHROID) 100 MCG tablet, Take 100 mcg by mouth daily before breakfast., Disp: , Rfl:  .  lidocaine (LIDODERM) 5 %, Place 1 patch onto the skin daily. Remove & Discard patch within 12 hours or as directed by MD, Disp: 30 patch, Rfl: 0 .  lisinopril (PRINIVIL,ZESTRIL) 20 MG tablet, Take 20 mg by mouth daily., Disp: , Rfl:  .  LUMIGAN 0.01 % SOLN, Place 1 drop into both eyes at bedtime. , Disp: , Rfl: 0 .  methocarbamol (ROBAXIN) 500 MG tablet, Take 1 tablet (500 mg total) by mouth at bedtime as needed for muscle spasms., Disp: 5 tablet, Rfl: 0 .  naproxen sodium (ALEVE) 220 MG tablet, Take 220 mg by mouth 2 (two) times daily as needed (pain)., Disp: , Rfl:    Allergies  Allergen Reactions  . Codeine Nausea And Vomiting and Other (See Comments)    uknown  . Biaxin [Clarithromycin] Other (See Comments)    Sweats  . Caffeine     Shaky     Objective: Vitals:   03/12/19 0941  Temp: 99.3 F (37.4 C)    Physical  Examination:  Vascular Examination: Capillary refill time immediate x 10 digits.  Palpable DP pulses 1/4 b/l.  PT pulses 1/4 b/l.  Digital hair present b/l.  No edema noted b/l.  Skin temperature gradient WNL b/l.  Dermatological Examination: Skin with normal turgor, texture and tone b/l.  No open wounds b/l.  No interdigital macerations noted b/l.  Elongated, thick, discolored brittle toenails with subungual debris and pain on dorsal palpation of nailbeds 1-5 b/l.  Musculoskeletal Examination: Muscle strength 5/5 to all muscle groups b/l.  No pain, crepitus or joint discomfort with active/passive ROM.  Neurological Examination: Sensation intact 5/5 b/l with 10 gram monofilament.  Vibratory sensation intact b/l.  Proprioceptive sensation intact b/l.  Assessment: Mycotic nail infection with pain 1-5 b/l  Plan: 1. Toenails 1-5 b/l were debrided in length and girth without iatrogenic laceration. 2.  Continue soft, supportive shoe gear daily. 3.  Report any pedal injuries to medical professional. 4.  Follow up 3 months. 5.  Patient/POA to call should there be a question/concern in there interim.

## 2019-04-17 DIAGNOSIS — H401133 Primary open-angle glaucoma, bilateral, severe stage: Secondary | ICD-10-CM | POA: Diagnosis not present

## 2019-04-18 DIAGNOSIS — H4302 Vitreous prolapse, left eye: Secondary | ICD-10-CM | POA: Diagnosis not present

## 2019-04-18 DIAGNOSIS — H401133 Primary open-angle glaucoma, bilateral, severe stage: Secondary | ICD-10-CM | POA: Diagnosis not present

## 2019-06-18 ENCOUNTER — Ambulatory Visit (INDEPENDENT_AMBULATORY_CARE_PROVIDER_SITE_OTHER): Payer: Medicare HMO | Admitting: Podiatry

## 2019-06-18 ENCOUNTER — Other Ambulatory Visit: Payer: Self-pay

## 2019-06-18 ENCOUNTER — Encounter: Payer: Self-pay | Admitting: Podiatry

## 2019-06-18 DIAGNOSIS — B351 Tinea unguium: Secondary | ICD-10-CM | POA: Diagnosis not present

## 2019-06-18 DIAGNOSIS — M79674 Pain in right toe(s): Secondary | ICD-10-CM | POA: Diagnosis not present

## 2019-06-18 DIAGNOSIS — M79675 Pain in left toe(s): Secondary | ICD-10-CM | POA: Diagnosis not present

## 2019-06-18 NOTE — Patient Instructions (Signed)

## 2019-06-23 DIAGNOSIS — H4302 Vitreous prolapse, left eye: Secondary | ICD-10-CM | POA: Diagnosis not present

## 2019-06-23 DIAGNOSIS — H401133 Primary open-angle glaucoma, bilateral, severe stage: Secondary | ICD-10-CM | POA: Diagnosis not present

## 2019-06-24 NOTE — Progress Notes (Signed)
Subjective: Linda Copeland is seen today for follow up painful, elongated, thickened toenails 1-5 b/l feet that she cannot cut. Pain interferes with daily activities. Aggravating factor includes wearing enclosed shoe gear and relieved with periodic debridement.  Current Outpatient Medications on File Prior to Visit  Medication Sig  . acetaminophen (TYLENOL) 325 MG tablet Take 2 tablets (650 mg total) by mouth every 6 (six) hours as needed for mild pain.  . bimatoprost (LUMIGAN) 0.03 % ophthalmic solution Place 1 drop into both eyes 2 (two) times daily.   . brimonidine (ALPHAGAN) 0.2 % ophthalmic solution INT 1 GTT IN OU TID  . diphenhydrAMINE (BENADRYL) 25 MG tablet Take by mouth.  . dorzolamide-timolol (COSOPT) 22.3-6.8 MG/ML ophthalmic solution Place 1 drop into the right eye 2 (two) times daily.  Marland Kitchen HYDROcodone-acetaminophen (NORCO/VICODIN) 5-325 MG tablet TK 1 T PO Q 8 TO 12 H PRN P  . ibandronate (BONIVA) 150 MG tablet TK 1 T PO QAM WITH 8 OUNCES OF WATER. DO NOT EAT OR LIE DOWN FOR 30 MINUTES AFTER TAKING  . levothyroxine (SYNTHROID, LEVOTHROID) 100 MCG tablet Take 100 mcg by mouth daily before breakfast.  . lidocaine (LIDODERM) 5 % Place 1 patch onto the skin daily. Remove & Discard patch within 12 hours or as directed by MD  . lisinopril (PRINIVIL,ZESTRIL) 20 MG tablet Take 20 mg by mouth daily.  Marland Kitchen LUMIGAN 0.01 % SOLN Place 1 drop into both eyes at bedtime.   . methocarbamol (ROBAXIN) 500 MG tablet Take 1 tablet (500 mg total) by mouth at bedtime as needed for muscle spasms.  . naproxen sodium (ALEVE) 220 MG tablet Take 220 mg by mouth 2 (two) times daily as needed (pain).  . RHOPRESSA 0.02 % SOLN INT 1 GTT IN OU Q NIGHT   No current facility-administered medications on file prior to visit.      Allergies  Allergen Reactions  . Codeine Nausea And Vomiting and Other (See Comments)    uknown  . Biaxin [Clarithromycin] Other (See Comments)    Sweats  . Caffeine     Shaky     Objective:  Vascular Examination: Capillary refill time immediate x 10 digits.  Dorsalis pedis and Posterior tibial pulses 1/4 b/l.  Digital hair present x 10 digits.  Skin temperature gradient WNL b/l.   Dermatological Examination: Skin with normal turgor, texture and tone b/l.  No open wounds b/l.  Toenails 1-5 b/l discolored, thick, dystrophic with subungual debris and pain with palpation to nailbeds due to thickness of nails.  Musculoskeletal: Muscle strength 5/5 to all LE muscle groups  No gross bony deformities b/l.  No pain with calf compression b/l.  No pain, crepitus or joint limitation noted with ROM.   Neurological Examination: Protective sensation intact with 10 gram monofilament bilaterally.  Epicritic sensation present bilaterally.  Vibratory sensation intact bilaterally.   Assessment: Painful onychomycosis toenails 1-5 b/l   Plan: 1. Toenails 1-5 b/l were debrided in length and girth without iatrogenic bleeding. 2. Patient to continue soft, supportive shoe gear 3. Patient to report any pedal injuries to medical professional immediately. 4. Follow up 3 months.  5. Patient/POA to call should there be a concern in the interim.

## 2019-07-09 ENCOUNTER — Other Ambulatory Visit: Payer: Self-pay | Admitting: Family Medicine

## 2019-07-09 ENCOUNTER — Ambulatory Visit
Admission: RE | Admit: 2019-07-09 | Discharge: 2019-07-09 | Disposition: A | Discharge status: Home / Self Care | Payer: Medicare HMO | Source: Ambulatory Visit | Attending: Family Medicine | Admitting: Family Medicine

## 2019-07-09 DIAGNOSIS — R079 Chest pain, unspecified: Secondary | ICD-10-CM

## 2019-07-09 DIAGNOSIS — M79602 Pain in left arm: Secondary | ICD-10-CM | POA: Diagnosis not present

## 2019-07-14 DIAGNOSIS — H401133 Primary open-angle glaucoma, bilateral, severe stage: Secondary | ICD-10-CM | POA: Diagnosis not present

## 2019-08-05 DIAGNOSIS — L218 Other seborrheic dermatitis: Secondary | ICD-10-CM | POA: Diagnosis not present

## 2019-08-11 DIAGNOSIS — H401133 Primary open-angle glaucoma, bilateral, severe stage: Secondary | ICD-10-CM | POA: Diagnosis not present

## 2019-08-25 DIAGNOSIS — E1169 Type 2 diabetes mellitus with other specified complication: Secondary | ICD-10-CM | POA: Diagnosis not present

## 2019-08-25 DIAGNOSIS — I10 Essential (primary) hypertension: Secondary | ICD-10-CM | POA: Diagnosis not present

## 2019-08-25 DIAGNOSIS — M810 Age-related osteoporosis without current pathological fracture: Secondary | ICD-10-CM | POA: Diagnosis not present

## 2019-08-25 DIAGNOSIS — H409 Unspecified glaucoma: Secondary | ICD-10-CM | POA: Diagnosis not present

## 2019-08-25 DIAGNOSIS — S32000D Wedge compression fracture of unspecified lumbar vertebra, subsequent encounter for fracture with routine healing: Secondary | ICD-10-CM | POA: Diagnosis not present

## 2019-08-25 DIAGNOSIS — H9190 Unspecified hearing loss, unspecified ear: Secondary | ICD-10-CM | POA: Diagnosis not present

## 2019-08-25 DIAGNOSIS — R54 Age-related physical debility: Secondary | ICD-10-CM | POA: Diagnosis not present

## 2019-08-25 DIAGNOSIS — E039 Hypothyroidism, unspecified: Secondary | ICD-10-CM | POA: Diagnosis not present

## 2019-09-17 ENCOUNTER — Ambulatory Visit: Payer: Medicare HMO | Admitting: Podiatry

## 2019-11-26 DIAGNOSIS — D1723 Benign lipomatous neoplasm of skin and subcutaneous tissue of right leg: Secondary | ICD-10-CM | POA: Diagnosis not present

## 2019-12-31 ENCOUNTER — Ambulatory Visit: Payer: Medicare HMO | Admitting: Podiatry

## 2019-12-31 ENCOUNTER — Encounter: Payer: Self-pay | Admitting: Podiatry

## 2019-12-31 ENCOUNTER — Other Ambulatory Visit: Payer: Self-pay

## 2019-12-31 DIAGNOSIS — M79675 Pain in left toe(s): Secondary | ICD-10-CM

## 2019-12-31 DIAGNOSIS — S90212A Contusion of left great toe with damage to nail, initial encounter: Secondary | ICD-10-CM | POA: Diagnosis not present

## 2019-12-31 DIAGNOSIS — M79674 Pain in right toe(s): Secondary | ICD-10-CM | POA: Diagnosis not present

## 2019-12-31 DIAGNOSIS — B351 Tinea unguium: Secondary | ICD-10-CM

## 2019-12-31 NOTE — Patient Instructions (Addendum)
Linda Copeland,  Your Mom complained about left great toe pain for the past 4 weeks. She had a blood blister under her left great toe and toenail was very loose. There's a new nail growing in. I was able to get the nail off as it was very loose. The toe is not infected, but I would like you to apply Neosporin Cream to the nailbed of the left great toe once daily for one week. After 7 days, she should be fine.   Apply Neosporin Cream to left great toe nail bed once daily for one week. Call if you have any problems. Your toenail should grow back in 3-4 months.  She can follow up in 3 months. Call if you have any questions or concerns.   Onychomycosis/Fungal Toenails  WHAT IS IT? An infection that lies within the keratin of your nail plate that is caused by a fungus.  WHY ME? Fungal infections affect all ages, sexes, races, and creeds.  There may be many factors that predispose you to a fungal infection such as age, coexisting medical conditions such as diabetes, or an autoimmune disease; stress, medications, fatigue, genetics, etc.  Bottom line: fungus thrives in a warm, moist environment and your shoes offer such a location.  IS IT CONTAGIOUS? Theoretically, yes.  You do not want to share shoes, nail clippers or files with someone who has fungal toenails.  Walking around barefoot in the same room or sleeping in the same bed is unlikely to transfer the organism.  It is important to realize, however, that fungus can spread easily from one nail to the next on the same foot.  HOW DO WE TREAT THIS?  There are several ways to treat this condition.  Treatment may depend on many factors such as age, medications, pregnancy, liver and kidney conditions, etc.  It is best to ask your doctor which options are available to you.  1. No treatment.   Unlike many other medical concerns, you can live with this condition.  However for many people this can be a painful condition and may lead to ingrown toenails or a bacterial  infection.  It is recommended that you keep the nails cut short to help reduce the amount of fungal nail. 2. Topical treatment.  These range from herbal remedies to prescription strength nail lacquers.  About 40-50% effective, topicals require twice daily application for approximately 9 to 12 months or until an entirely new nail has grown out.  The most effective topicals are medical grade medications available through physicians offices. 3. Oral antifungal medications.  With an 80-90% cure rate, the most common oral medication requires 3 to 4 months of therapy and stays in your system for a year as the new nail grows out.  Oral antifungal medications do require blood work to make sure it is a safe drug for you.  A liver function panel will be performed prior to starting the medication and after the first month of treatment.  It is important to have the blood work performed to avoid any harmful side effects.  In general, this medication safe but blood work is required. 4. Laser Therapy.  This treatment is performed by applying a specialized laser to the affected nail plate.  This therapy is noninvasive, fast, and non-painful.  It is not covered by insurance and is therefore, out of pocket.  The results have been very good with a 80-95% cure rate.  [facility] is the only practice in the area  to offer this therapy. 5. Permanent Nail Avulsion.  Removing the entire nail so that a new nail will not grow back.

## 2020-01-01 NOTE — Progress Notes (Signed)
Subjective: Linda Copeland presents today for follow up of painful mycotic nails b/l that are difficult to trim. Pain interferes with ambulation. Aggravating factors include wearing enclosed shoe gear. Pain is relieved with periodic professional debridement.   She states Linda left great toe is most symptomatic today. Denies any falls or trauma to digit. Denies any redness, drainage or swelling   Allergies  Allergen Reactions  . Codeine Nausea And Vomiting and Other (See Comments)    uknown  . Biaxin [Clarithromycin] Other (See Comments)    Sweats  . Caffeine     Shaky     Objective: There were no vitals filed for this visit.  [name]  in NAD. AAO x 3.   Vascular Examination:  Capillary refill time to digits immediate b/l. Faintly palpable DP pulses b/l. Faintly palpable PT pulses b/l. Pedal hair absent b/l Skin temperature gradient within normal limits b/l.  Dermatological Examination: Pedal skin with normal turgor, texture and tone bilaterally. No open wounds bilaterally. No interdigital macerations bilaterally. Toenails 1-5 b/l elongated, dystrophic, thickened, crumbly with subungual debris and tenderness to dorsal palpation.   Subungual hematoma noted of the left hallux with active serosanguinous drainage and onycholysis of nailplate. There is an underlying new nailplate growing proximal 1/3 of nailbed.  No erythema, no edema. There is thin serosanguinous  fluid expressed when pressure applied to digit.  Musculoskeletal: Normal muscle strength 5/5 to all lower extremity muscle groups bilaterally, no gross bony deformities bilaterally, no pain crepitus or joint limitation noted with ROM b/l and utilizes wheelchair for mobility assistance.  Neurological: Protective sensation intact 5/5 intact bilaterally with 10g monofilament b/l Vibratory sensation intact b/l  Assessment: 1. Pain due to onychomycosis of toenails of both feet   2. Contusion of left  great toe with damage to nail, initial encounter   3. Pain of toe of left foot    Plan: -Toenails 2-5 b/l were debrided in length and girth with sterile nail nippers and dremel without iatrogenic bleeding. Left hallux nailplate gently debrided from it's remaining attachment to digit.  Nailbed cleansed with alcohol. Triple antibiotic ointment and bandaid.Explained to Linda the toenail should grow back in 3-4 months. -Message written to Linda Copeland giving instructions to apply Neosporin Cream to left hallux once daily for one week.  -Patient to continue soft, supportive shoe gear daily. -Patient to report any pedal injuries to medical professional immediately. -Patient/POA to call should there be question/concern in the interim.  Return in about 3 months (around 03/31/2020) for nail trim.

## 2020-02-04 ENCOUNTER — Ambulatory Visit: Payer: Medicare HMO | Admitting: Podiatry

## 2020-03-10 DIAGNOSIS — M810 Age-related osteoporosis without current pathological fracture: Secondary | ICD-10-CM | POA: Diagnosis not present

## 2020-03-10 DIAGNOSIS — H409 Unspecified glaucoma: Secondary | ICD-10-CM | POA: Diagnosis not present

## 2020-03-10 DIAGNOSIS — E1169 Type 2 diabetes mellitus with other specified complication: Secondary | ICD-10-CM | POA: Diagnosis not present

## 2020-03-10 DIAGNOSIS — H9190 Unspecified hearing loss, unspecified ear: Secondary | ICD-10-CM | POA: Diagnosis not present

## 2020-03-10 DIAGNOSIS — S32000D Wedge compression fracture of unspecified lumbar vertebra, subsequent encounter for fracture with routine healing: Secondary | ICD-10-CM | POA: Diagnosis not present

## 2020-03-10 DIAGNOSIS — R54 Age-related physical debility: Secondary | ICD-10-CM | POA: Diagnosis not present

## 2020-03-10 DIAGNOSIS — E039 Hypothyroidism, unspecified: Secondary | ICD-10-CM | POA: Diagnosis not present

## 2020-03-10 DIAGNOSIS — Z0000 Encounter for general adult medical examination without abnormal findings: Secondary | ICD-10-CM | POA: Diagnosis not present

## 2020-03-10 DIAGNOSIS — I10 Essential (primary) hypertension: Secondary | ICD-10-CM | POA: Diagnosis not present

## 2020-03-31 ENCOUNTER — Other Ambulatory Visit: Payer: Self-pay

## 2020-03-31 ENCOUNTER — Encounter: Payer: Self-pay | Admitting: Podiatry

## 2020-03-31 ENCOUNTER — Ambulatory Visit (INDEPENDENT_AMBULATORY_CARE_PROVIDER_SITE_OTHER): Payer: Medicare HMO | Admitting: Podiatry

## 2020-03-31 DIAGNOSIS — M79674 Pain in right toe(s): Secondary | ICD-10-CM | POA: Diagnosis not present

## 2020-03-31 DIAGNOSIS — M79675 Pain in left toe(s): Secondary | ICD-10-CM | POA: Diagnosis not present

## 2020-03-31 DIAGNOSIS — B351 Tinea unguium: Secondary | ICD-10-CM

## 2020-04-04 NOTE — Progress Notes (Signed)
Subjective:  Patient ID: Linda Copeland, female    DOB: [birth year],  MRN: [id number]  86 y.o. female presents with painful thick toenails that are difficult to trim. Pain interferes with ambulation. Aggravating factors include wearing enclosed shoe gear. Pain is relieved with periodic professional debridement..    Review of Systems: Negative except as noted in the HPI. Denies N/V/F/Ch. Past Medical History:  Diagnosis Date   Arthritis    bilateral severe facet L4-L5, L5-S1   Carpal tunnel syndrome of right wrist    Chronic cervical radiculopathy    C5   Compression fracture of lumbar vertebra (HCC)    L2-L3   Environmental and seasonal allergies    Frequent urination    Hematoma of frontal scalp 10/06/2016   History of bronchitis    History of humerus fracture 10/06/2016   Right proximal   History of subdural hematoma 10/06/2016   Left falcotentorial    HOH (hard of hearing)    Hypertension    Hypothyroidism    Nasal fracture 10/06/2016   OA (osteoarthritis)    AC glenohumeral joinr   Osteopenia    Spinal stenosis at L4-L5 level    Past Surgical History:  Procedure Laterality Date   CARPAL TUNNEL RELEASE Right 01/22/2018   Procedure: RIGHT CARPAL TUNNEL RELEASE;  Surgeon: Daryll Brod, MD;  Location: [facility];  Service: Orthopedics;  Laterality: Right;   COLONOSCOPY     Patient Active Problem List   Diagnosis Date Noted   Primary open angle glaucoma (POAG) of both eyes, severe stage 03/21/2019   Closed fracture of nasal bone with routine healing 10/11/2016   Subdural hematoma (HCC) 10/05/2016    Current Outpatient Medications:    acetaminophen (TYLENOL) 325 MG tablet, Take 2 tablets (650 mg total) by mouth every 6 (six) hours as needed for mild pain., Disp: , Rfl:    bimatoprost (LUMIGAN) 0.03 % ophthalmic solution, Place 1 drop into both eyes 2 (two) times daily. , Disp: , Rfl:    brimonidine (ALPHAGAN) 0.2 % ophthalmic  solution, INT 1 GTT IN OU TID, Disp: , Rfl:    diphenhydrAMINE (BENADRYL) 25 MG tablet, Take by mouth., Disp: , Rfl:    diphenhydrAMINE (SOMINEX) 25 MG tablet, Take by mouth., Disp: , Rfl:    dorzolamide (TRUSOPT) 2 % ophthalmic solution, 1 drop 2 (two) times daily., Disp: , Rfl:    dorzolamide-timolol (COSOPT) 22.3-6.8 MG/ML ophthalmic solution, Place 1 drop into the right eye 2 (two) times daily., Disp: , Rfl:    fluticasone (CUTIVATE) 0.05 % cream, APP AA UP TO BID PRN, Disp: , Rfl:    FLUZONE HIGH-DOSE QUADRIVALENT 0.7 ML SUSY, , Disp: , Rfl:    HYDROcodone-acetaminophen (NORCO/VICODIN) 5-325 MG tablet, TK 1 T PO Q 8 TO 12 H PRN P, Disp: , Rfl: 0   ibandronate (BONIVA) 150 MG tablet, TK 1 T PO QAM WITH 8 OUNCES OF WATER. DO NOT EAT OR LIE DOWN FOR 30 MINUTES AFTER TAKING, Disp: , Rfl:    levothyroxine (SYNTHROID, LEVOTHROID) 100 MCG tablet, Take 100 mcg by mouth daily before breakfast., Disp: , Rfl:    lidocaine (LIDODERM) 5 %, Place 1 patch onto the skin daily. Remove & Discard patch within 12 hours or as directed by MD, Disp: 30 patch, Rfl: 0   lisinopril (PRINIVIL,ZESTRIL) 20 MG tablet, Take 20 mg by mouth daily., Disp: , Rfl:    LUMIGAN 0.01 % SOLN, Place 1 drop into both eyes at bedtime. , Disp: , Rfl:  0   methocarbamol (ROBAXIN) 500 MG tablet, Take 1 tablet (500 mg total) by mouth at bedtime as needed for muscle spasms., Disp: 5 tablet, Rfl: 0   naproxen sodium (ALEVE) 220 MG tablet, Take 220 mg by mouth 2 (two) times daily as needed (pain)., Disp: , Rfl:    RHOPRESSA 0.02 % SOLN, INT 1 GTT IN OU Q NIGHT, Disp: , Rfl:  Allergies  Allergen Reactions   Codeine Nausea And Vomiting and Other (See Comments)    uknown   Biaxin [Clarithromycin] Other (See Comments)    Sweats   Caffeine     Shaky   Social History   Tobacco Use  Smoking Status Never Smoker  Smokeless Tobacco Never Used    Objective:   Constitutional Pt is a pleasant 86 y.o. Caucasian female, WD,  WN in NAD.Marland Kitchen  Vascular Neurovascular status unchanged b/l lower extremities. Faintly palpable pedal pulses b/l. Pedal hair absent. Lower extremity skin temperature gradient within normal limits. No pain with calf compression b/l. No cyanosis or clubbing noted.  Neurologic Normal speech. Oriented to person, place, and time. Protective sensation intact 5/5 intact bilaterally with 10g monofilament b/l. Vibratory sensation intact b/l.  Dermatologic Pedal skin with normal turgor, texture and tone bilaterally. No open wounds bilaterally. No interdigital macerations bilaterally. Toenails 1-5 b/l elongated, discolored, dystrophic, thickened, crumbly with subungual debris and tenderness to dorsal palpation.  Orthopedic: Normal muscle strength 5/5 to all lower extremity muscle groups bilaterally. No pain crepitus or joint limitation noted with ROM b/l. No gross bony deformities bilaterally. Utilizes wheelchair for mobility assistance.   Radiographs: None Assessment:   1. Pain due to onychomycosis of toenails of both feet    Plan:  Patient was evaluated and treated and all questions answered. Onychomycosis with pain -Nails palliatively debridement as below. -Educated on self-care  Procedure: Nail Debridement Rationale: Pain Type of Debridement: manual, sharp debridement. Instrumentation: Nail nipper, rotary burr. Number of Nails: 10  -Examined patient. -No new findings. No new orders. -Toenails 1-5 b/l were debrided in length and girth with sterile nail nippers and dremel without iatrogenic bleeding.  -Patient to report any pedal injuries to medical professional immediately. -Patient to continue soft, supportive shoe gear daily. -Patient/POA to call should there be question/concern in the interim.  Return in about 3 months (around 07/01/2020) for nail trim.  Marzetta Board, DPM

## 2020-04-14 DIAGNOSIS — R300 Dysuria: Secondary | ICD-10-CM | POA: Diagnosis not present

## 2020-04-22 DIAGNOSIS — H401133 Primary open-angle glaucoma, bilateral, severe stage: Secondary | ICD-10-CM | POA: Diagnosis not present

## 2020-07-12 ENCOUNTER — Ambulatory Visit: Payer: Medicare HMO | Admitting: Podiatry

## 2020-09-06 DIAGNOSIS — N39498 Other specified urinary incontinence: Secondary | ICD-10-CM | POA: Diagnosis not present

## 2020-09-06 DIAGNOSIS — N952 Postmenopausal atrophic vaginitis: Secondary | ICD-10-CM | POA: Diagnosis not present

## 2020-10-12 ENCOUNTER — Encounter: Payer: Self-pay | Admitting: Rheumatology

## 2020-10-12 DIAGNOSIS — I10 Essential (primary) hypertension: Secondary | ICD-10-CM | POA: Diagnosis not present

## 2020-10-12 DIAGNOSIS — S32000D Wedge compression fracture of unspecified lumbar vertebra, subsequent encounter for fracture with routine healing: Secondary | ICD-10-CM | POA: Diagnosis not present

## 2020-10-12 DIAGNOSIS — E039 Hypothyroidism, unspecified: Secondary | ICD-10-CM | POA: Diagnosis not present

## 2020-10-12 DIAGNOSIS — M810 Age-related osteoporosis without current pathological fracture: Secondary | ICD-10-CM | POA: Diagnosis not present

## 2020-10-12 DIAGNOSIS — E1169 Type 2 diabetes mellitus with other specified complication: Secondary | ICD-10-CM | POA: Diagnosis not present

## 2020-10-12 DIAGNOSIS — D692 Other nonthrombocytopenic purpura: Secondary | ICD-10-CM | POA: Diagnosis not present

## 2020-10-12 DIAGNOSIS — R54 Age-related physical debility: Secondary | ICD-10-CM | POA: Diagnosis not present

## 2020-10-12 DIAGNOSIS — H9190 Unspecified hearing loss, unspecified ear: Secondary | ICD-10-CM | POA: Diagnosis not present

## 2020-10-12 DIAGNOSIS — H409 Unspecified glaucoma: Secondary | ICD-10-CM | POA: Diagnosis not present

## 2020-10-27 DIAGNOSIS — H401133 Primary open-angle glaucoma, bilateral, severe stage: Secondary | ICD-10-CM | POA: Diagnosis not present

## 2020-11-09 DIAGNOSIS — Z780 Asymptomatic menopausal state: Secondary | ICD-10-CM | POA: Diagnosis not present

## 2020-11-09 DIAGNOSIS — M810 Age-related osteoporosis without current pathological fracture: Secondary | ICD-10-CM | POA: Diagnosis not present

## 2020-12-02 NOTE — Progress Notes (Signed)
Office Visit Note  Patient: Linda Copeland             Date of Birth: 04/12/1931           MRN: [id number]             PCP: Gaynelle Arabian, MD Referring: Gaynelle Arabian, MD Visit Date: 12/03/2020 Occupation: @GUAROCC @  Subjective:  Treatment of osteoporosis.   History of Present Illness: Linda Copeland is a 87 y.o. female seen in consultation per request of her PCP.  She was accompanied by her granddaughter today.  Patient could not give me much history.  Based on the records she was diagnosed with osteoporosis several years ago.  She is also had several falls and vertebral fractures.  She was started on Boniva on October 12, 2020 by Dr. Marisue Humble.  Patient states that she has been tolerating medication well.  She understands that she was sent here to discuss different treatment options.  Activities of Daily Living:  Patient reports morning stiffness for 0 minutes.   Patient Denies nocturnal pain.   Difficulty dressing/grooming: Denies Difficulty climbing stairs: Reports Difficulty getting out of chair: Reports Difficulty using hands for taps, buttons, cutlery, and/or writing: Reports  Review of Systems  Constitutional: Positive for fatigue.  HENT: Positive for hearing loss. Negative for mouth sores, mouth dryness and nose dryness.   Eyes: Positive for dryness. Negative for pain and itching.  Respiratory: Negative for shortness of breath and difficulty breathing.   Cardiovascular: Negative for chest pain and palpitations.  Gastrointestinal: Negative for blood in stool, constipation and diarrhea.  Endocrine: Positive for increased urination.  Genitourinary: Positive for nocturia.  Musculoskeletal: Negative for arthralgias, joint pain, joint swelling, myalgias, morning stiffness, muscle tenderness and myalgias.  Skin: Positive for rash. Negative for color change and redness.  Allergic/Immunologic: Negative for susceptible to infections.  Neurological: Positive for numbness and  memory loss. Negative for dizziness and headaches.  Hematological: Positive for bruising/bleeding tendency.  Psychiatric/Behavioral: Positive for confusion.    PMFS History:  Patient Active Problem List   Diagnosis Date Noted  . Primary open angle glaucoma (POAG) of both eyes, severe stage 03/21/2019  . Closed fracture of nasal bone with routine healing 10/11/2016  . Subdural hematoma ([facility]) 10/05/2016    Past Medical History:  Diagnosis Date  . Arthritis    bilateral severe facet L4-L5, L5-S1  . Carpal tunnel syndrome of right wrist   . Chronic cervical radiculopathy    C5  . Compression fracture of lumbar vertebra (HCC)    L2-L3  . Environmental and seasonal allergies   . Frequent urination   . Hematoma of frontal scalp 10/06/2016  . History of bronchitis   . History of humerus fracture 10/06/2016   Right proximal  . History of subdural hematoma 10/06/2016   Left falcotentorial   . HOH (hard of hearing)   . Hypertension   . Hypothyroidism   . Nasal fracture 10/06/2016  . OA (osteoarthritis)    AC glenohumeral joinr  . Osteopenia   . Spinal stenosis at L4-L5 level     Family History  Problem Relation Age of Onset  . Diabetes Son    Past Surgical History:  Procedure Laterality Date  . CARPAL TUNNEL RELEASE Right 01/22/2018   Procedure: RIGHT CARPAL TUNNEL RELEASE;  Surgeon: Daryll Brod, MD;  Location: [facility];  Service: Orthopedics;  Laterality: Right;  . COLONOSCOPY     Social History   Social History Narrative  . Not on  file   Immunization History  Administered Date(s) Administered  . Influenza, High Dose Seasonal PF 06/22/2018, 07/11/2019  . PFIZER(Purple Top)SARS-COV-2 Vaccination 01/19/2020, 02/09/2020     Objective: Vital Signs: BP (!) 161/90 (BP Location: Right Arm, Patient Position: Sitting, Cuff Size: Normal)   Pulse 78   Resp 16   Ht 4\' 8"  (1.422 m)   Wt 127 lb 9.6 oz (57.9 kg)   BMI 28.61 kg/m    Physical Exam Vitals and  nursing note reviewed.  Constitutional:      Appearance: She is well-developed and well-nourished.  HENT:     Head: Normocephalic and atraumatic.  Eyes:     Extraocular Movements: EOM normal.     Conjunctiva/sclera: Conjunctivae normal.  Cardiovascular:     Rate and Rhythm: Normal rate and regular rhythm.     Pulses: Intact distal pulses.     Heart sounds: Normal heart sounds.  Pulmonary:     Effort: Pulmonary effort is normal.     Breath sounds: Normal breath sounds.  Abdominal:     General: Bowel sounds are normal.     Palpations: Abdomen is soft.  Musculoskeletal:     Cervical back: Normal range of motion.  Lymphadenopathy:     Cervical: No cervical adenopathy.  Skin:    General: Skin is warm and dry.     Capillary Refill: Capillary refill takes less than 2 seconds.  Neurological:     Mental Status: She is alert and oriented to person, place, and time.  Psychiatric:        Mood and Affect: Mood and affect normal.        Behavior: Behavior normal.      Musculoskeletal Exam: She had good range of motion of the cervical spine.  She has severe kyphosis.  She has limited mobility in her thoracic and lumbar spine.  Shoulder joints, elbow joints with good range of motion.  Wrist joints, MCPs PIPs and DIPs with good range of motion with no synovitis.  Hip joints were difficult to assess in the sitting position.  Knee joints with good range of motion with no synovitis.  She had no tenderness over ankles or MTPs.  CDAI Exam: CDAI Score: -- Patient Global: --; Provider Global: -- Swollen: --; Tender: -- Joint Exam 12/03/2020   No joint exam has been documented for this visit   There is currently no information documented on the homunculus. Go to the Rheumatology activity and complete the homunculus joint exam.  Investigation: No additional findings.  Imaging: No results found.  Recent Labs: Lab Results  Component Value Date   WBC 12.8 (H) 10/06/2016   HGB 13.6  01/22/2018   PLT 250 10/06/2016   NA 140 01/22/2018   K 4.1 01/22/2018   CL 104 01/22/2018   CO2 22 10/07/2016   GLUCOSE 99 01/22/2018   BUN 8 01/22/2018   CREATININE 0.40 (L) 01/22/2018   CALCIUM 9.2 10/07/2016   GFRAA >60 10/07/2016    Speciality Comments: No specialty comments available.  Procedures:  No procedures performed Allergies: Codeine, Biaxin [clarithromycin], and Caffeine   Assessment / Plan:     Visit Diagnoses: Age-related osteoporosis without current pathological fracture - Worsening osteoporosis despite bisphosphonate therapy-currently on Boniva. DEXA 11/09/20: RFN BMD 0.341 with T-score -4.6. -9% change RFN.  I could not get much history from patient or patient's granddaughter regarding the osteoporosis treatment in the past.  I reviewed the office records from her PCPs office.  It appears that patient started  taking Boniva and January 2022.  She has severe kyphosis and vertebral fractures.  She will benefit from taking bisphosphonates like Forteo or Tymlos.  Prolia subcu injections every 6 months will be a better option as the compliance may be better.  I had detailed discussion with the patient.  She does not want to take any injectable medications.  She also reviewed Prolia information.  She did not like the side effects of Prolia.  She stated that she would prefer to stay on Boniva and she does not like to leave her home and visit many doctors.  According to the patient she does not want any other treatment  as she is 87 years old.  She understands that she may continue to have vertebral fractures and worsening of kyphosis.  This is a very unfortunate situation.  Use of calcium and vitamin D was discussed.  History of vertebral fracture-I reviewed MRI of the lumbar spine from September 2019 which showed L1 vertebral body compression fracture and subacute L2 vertebral body compression fracture, chronic L3 vertebral body compression fracture.  Other medical problems are  listed as follows.  History of type 2 diabetes mellitus  History of gastroesophageal reflux (GERD)  History of hypothyroidism  History of rosacea  History of asthma  Primary open angle glaucoma (POAG) of both eyes, severe stage  Orders: No orders of the defined types were placed in this encounter.  No orders of the defined types were placed in this encounter.    Follow-Up Instructions: Return if symptoms worsen or fail to improve, for Osteoporosis.   Bo Merino, MD  Note - This record has been created using Editor, commissioning.  Chart creation errors have been sought, but may not always  have been located. Such creation errors do not reflect on  the standard of medical care.

## 2020-12-03 ENCOUNTER — Encounter: Payer: Self-pay | Admitting: Rheumatology

## 2020-12-03 ENCOUNTER — Ambulatory Visit: Payer: Medicare HMO | Admitting: Rheumatology

## 2020-12-03 ENCOUNTER — Other Ambulatory Visit: Payer: Self-pay

## 2020-12-03 VITALS — BP 161/90 | HR 78 | Resp 16 | Ht <= 58 in | Wt 127.6 lb

## 2020-12-03 DIAGNOSIS — Z8639 Personal history of other endocrine, nutritional and metabolic disease: Secondary | ICD-10-CM | POA: Diagnosis not present

## 2020-12-03 DIAGNOSIS — H401133 Primary open-angle glaucoma, bilateral, severe stage: Secondary | ICD-10-CM | POA: Diagnosis not present

## 2020-12-03 DIAGNOSIS — Z8781 Personal history of (healed) traumatic fracture: Secondary | ICD-10-CM | POA: Diagnosis not present

## 2020-12-03 DIAGNOSIS — M810 Age-related osteoporosis without current pathological fracture: Secondary | ICD-10-CM

## 2020-12-03 DIAGNOSIS — Z8709 Personal history of other diseases of the respiratory system: Secondary | ICD-10-CM | POA: Diagnosis not present

## 2020-12-03 DIAGNOSIS — Z872 Personal history of diseases of the skin and subcutaneous tissue: Secondary | ICD-10-CM

## 2020-12-03 DIAGNOSIS — Z8719 Personal history of other diseases of the digestive system: Secondary | ICD-10-CM

## 2020-12-27 DIAGNOSIS — R109 Unspecified abdominal pain: Secondary | ICD-10-CM | POA: Diagnosis not present

## 2020-12-27 DIAGNOSIS — M545 Low back pain, unspecified: Secondary | ICD-10-CM | POA: Diagnosis not present

## 2021-01-07 ENCOUNTER — Ambulatory Visit: Payer: Medicare HMO | Admitting: Rheumatology

## 2021-01-11 ENCOUNTER — Emergency Department (HOSPITAL_COMMUNITY): Payer: Medicare HMO

## 2021-01-11 ENCOUNTER — Telehealth (HOSPITAL_COMMUNITY): Payer: Self-pay | Admitting: Emergency Medicine

## 2021-01-11 ENCOUNTER — Emergency Department (HOSPITAL_COMMUNITY)
Admission: EM | Admit: 2021-01-11 | Discharge: 2021-01-11 | Disposition: A | Discharge status: Home / Self Care | Payer: Medicare HMO | Attending: Emergency Medicine | Admitting: Emergency Medicine

## 2021-01-11 DIAGNOSIS — I739 Peripheral vascular disease, unspecified: Secondary | ICD-10-CM | POA: Diagnosis not present

## 2021-01-11 DIAGNOSIS — S32040A Wedge compression fracture of fourth lumbar vertebra, initial encounter for closed fracture: Secondary | ICD-10-CM

## 2021-01-11 DIAGNOSIS — Z79899 Other long term (current) drug therapy: Secondary | ICD-10-CM | POA: Diagnosis not present

## 2021-01-11 DIAGNOSIS — E039 Hypothyroidism, unspecified: Secondary | ICD-10-CM | POA: Diagnosis not present

## 2021-01-11 DIAGNOSIS — E119 Type 2 diabetes mellitus without complications: Secondary | ICD-10-CM | POA: Diagnosis not present

## 2021-01-11 DIAGNOSIS — R1030 Lower abdominal pain, unspecified: Secondary | ICD-10-CM | POA: Insufficient documentation

## 2021-01-11 DIAGNOSIS — I10 Essential (primary) hypertension: Secondary | ICD-10-CM | POA: Insufficient documentation

## 2021-01-11 DIAGNOSIS — I700 Atherosclerosis of aorta: Secondary | ICD-10-CM | POA: Diagnosis not present

## 2021-01-11 DIAGNOSIS — M549 Dorsalgia, unspecified: Secondary | ICD-10-CM | POA: Diagnosis not present

## 2021-01-11 DIAGNOSIS — W07XXXA Fall from chair, initial encounter: Secondary | ICD-10-CM | POA: Insufficient documentation

## 2021-01-11 DIAGNOSIS — S32049A Unspecified fracture of fourth lumbar vertebra, initial encounter for closed fracture: Secondary | ICD-10-CM | POA: Diagnosis not present

## 2021-01-11 DIAGNOSIS — M25551 Pain in right hip: Secondary | ICD-10-CM | POA: Insufficient documentation

## 2021-01-11 DIAGNOSIS — M1612 Unilateral primary osteoarthritis, left hip: Secondary | ICD-10-CM | POA: Diagnosis not present

## 2021-01-11 DIAGNOSIS — M25552 Pain in left hip: Secondary | ICD-10-CM | POA: Diagnosis not present

## 2021-01-11 DIAGNOSIS — R531 Weakness: Secondary | ICD-10-CM | POA: Diagnosis not present

## 2021-01-11 DIAGNOSIS — R911 Solitary pulmonary nodule: Secondary | ICD-10-CM | POA: Diagnosis not present

## 2021-01-11 DIAGNOSIS — R52 Pain, unspecified: Secondary | ICD-10-CM

## 2021-01-11 DIAGNOSIS — M545 Low back pain, unspecified: Secondary | ICD-10-CM | POA: Diagnosis not present

## 2021-01-11 DIAGNOSIS — S3992XA Unspecified injury of lower back, initial encounter: Secondary | ICD-10-CM | POA: Diagnosis present

## 2021-01-11 DIAGNOSIS — M1611 Unilateral primary osteoarthritis, right hip: Secondary | ICD-10-CM | POA: Diagnosis not present

## 2021-01-11 DIAGNOSIS — R109 Unspecified abdominal pain: Secondary | ICD-10-CM | POA: Diagnosis not present

## 2021-01-11 DIAGNOSIS — M546 Pain in thoracic spine: Secondary | ICD-10-CM | POA: Diagnosis not present

## 2021-01-11 LAB — CBC WITH DIFFERENTIAL/PLATELET
Abs Immature Granulocytes: 0.04 10*3/uL (ref 0.00–0.07)
Basophils Absolute: 0 10*3/uL (ref 0.0–0.1)
Basophils Relative: 0 %
Eosinophils Absolute: 0 10*3/uL (ref 0.0–0.5)
Eosinophils Relative: 0 %
HCT: 43.3 % (ref 36.0–46.0)
Hemoglobin: 13.7 g/dL (ref 12.0–15.0)
Immature Granulocytes: 0 %
Lymphocytes Relative: 15 %
Lymphs Abs: 1.5 10*3/uL (ref 0.7–4.0)
MCH: 29.5 pg (ref 26.0–34.0)
MCHC: 31.6 g/dL (ref 30.0–36.0)
MCV: 93.3 fL (ref 80.0–100.0)
Monocytes Absolute: 0.8 10*3/uL (ref 0.1–1.0)
Monocytes Relative: 8 %
Neutro Abs: 7.6 10*3/uL (ref 1.7–7.7)
Neutrophils Relative %: 77 %
Platelets: 264 10*3/uL (ref 150–400)
RBC: 4.64 MIL/uL (ref 3.87–5.11)
RDW: 13 % (ref 11.5–15.5)
WBC: 10.1 10*3/uL (ref 4.0–10.5)
nRBC: 0 % (ref 0.0–0.2)

## 2021-01-11 LAB — URINALYSIS, ROUTINE W REFLEX MICROSCOPIC
Bacteria, UA: NONE SEEN
Bilirubin Urine: NEGATIVE
Glucose, UA: NEGATIVE mg/dL
Hgb urine dipstick: NEGATIVE
Ketones, ur: 5 mg/dL — AB
Nitrite: NEGATIVE
Protein, ur: NEGATIVE mg/dL
Specific Gravity, Urine: 1.02 (ref 1.005–1.030)
pH: 6 (ref 5.0–8.0)

## 2021-01-11 LAB — COMPREHENSIVE METABOLIC PANEL
ALT: 15 U/L (ref 0–44)
AST: 19 U/L (ref 15–41)
Albumin: 3.6 g/dL (ref 3.5–5.0)
Alkaline Phosphatase: 112 U/L (ref 38–126)
Anion gap: 9 (ref 5–15)
BUN: 7 mg/dL — ABNORMAL LOW (ref 8–23)
CO2: 23 mmol/L (ref 22–32)
Calcium: 9.2 mg/dL (ref 8.9–10.3)
Chloride: 106 mmol/L (ref 98–111)
Creatinine, Ser: 0.46 mg/dL (ref 0.44–1.00)
GFR, Estimated: >60 mL/min (ref >60)
Glucose, Bld: 122 mg/dL — ABNORMAL HIGH (ref 70–99)
Potassium: 3.7 mmol/L (ref 3.5–5.1)
Sodium: 138 mmol/L (ref 135–145)
Total Bilirubin: 0.5 mg/dL (ref 0.3–1.2)
Total Protein: 7.7 g/dL (ref 6.5–8.1)

## 2021-01-11 MED ORDER — FENTANYL CITRATE (PF) 100 MCG/2ML IJ SOLN
25.0000 ug | Freq: Once | INTRAMUSCULAR | Status: AC
  Administered 2021-01-11: 25 ug via INTRAVENOUS
  Filled 2021-01-11: qty 2

## 2021-01-11 MED ORDER — TRAMADOL HCL 50 MG PO TABS: 25.0000 mg | ORAL_TABLET | Freq: Three times a day (TID) | ORAL | 0 refills | Status: DC | PRN

## 2021-01-11 MED ORDER — IOHEXOL 300 MG/ML  SOLN
100.0000 mL | Freq: Once | INTRAMUSCULAR | Status: AC | PRN
  Administered 2021-01-11: 100 mL via INTRAVENOUS

## 2021-01-11 MED ORDER — TRAMADOL HCL 50 MG PO TABS
50.0000 mg | ORAL_TABLET | Freq: Once | ORAL | Status: AC
  Administered 2021-01-11: 50 mg via ORAL
  Filled 2021-01-11: qty 1

## 2021-01-11 NOTE — Discharge Instructions (Signed)
You had CT scans performed in the emergency department today. The CT scan showed that broken bone in your lower back. You may wear the corset for comfort. Please follow-up with Dr. Saintclair Halsted with neurosurgery for recheck. Your CT scan also showed nodules in your lungs that will need further evaluation by your family doctor.

## 2021-01-11 NOTE — Telephone Encounter (Signed)
Home health ordered.

## 2021-01-11 NOTE — ED Notes (Signed)
Patient transported to CT 

## 2021-01-11 NOTE — ED Triage Notes (Signed)
Pt presents to the ED with increased back pain and weakness for 5 days. Pt able to stand and pivot with stand by assistance.

## 2021-01-11 NOTE — ED Notes (Signed)
Patient states that she has two back braces back home and refuses "to pay for another one".

## 2021-01-11 NOTE — ED Notes (Signed)
Ortho tech at bedside at this time 

## 2021-01-11 NOTE — ED Notes (Signed)
Patient and son given discharge paperwork and instructions. Verbalized understanding of teaching. IV d/c with cath tip intact. Wheeled to exit in NAD.

## 2021-01-11 NOTE — Care Management (Signed)
ED RNCM met with patient and son [name] Cozart [phone].  States, he assist in patient's care at home. Discussed HH services son and patient are agreeable. ED RNCM explained that we send the referral to several [facility] agencies which accepts patient's health insurance, and will follow up to see which accepted the referral tomorrow.  [facility], [facility], [facility], and [facility].  Patient and family verbalized understanding. Updated EDP on plan.

## 2021-01-11 NOTE — ED Provider Notes (Signed)
[facility] EMERGENCY DEPARTMENT Provider Note   CSN: [id number] Arrival date & time: 01/11/21  1149     History Chief Complaint  Patient presents with  . Back Pain    Linda Copeland is a 87 y.o. female.  HPI      87 year old female with history of hypertension, diabetes, hypothyroidism, lumbar compression fracture, cervical radiculopathy, history of subdural hematoma, osteoporosis, cognitive impairment, open-angle glaucoma who presents with concern for difficulty walking with back pain.  Son reports that she typically will walk independently around the trailer, however the last 5 days, she has been unable to walk due to severe pain.  Patient reports that she has pain in her back, pointing initially to an area of her low left back with radiation down her left leg, but then reports she does have some pain in the right side as well.  Reports bilateral hip pain.  Reports the pain is worse when she attempts to stand to walk.  She does not feel weakness in her legs, but reports the pain makes it difficult for her to walk.  Reports that it is worse on the left side, with pain radiating down that side.  Denies numbness.  Son reports she had a fall where she went to sit down and missed the chair approximately 1 to 2 months ago, but no recent falls.  She reports she just feels "worn out."  Reports that she has frequent urinary symptoms, and son confirms that this has been present chronically and she has seen a urologist regarding this.  She denies nausea, vomiting, black or bloody stools, cough, shortness of breath, chest pain, headache, she denies feeling lightheaded or dizzy.  She was started on celecoxib approximately 10 days ago, and had some loose stool approximately 3 days ago that they attributed to that medication.  The son thought her symptoms of more difficulty walking were due to the medication and stopped it.  Past Medical History:  Diagnosis Date  . Arthritis     bilateral severe facet L4-L5, L5-S1  . Carpal tunnel syndrome of right wrist   . Chronic cervical radiculopathy    C5  . Compression fracture of lumbar vertebra (HCC)    L2-L3  . Environmental and seasonal allergies   . Frequent urination   . Hematoma of frontal scalp 10/06/2016  . History of bronchitis   . History of humerus fracture 10/06/2016   Right proximal  . History of subdural hematoma 10/06/2016   Left falcotentorial   . HOH (hard of hearing)   . Hypertension   . Hypothyroidism   . Nasal fracture 10/06/2016  . OA (osteoarthritis)    AC glenohumeral joinr  . Osteopenia   . Spinal stenosis at L4-L5 level     Patient Active Problem List   Diagnosis Date Noted  . Primary open angle glaucoma (POAG) of both eyes, severe stage 03/21/2019  . Closed fracture of nasal bone with routine healing 10/11/2016  . Subdural hematoma ([place]) 10/05/2016    Past Surgical History:  Procedure Laterality Date  . CARPAL TUNNEL RELEASE Right 01/22/2018   Procedure: RIGHT CARPAL TUNNEL RELEASE;  Surgeon: Daryll Brod, MD;  Location: [facility];  Service: Orthopedics;  Laterality: Right;  . COLONOSCOPY       OB History   No obstetric history on file.     Family History  Problem Relation Age of Onset  . Diabetes Son     Social History   Tobacco Use  .  Smoking status: Never Smoker  . Smokeless tobacco: Never Used  Vaping Use  . Vaping Use: Never used  Substance Use Topics  . Alcohol use: Not Currently    Comment: rare   . Drug use: Never    Home Medications Prior to Admission medications   Medication Sig Start Date End Date Taking? Authorizing Provider  traMADol (ULTRAM) 50 MG tablet Take 0.5-1 tablets (25-50 mg total) by mouth every 8 (eight) hours as needed. 01/11/21  Yes Quintella Reichert, MD  brimonidine [facility] 0.2 % ophthalmic solution INT 1 GTT IN OU TID 04/18/19   [provider]  dorzolamide-timolol (COSOPT) 22.3-6.8 MG/ML ophthalmic solution  Place 1 drop into the right eye 2 (two) times daily. 09/09/16   [provider]  ibandronate (BONIVA) 150 MG tablet TK 1 T PO QAM WITH 8 OUNCES OF WATER. DO NOT EAT OR LIE DOWN FOR 30 MINUTES AFTER TAKING 01/26/19   [provider]  levothyroxine (SYNTHROID, LEVOTHROID) 100 MCG tablet Take 100 mcg by mouth daily before breakfast. 09/09/16   [provider]  lisinopril (PRINIVIL,ZESTRIL) 20 MG tablet Take 20 mg by mouth daily. 09/09/16   [provider]  LUMIGAN 0.01 % SOLN Place 1 drop into both eyes at bedtime.  03/18/18   [provider]  RHOPRESSA 0.02 % SOLN INT 1 GTT IN OU Q NIGHT 03/21/19   [provider]    Allergies    Codeine, Biaxin [clarithromycin], and Caffeine  Review of Systems   Review of Systems  Constitutional: Negative for fever. Fatigue: "wore out"   HENT: Negative for sore throat.   Eyes: Negative for visual disturbance.  Respiratory: Negative for cough and shortness of breath.   Cardiovascular: Negative for chest pain.  Gastrointestinal: Negative for abdominal pain, constipation, diarrhea (did have loose stool 3 days ago but better now), nausea and vomiting.  Genitourinary: Positive for flank pain and frequency (unchanged per son). Negative for difficulty urinating.  Musculoskeletal: Positive for arthralgias, back pain and gait problem. Negative for neck pain.  Skin: Negative for rash.  Neurological: Negative for syncope, facial asymmetry, weakness, light-headedness, numbness and headaches.    Physical Exam Updated Vital Signs BP (!) 164/93   Pulse 71   Temp 97.8 F (36.6 C) (Axillary)   Resp 18   SpO2 93%   Physical Exam Vitals and nursing note reviewed.  Constitutional:      General: She is not in acute distress.    Appearance: She is well-developed. She is not diaphoretic.  HENT:     Head: Normocephalic and atraumatic.  Eyes:     Conjunctiva/sclera: Conjunctivae normal.  Cardiovascular:     Rate and  Rhythm: Normal rate and regular rhythm.     Heart sounds: Normal heart sounds. No murmur heard. No friction rub. No gallop.   Pulmonary:     Effort: Pulmonary effort is normal. No respiratory distress.     Breath sounds: Normal breath sounds. No wheezing or rales.  Abdominal:     General: There is no distension.     Palpations: Abdomen is soft.     Tenderness: There is abdominal tenderness (suprapubic). There is no guarding.  Musculoskeletal:        General: Tenderness (left lower back/flank\) present.     Cervical back: Normal range of motion.     Right lower leg: No edema.     Left lower leg: No edema.     Comments: 2+ bilateral LE pulses, dominant PT left  Skin:  General: Skin is warm and dry.     Findings: No erythema or rash.  Neurological:     Mental Status: She is alert and oriented to person, place, and time.     Comments: Normal strength bilalteral lower extremities hip flexion/knee flexion/extension/ankle flexion/extension     ED Results / Procedures / Treatments   Labs (all labs ordered are listed, but only abnormal results are displayed) Labs Reviewed  COMPREHENSIVE METABOLIC PANEL - Abnormal; Notable for the following components:      Result Value   Glucose, Bld 122 (*)    BUN 7 (*)    All other components within normal limits  URINALYSIS, ROUTINE W REFLEX MICROSCOPIC - Abnormal; Notable for the following components:   Ketones, ur 5 (*)    Leukocytes,Ua TRACE (*)    All other components within normal limits  CBC WITH DIFFERENTIAL/PLATELET    EKG EKG Interpretation  Date/Time:  Tuesday January 11 2021 15:21:06 EDT Ventricular Rate:  93 PR Interval:  193 QRS Duration: 124 QT Interval:  357 QTC Calculation: 444 R Axis:   89 Text Interpretation: Sinus rhythm Right bundle branch block No acute changes No significant change since last tracing Confirmed by Varney Biles [phone]) on 01/12/2021 9:06:51 PM   Radiology DG Thoracic Spine 2 View  Result Date:  01/11/2021 CLINICAL DATA:  Increased back pain and weakness for 5 days EXAM: THORACIC SPINE 2 VIEWS COMPARISON:  Chest radiographs, 07/09/2019 FINDINGS: Redemonstrated high-grade anterior wedge deformities of T12 and L1, with severe focal kyphosis of the thoracolumbar spine at T11-T12. This is unchanged in appearance compared to prior chest radiographs dated 07/09/2019. There is otherwise generally mild multilevel disc space height loss and osteophytosis throughout. IMPRESSION: Redemonstrated high-grade anterior wedge deformities of T12 and L1, with severe focal kyphosis of the thoracolumbar spine at T11-T12. This is unchanged in appearance compared to prior chest radiographs dated 07/09/2019. Electronically Signed   By: Eddie Candle M.D.   On: 01/11/2021 14:47   CT Chest Wo Contrast  Result Date: 01/11/2021 CLINICAL DATA:  Chest and abdominal pain. EXAM: CT CHEST WITHOUT CONTRAST, CT ABDOMEN AND PELVIS WITH CONTRAST TECHNIQUE: Multidetector CT imaging of the chest was performed following the standard protocol without IV contrast. CT abdomen pelvis was performed after the bolus infusion of contrast material. CONTRAST:  100 cc Omnipaque 300 COMPARISON:  None. FINDINGS: Cardiovascular: The heart is normal in size. No pericardial effusion. There is moderate tortuosity, mild ectasia and moderate calcification of the thoracic aorta. No focal aneurysm. Fairly extensive three-vessel coronary artery calcifications are noted. Mediastinum/Nodes: No mediastinal or hilar mass or adenopathy. The esophagus is grossly normal. Lungs/Pleura: Numerous bilateral pulmonary nodules consistent with pulmonary metastatic disease. No pulmonary mass lesions. No acute pulmonary infiltrates. No pleural effusions or pleural nodules. Musculoskeletal: No significant findings. Remote lower thoracic compression fractures are noted. CT ABDOMEN AND PELVIS: Hepatobiliary: No hepatic lesions or intrahepatic biliary dilatation. No common bile duct  dilatation. Pancreas: No mass, inflammation or ductal dilatation. Spleen: Normal size. No focal lesions. Adrenals/Urinary Tract: The adrenal glands and kidneys are unremarkable. The bladder is unremarkable. Stomach/Bowel: The stomach, duodenum, small bowel and colon are grossly normal. Vascular/Lymphatic: Atherosclerotic calcifications and moderate tortuosity of the aorta and iliac arteries but no aneurysm or dissection. The branch vessels are patent. The major venous structures are patent. No mesenteric or retroperitoneal mass or adenopathy. Reproductive: Surgically absent. Other: No pelvic mass or adenopathy. No free pelvic fluid collections. No inguinal mass or adenopathy. No abdominal wall hernia  or subcutaneous lesions. Musculoskeletal: Remote lumbar compression fractures with suspected acute or subacute L4 fracture. Both hips are normally located. No hip fracture or pelvic fracture. IMPRESSION: 1. Numerous bilateral pulmonary nodules consistent with pulmonary metastatic disease. No obvious primary. 2. No mediastinal or hilar mass or adenopathy. 3. Advanced atherosclerotic calcifications involving the thoracic and abdominal aorta and iliac arteries but no aneurysm or dissection. 4. No acute abdominal/pelvic findings, mass lesions or lymphadenopathy. 5. Remote thoracic and lumbar compression fractures with suspected acute or subacute L4 fracture. Aortic Atherosclerosis (ICD10-I70.0). Electronically Signed   By: Marijo Sanes M.D.   On: 01/11/2021 18:10   CT Abdomen Pelvis W Contrast  Result Date: 01/11/2021 CLINICAL DATA:  Chest and abdominal pain. EXAM: CT CHEST WITHOUT CONTRAST, CT ABDOMEN AND PELVIS WITH CONTRAST TECHNIQUE: Multidetector CT imaging of the chest was performed following the standard protocol without IV contrast. CT abdomen pelvis was performed after the bolus infusion of contrast material. CONTRAST:  100 cc Omnipaque 300 COMPARISON:  None. FINDINGS: Cardiovascular: The heart is normal in  size. No pericardial effusion. There is moderate tortuosity, mild ectasia and moderate calcification of the thoracic aorta. No focal aneurysm. Fairly extensive three-vessel coronary artery calcifications are noted. Mediastinum/Nodes: No mediastinal or hilar mass or adenopathy. The esophagus is grossly normal. Lungs/Pleura: Numerous bilateral pulmonary nodules consistent with pulmonary metastatic disease. No pulmonary mass lesions. No acute pulmonary infiltrates. No pleural effusions or pleural nodules. Musculoskeletal: No significant findings. Remote lower thoracic compression fractures are noted. CT ABDOMEN AND PELVIS: Hepatobiliary: No hepatic lesions or intrahepatic biliary dilatation. No common bile duct dilatation. Pancreas: No mass, inflammation or ductal dilatation. Spleen: Normal size. No focal lesions. Adrenals/Urinary Tract: The adrenal glands and kidneys are unremarkable. The bladder is unremarkable. Stomach/Bowel: The stomach, duodenum, small bowel and colon are grossly normal. Vascular/Lymphatic: Atherosclerotic calcifications and moderate tortuosity of the aorta and iliac arteries but no aneurysm or dissection. The branch vessels are patent. The major venous structures are patent. No mesenteric or retroperitoneal mass or adenopathy. Reproductive: Surgically absent. Other: No pelvic mass or adenopathy. No free pelvic fluid collections. No inguinal mass or adenopathy. No abdominal wall hernia or subcutaneous lesions. Musculoskeletal: Remote lumbar compression fractures with suspected acute or subacute L4 fracture. Both hips are normally located. No hip fracture or pelvic fracture. IMPRESSION: 1. Numerous bilateral pulmonary nodules consistent with pulmonary metastatic disease. No obvious primary. 2. No mediastinal or hilar mass or adenopathy. 3. Advanced atherosclerotic calcifications involving the thoracic and abdominal aorta and iliac arteries but no aneurysm or dissection. 4. No acute abdominal/pelvic  findings, mass lesions or lymphadenopathy. 5. Remote thoracic and lumbar compression fractures with suspected acute or subacute L4 fracture. Aortic Atherosclerosis (ICD10-I70.0). Electronically Signed   By: Marijo Sanes M.D.   On: 01/11/2021 18:10   CT L-SPINE NO CHARGE  Result Date: 01/11/2021 CLINICAL DATA:  Abdominal pain. EXAM: CT LUMBAR SPINE WITHOUT CONTRAST TECHNIQUE: Multidetector CT imaging of the lumbar spine was performed without intravenous contrast administration. Multiplanar CT image reconstructions were also generated. COMPARISON:  Lumbar spine MRI 06/20/2018 FINDINGS: Segmentation: There are five lumbar type vertebral bodies. The last full intervertebral disc space is labeled L5-S1. Alignment: Grossly normal. Vertebrae: Advanced osteoporosis. There are remote appearing compression fractures of T12, L1, L2 and L3. Suspect acute or subacute compression fracture of L4 involving the superior endplate. There also appear to be bilateral pedicle fractures and there is mild retropulsion of the posterosuperior aspect of the vertebral body but no significant canal compromise. The facets are  intact and the spinous and transverse processes are intact. Paraspinal and other soft tissues: Mild paraspinal hematoma at L4. Disc levels: Degenerative lumbar spondylosis with multilevel disc disease and facet disease. Multilevel spinal and foraminal stenosis, most significant at L4-5. IMPRESSION: 1. Suspect acute or subacute compression fracture of L4 involving the superior endplate and bilateral pedicle fractures. Mild retropulsion of the posterosuperior aspect of the vertebral body. 2. Remote appearing compression fractures of T12, L1, L2 and L3. 3. Degenerative lumbar spondylosis with multilevel disc disease and facet disease. Multilevel spinal and foraminal stenosis, most significant at L4-5. Electronically Signed   By: Marijo Sanes M.D.   On: 01/11/2021 17:51   DG HIP UNILAT WITH PELVIS 2-3 VIEWS  LEFT  Result Date: 01/11/2021 CLINICAL DATA:  Back and hip pain, weakness EXAM: DG HIP (WITH OR WITHOUT PELVIS) 2-3V LEFT COMPARISON:  01/11/2021 FINDINGS: Bones are osteopenic. Intact left hip without fracture or malalignment. No subluxation or dislocation. Bony pelvis and hips appear symmetric and intact. Degenerative changes and scoliosis of the spine. Nonobstructive bowel gas pattern. Peripheral vascular calcifications noted. IMPRESSION: Osteopenia and degenerative changes. No acute osseous finding or displaced fracture Electronically Signed   By: Jerilynn Mages.  Shick M.D.   On: 01/11/2021 14:44   DG HIP UNILAT WITH PELVIS 2-3 VIEWS RIGHT  Result Date: 01/11/2021 CLINICAL DATA:  Back pain, weakness, hip pain EXAM: DG HIP (WITH OR WITHOUT PELVIS) 2-3V RIGHT COMPARISON:  01/11/2021 FINDINGS: Intact right hip without malalignment or fracture. No subluxation or dislocation. Bones are osteopenic. Degenerative changes of the spine and SI joints. Visualized pelvis intact. Peripheral vascular calcifications noted. IMPRESSION: Osteopenia and degenerative changes. No acute osseous finding or fracture by plain radiography. Electronically Signed   By: Jerilynn Mages.  Shick M.D.   On: 01/11/2021 14:45    Procedures Procedures   Medications Ordered in ED Medications  fentaNYL (SUBLIMAZE) injection 25 mcg (25 mcg Intravenous Given 01/11/21 1557)  iohexol (OMNIPAQUE) 300 MG/ML solution 100 mL (100 mLs Intravenous Contrast Given 01/11/21 1714)  traMADol (ULTRAM) tablet 50 mg (50 mg Oral Given 01/11/21 1936)    ED Course  I have reviewed the triage vital signs and the nursing notes.  Pertinent labs & imaging results that were available during my care of the patient were reviewed by me and considered in my medical decision making (see chart for details).    MDM Rules/Calculators/A&P                          87 year old female with history of hypertension, diabetes, hypothyroidism, lumbar compression fracture, cervical  radiculopathy, history of subdural hematoma, osteoporosis, cognitive impairment, open-angle glaucoma who presents with concern for difficulty walking with back pain.  No headache, no new neurologic symptoms, recent falls, low suspicion for intracranial or intracervical etiology of symptoms.    DDx includes pyelonephritis, AAA, retroperitoneal hematoma, MSK etiology such as disc herniation, worsening fracture.   Labs and CTA abdomen/pelvis ordered. Labs and UA without acute findings.  XR thoracic and hip show wedge deformities T12 and L1 which looked unchanged. CT pending at time of transfer of care.    Final Clinical Impression(s) / ED Diagnoses Final diagnoses:  Back pain  Compression fracture of L4 vertebra, initial encounter ([place])  Pulmonary nodule    Rx / DC Orders ED Discharge Orders         Ordered    traMADol (ULTRAM) 50 MG tablet  Every 8 hours PRN  01/11/21 Laverna Peace, MD 01/13/21 [phone]

## 2021-01-16 DIAGNOSIS — M810 Age-related osteoporosis without current pathological fracture: Secondary | ICD-10-CM | POA: Diagnosis not present

## 2021-01-16 DIAGNOSIS — S32048D Other fracture of fourth lumbar vertebra, subsequent encounter for fracture with routine healing: Secondary | ICD-10-CM | POA: Diagnosis not present

## 2021-01-16 DIAGNOSIS — M47817 Spondylosis without myelopathy or radiculopathy, lumbosacral region: Secondary | ICD-10-CM | POA: Diagnosis not present

## 2021-01-16 DIAGNOSIS — E1139 Type 2 diabetes mellitus with other diabetic ophthalmic complication: Secondary | ICD-10-CM | POA: Diagnosis not present

## 2021-01-16 DIAGNOSIS — H401133 Primary open-angle glaucoma, bilateral, severe stage: Secondary | ICD-10-CM | POA: Diagnosis not present

## 2021-01-16 DIAGNOSIS — J45909 Unspecified asthma, uncomplicated: Secondary | ICD-10-CM | POA: Diagnosis not present

## 2021-01-16 DIAGNOSIS — M48061 Spinal stenosis, lumbar region without neurogenic claudication: Secondary | ICD-10-CM | POA: Diagnosis not present

## 2021-01-16 DIAGNOSIS — M5412 Radiculopathy, cervical region: Secondary | ICD-10-CM | POA: Diagnosis not present

## 2021-01-16 DIAGNOSIS — I10 Essential (primary) hypertension: Secondary | ICD-10-CM | POA: Diagnosis not present

## 2021-01-18 DIAGNOSIS — E1139 Type 2 diabetes mellitus with other diabetic ophthalmic complication: Secondary | ICD-10-CM | POA: Diagnosis not present

## 2021-01-18 DIAGNOSIS — M5412 Radiculopathy, cervical region: Secondary | ICD-10-CM | POA: Diagnosis not present

## 2021-01-18 DIAGNOSIS — J45909 Unspecified asthma, uncomplicated: Secondary | ICD-10-CM | POA: Diagnosis not present

## 2021-01-18 DIAGNOSIS — M48061 Spinal stenosis, lumbar region without neurogenic claudication: Secondary | ICD-10-CM | POA: Diagnosis not present

## 2021-01-18 DIAGNOSIS — H401133 Primary open-angle glaucoma, bilateral, severe stage: Secondary | ICD-10-CM | POA: Diagnosis not present

## 2021-01-18 DIAGNOSIS — S32048D Other fracture of fourth lumbar vertebra, subsequent encounter for fracture with routine healing: Secondary | ICD-10-CM | POA: Diagnosis not present

## 2021-01-18 DIAGNOSIS — I10 Essential (primary) hypertension: Secondary | ICD-10-CM | POA: Diagnosis not present

## 2021-01-18 DIAGNOSIS — M47817 Spondylosis without myelopathy or radiculopathy, lumbosacral region: Secondary | ICD-10-CM | POA: Diagnosis not present

## 2021-01-18 DIAGNOSIS — M810 Age-related osteoporosis without current pathological fracture: Secondary | ICD-10-CM | POA: Diagnosis not present

## 2021-01-19 DIAGNOSIS — I10 Essential (primary) hypertension: Secondary | ICD-10-CM | POA: Diagnosis not present

## 2021-01-19 DIAGNOSIS — S32048D Other fracture of fourth lumbar vertebra, subsequent encounter for fracture with routine healing: Secondary | ICD-10-CM | POA: Diagnosis not present

## 2021-01-19 DIAGNOSIS — E1139 Type 2 diabetes mellitus with other diabetic ophthalmic complication: Secondary | ICD-10-CM | POA: Diagnosis not present

## 2021-01-19 DIAGNOSIS — M48061 Spinal stenosis, lumbar region without neurogenic claudication: Secondary | ICD-10-CM | POA: Diagnosis not present

## 2021-01-19 DIAGNOSIS — M810 Age-related osteoporosis without current pathological fracture: Secondary | ICD-10-CM | POA: Diagnosis not present

## 2021-01-19 DIAGNOSIS — H401133 Primary open-angle glaucoma, bilateral, severe stage: Secondary | ICD-10-CM | POA: Diagnosis not present

## 2021-01-19 DIAGNOSIS — M5412 Radiculopathy, cervical region: Secondary | ICD-10-CM | POA: Diagnosis not present

## 2021-01-19 DIAGNOSIS — M47817 Spondylosis without myelopathy or radiculopathy, lumbosacral region: Secondary | ICD-10-CM | POA: Diagnosis not present

## 2021-01-19 DIAGNOSIS — J45909 Unspecified asthma, uncomplicated: Secondary | ICD-10-CM | POA: Diagnosis not present

## 2021-01-20 DIAGNOSIS — M5412 Radiculopathy, cervical region: Secondary | ICD-10-CM | POA: Diagnosis not present

## 2021-01-20 DIAGNOSIS — S32048D Other fracture of fourth lumbar vertebra, subsequent encounter for fracture with routine healing: Secondary | ICD-10-CM | POA: Diagnosis not present

## 2021-01-20 DIAGNOSIS — H401133 Primary open-angle glaucoma, bilateral, severe stage: Secondary | ICD-10-CM | POA: Diagnosis not present

## 2021-01-20 DIAGNOSIS — J45909 Unspecified asthma, uncomplicated: Secondary | ICD-10-CM | POA: Diagnosis not present

## 2021-01-20 DIAGNOSIS — M48061 Spinal stenosis, lumbar region without neurogenic claudication: Secondary | ICD-10-CM | POA: Diagnosis not present

## 2021-01-20 DIAGNOSIS — M47817 Spondylosis without myelopathy or radiculopathy, lumbosacral region: Secondary | ICD-10-CM | POA: Diagnosis not present

## 2021-01-20 DIAGNOSIS — E1139 Type 2 diabetes mellitus with other diabetic ophthalmic complication: Secondary | ICD-10-CM | POA: Diagnosis not present

## 2021-01-20 DIAGNOSIS — I10 Essential (primary) hypertension: Secondary | ICD-10-CM | POA: Diagnosis not present

## 2021-01-20 DIAGNOSIS — M810 Age-related osteoporosis without current pathological fracture: Secondary | ICD-10-CM | POA: Diagnosis not present

## 2021-01-21 DIAGNOSIS — M47817 Spondylosis without myelopathy or radiculopathy, lumbosacral region: Secondary | ICD-10-CM | POA: Diagnosis not present

## 2021-01-21 DIAGNOSIS — M5412 Radiculopathy, cervical region: Secondary | ICD-10-CM | POA: Diagnosis not present

## 2021-01-21 DIAGNOSIS — E1139 Type 2 diabetes mellitus with other diabetic ophthalmic complication: Secondary | ICD-10-CM | POA: Diagnosis not present

## 2021-01-21 DIAGNOSIS — I10 Essential (primary) hypertension: Secondary | ICD-10-CM | POA: Diagnosis not present

## 2021-01-21 DIAGNOSIS — H401133 Primary open-angle glaucoma, bilateral, severe stage: Secondary | ICD-10-CM | POA: Diagnosis not present

## 2021-01-21 DIAGNOSIS — J45909 Unspecified asthma, uncomplicated: Secondary | ICD-10-CM | POA: Diagnosis not present

## 2021-01-21 DIAGNOSIS — M48061 Spinal stenosis, lumbar region without neurogenic claudication: Secondary | ICD-10-CM | POA: Diagnosis not present

## 2021-01-21 DIAGNOSIS — M810 Age-related osteoporosis without current pathological fracture: Secondary | ICD-10-CM | POA: Diagnosis not present

## 2021-01-21 DIAGNOSIS — S32048D Other fracture of fourth lumbar vertebra, subsequent encounter for fracture with routine healing: Secondary | ICD-10-CM | POA: Diagnosis not present

## 2021-01-27 DIAGNOSIS — M47817 Spondylosis without myelopathy or radiculopathy, lumbosacral region: Secondary | ICD-10-CM | POA: Diagnosis not present

## 2021-01-27 DIAGNOSIS — M5412 Radiculopathy, cervical region: Secondary | ICD-10-CM | POA: Diagnosis not present

## 2021-01-27 DIAGNOSIS — H401133 Primary open-angle glaucoma, bilateral, severe stage: Secondary | ICD-10-CM | POA: Diagnosis not present

## 2021-01-27 DIAGNOSIS — E1139 Type 2 diabetes mellitus with other diabetic ophthalmic complication: Secondary | ICD-10-CM | POA: Diagnosis not present

## 2021-01-27 DIAGNOSIS — I10 Essential (primary) hypertension: Secondary | ICD-10-CM | POA: Diagnosis not present

## 2021-01-27 DIAGNOSIS — S32048D Other fracture of fourth lumbar vertebra, subsequent encounter for fracture with routine healing: Secondary | ICD-10-CM | POA: Diagnosis not present

## 2021-01-27 DIAGNOSIS — M810 Age-related osteoporosis without current pathological fracture: Secondary | ICD-10-CM | POA: Diagnosis not present

## 2021-01-27 DIAGNOSIS — M48061 Spinal stenosis, lumbar region without neurogenic claudication: Secondary | ICD-10-CM | POA: Diagnosis not present

## 2021-01-27 DIAGNOSIS — J45909 Unspecified asthma, uncomplicated: Secondary | ICD-10-CM | POA: Diagnosis not present

## 2021-01-28 DIAGNOSIS — M810 Age-related osteoporosis without current pathological fracture: Secondary | ICD-10-CM | POA: Diagnosis not present

## 2021-01-28 DIAGNOSIS — S32048D Other fracture of fourth lumbar vertebra, subsequent encounter for fracture with routine healing: Secondary | ICD-10-CM | POA: Diagnosis not present

## 2021-01-28 DIAGNOSIS — H401133 Primary open-angle glaucoma, bilateral, severe stage: Secondary | ICD-10-CM | POA: Diagnosis not present

## 2021-01-28 DIAGNOSIS — M48061 Spinal stenosis, lumbar region without neurogenic claudication: Secondary | ICD-10-CM | POA: Diagnosis not present

## 2021-01-28 DIAGNOSIS — E1139 Type 2 diabetes mellitus with other diabetic ophthalmic complication: Secondary | ICD-10-CM | POA: Diagnosis not present

## 2021-01-28 DIAGNOSIS — M47817 Spondylosis without myelopathy or radiculopathy, lumbosacral region: Secondary | ICD-10-CM | POA: Diagnosis not present

## 2021-01-28 DIAGNOSIS — M5412 Radiculopathy, cervical region: Secondary | ICD-10-CM | POA: Diagnosis not present

## 2021-01-28 DIAGNOSIS — J45909 Unspecified asthma, uncomplicated: Secondary | ICD-10-CM | POA: Diagnosis not present

## 2021-01-28 DIAGNOSIS — I10 Essential (primary) hypertension: Secondary | ICD-10-CM | POA: Diagnosis not present

## 2021-01-31 DIAGNOSIS — M48061 Spinal stenosis, lumbar region without neurogenic claudication: Secondary | ICD-10-CM | POA: Diagnosis not present

## 2021-01-31 DIAGNOSIS — M810 Age-related osteoporosis without current pathological fracture: Secondary | ICD-10-CM | POA: Diagnosis not present

## 2021-01-31 DIAGNOSIS — M5412 Radiculopathy, cervical region: Secondary | ICD-10-CM | POA: Diagnosis not present

## 2021-01-31 DIAGNOSIS — I10 Essential (primary) hypertension: Secondary | ICD-10-CM | POA: Diagnosis not present

## 2021-01-31 DIAGNOSIS — S32048D Other fracture of fourth lumbar vertebra, subsequent encounter for fracture with routine healing: Secondary | ICD-10-CM | POA: Diagnosis not present

## 2021-01-31 DIAGNOSIS — H401133 Primary open-angle glaucoma, bilateral, severe stage: Secondary | ICD-10-CM | POA: Diagnosis not present

## 2021-01-31 DIAGNOSIS — E1139 Type 2 diabetes mellitus with other diabetic ophthalmic complication: Secondary | ICD-10-CM | POA: Diagnosis not present

## 2021-01-31 DIAGNOSIS — J45909 Unspecified asthma, uncomplicated: Secondary | ICD-10-CM | POA: Diagnosis not present

## 2021-01-31 DIAGNOSIS — M47817 Spondylosis without myelopathy or radiculopathy, lumbosacral region: Secondary | ICD-10-CM | POA: Diagnosis not present

## 2021-02-01 DIAGNOSIS — E1139 Type 2 diabetes mellitus with other diabetic ophthalmic complication: Secondary | ICD-10-CM | POA: Diagnosis not present

## 2021-02-01 DIAGNOSIS — I10 Essential (primary) hypertension: Secondary | ICD-10-CM | POA: Diagnosis not present

## 2021-02-01 DIAGNOSIS — S32048D Other fracture of fourth lumbar vertebra, subsequent encounter for fracture with routine healing: Secondary | ICD-10-CM | POA: Diagnosis not present

## 2021-02-01 DIAGNOSIS — M810 Age-related osteoporosis without current pathological fracture: Secondary | ICD-10-CM | POA: Diagnosis not present

## 2021-02-01 DIAGNOSIS — J45909 Unspecified asthma, uncomplicated: Secondary | ICD-10-CM | POA: Diagnosis not present

## 2021-02-01 DIAGNOSIS — M47817 Spondylosis without myelopathy or radiculopathy, lumbosacral region: Secondary | ICD-10-CM | POA: Diagnosis not present

## 2021-02-01 DIAGNOSIS — M48061 Spinal stenosis, lumbar region without neurogenic claudication: Secondary | ICD-10-CM | POA: Diagnosis not present

## 2021-02-01 DIAGNOSIS — M5412 Radiculopathy, cervical region: Secondary | ICD-10-CM | POA: Diagnosis not present

## 2021-02-01 DIAGNOSIS — H401133 Primary open-angle glaucoma, bilateral, severe stage: Secondary | ICD-10-CM | POA: Diagnosis not present

## 2021-02-02 ENCOUNTER — Telehealth: Payer: Self-pay | Admitting: Hematology and Oncology

## 2021-02-02 NOTE — Telephone Encounter (Signed)
Received a new pt referral from Dr. Marisue Humble for Pulmonary nodules on 4/22 CTsuggestive of possible metastatic disease. Linda Copeland has been scheduled to see Dr. Lorenso Courier on 5/6 at 1pm. Appt date and time has been provided to the pt's son. Aware to arrive 20 minutes early.

## 2021-02-04 ENCOUNTER — Inpatient Hospital Stay: Payer: Medicare HMO

## 2021-02-04 ENCOUNTER — Inpatient Hospital Stay: Payer: Medicare HMO | Attending: Hematology and Oncology | Admitting: Hematology and Oncology

## 2021-02-04 ENCOUNTER — Other Ambulatory Visit: Payer: Self-pay

## 2021-02-04 ENCOUNTER — Encounter: Payer: Self-pay | Admitting: Hematology and Oncology

## 2021-02-04 VITALS — BP 161/81 | HR 79 | Temp 97.7°F | Resp 17 | Ht <= 58 in | Wt 120.7 lb

## 2021-02-04 DIAGNOSIS — Z79899 Other long term (current) drug therapy: Secondary | ICD-10-CM | POA: Insufficient documentation

## 2021-02-04 DIAGNOSIS — R918 Other nonspecific abnormal finding of lung field: Secondary | ICD-10-CM | POA: Diagnosis not present

## 2021-02-04 DIAGNOSIS — M8580 Other specified disorders of bone density and structure, unspecified site: Secondary | ICD-10-CM | POA: Insufficient documentation

## 2021-02-04 DIAGNOSIS — I10 Essential (primary) hypertension: Secondary | ICD-10-CM | POA: Insufficient documentation

## 2021-02-04 DIAGNOSIS — E039 Hypothyroidism, unspecified: Secondary | ICD-10-CM | POA: Diagnosis not present

## 2021-02-04 NOTE — Progress Notes (Signed)
[place] [phone]   Fax:([phone]) [place] NOTE  Patient Care Team: Gaynelle Arabian, MD as PCP - General (Family Medicine)  Hematological/Oncological History # Bilateral Pulmonary Nodules 01/11/2021: CT C/A/P showed numerous bilateral pulmonary nodules consistent with pulmonary metastatic disease. No obvious primary.  02/04/2021: establish care with Dr. Lorenso Courier   CHIEF COMPLAINTS/PURPOSE OF CONSULTATION:  "Bilateral Pulmonary Nodules "  HISTORY OF PRESENTING ILLNESS:  Linda Copeland 87 y.o. female with medical history significant for osteoarthritis, HTN, hypothyroidism, and osteopenia who presents for evaluation of bilateral pulmonary nodules.   On review of the previous records Linda Copeland had a CT chest abdomen pelvis performed on 01/11/2021 due to chest and abdominal pain.  The scan was unremarkable save for several pulmonary nodules noted bilaterally.  These were all less than 1 cm.  Due to concern for these findings the patient was referred to hematology for further evaluation management.  On exam today Linda Copeland is accompanied by her son. She is markedly hard of hearing.  She reports that she is not been having any issues with shortness of breath or cough.  She has had no recent weight loss or change in appetite.  She reports that her husband is a smoker and her son is currently smoker and that her husband used to smoke in the house.  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  She underwent colonoscopy and reportedly had a hysterectomy "long ago".  She denies ever having mammograms.  She currently denies any fevers, chills, sweats, nausea, vomiting or diarrhea.  A full 10 point ROS is listed below.  MEDICAL HISTORY:  Past Medical History:  Diagnosis Date  . Arthritis    bilateral severe facet L4-L5, L5-S1  . Carpal tunnel syndrome of  right wrist   . Chronic cervical radiculopathy    C5  . Compression fracture of lumbar vertebra (HCC)    L2-L3  . Environmental and seasonal allergies   . Frequent urination   . Hematoma of frontal scalp 10/06/2016  . History of bronchitis   . History of humerus fracture 10/06/2016   Right proximal  . History of subdural hematoma 10/06/2016   Left falcotentorial   . HOH (hard of hearing)   . Hypertension   . Hypothyroidism   . Nasal fracture 10/06/2016  . OA (osteoarthritis)    AC glenohumeral joinr  . Osteopenia   . Spinal stenosis at L4-L5 level     SURGICAL HISTORY: Past Surgical History:  Procedure Laterality Date  . CARPAL TUNNEL RELEASE Right 01/22/2018   Procedure: RIGHT CARPAL TUNNEL RELEASE;  Surgeon: Daryll Brod, MD;  Location: [facility];  Service: Orthopedics;  Laterality: Right;  . COLONOSCOPY      SOCIAL HISTORY: Social History   Socioeconomic History  . Marital status: Widowed    Spouse name: Not on file  . Number of children: Not on file  . Years of education: Not on file  . Highest education level: Not on file  Occupational History  . Not on file  Tobacco Use  . Smoking status: Never Smoker  . Smokeless tobacco: Never Used  Vaping Use  . Vaping Use: Never used  Substance and Sexual Activity  . Alcohol use: Not Currently    Comment: rare   . Drug use: Never  . Sexual activity: Not on file  Other Topics Concern  .  Not on file  Social History Narrative  . Not on file   Social Determinants of Health   Financial Resource Strain: Not on file  Food Insecurity: Not on file  Transportation Needs: Not on file  Physical Activity: Not on file  Stress: Not on file  Social Connections: Not on file  Intimate Partner Violence: Not on file    FAMILY HISTORY: Family History  Problem Relation Age of Onset  . Diabetes Son     ALLERGIES:  is allergic to codeine, biaxin [clarithromycin], and caffeine.  MEDICATIONS:  Current  Outpatient Medications  Medication Sig Dispense Refill  . brimonidine (ALPHAGAN) 0.2 % ophthalmic solution INT 1 GTT IN OU TID    . dorzolamide-timolol (COSOPT) 22.3-6.8 MG/ML ophthalmic solution Place 1 drop into the right eye 2 (two) times daily.    Marland Kitchen ibandronate (BONIVA) 150 MG tablet TK 1 T PO QAM WITH 8 OUNCES OF WATER. DO NOT EAT OR LIE DOWN FOR 30 MINUTES AFTER TAKING    . levothyroxine (SYNTHROID, LEVOTHROID) 100 MCG tablet Take 100 mcg by mouth daily before breakfast.    . lisinopril (PRINIVIL,ZESTRIL) 20 MG tablet Take 20 mg by mouth daily.    Marland Kitchen LUMIGAN 0.01 % SOLN Place 1 drop into both eyes at bedtime.   0  . RHOPRESSA 0.02 % SOLN INT 1 GTT IN OU Q NIGHT    . traMADol (ULTRAM) 50 MG tablet Take 0.5-1 tablets (25-50 mg total) by mouth every 8 (eight) hours as needed. 10 tablet 0   No current facility-administered medications for this visit.    REVIEW OF SYSTEMS:   Constitutional: ( - ) fevers, ( - )  chills , ( - ) night sweats Eyes: ( - ) blurriness of vision, ( - ) double vision, ( - ) watery eyes Ears, nose, mouth, throat, and face: ( - ) mucositis, ( - ) sore throat Respiratory: ( - ) cough, ( - ) dyspnea, ( - ) wheezes Cardiovascular: ( - ) palpitation, ( - ) chest discomfort, ( - ) lower extremity swelling Gastrointestinal:  ( - ) nausea, ( - ) heartburn, ( - ) change in bowel habits Skin: ( - ) abnormal skin rashes Lymphatics: ( - ) new lymphadenopathy, ( - ) easy bruising Neurological: ( - ) numbness, ( - ) tingling, ( - ) new weaknesses Behavioral/Psych: ( - ) mood change, ( - ) new changes  All other systems were reviewed with the patient and are negative.  PHYSICAL EXAMINATION: Vitals:   02/04/21 1351  BP: (!) 161/81  Pulse: 79  Resp: 17  Temp: 97.7 F (36.5 C)  SpO2: 94%   Filed Weights   02/04/21 1351  Weight: 120 lb 11.2 oz (54.7 kg)    GENERAL: chronically ill appearing elderly Caucasian female in NAD  SKIN: skin color, texture, turgor are normal,  no rashes or significant lesions EYES: conjunctiva are pink and non-injected, sclera clear LUNGS: clear to auscultation and percussion with normal breathing effort HEART: regular rate & rhythm and no murmurs and no lower extremity edema Musculoskeletal: no cyanosis of digits and no clubbing  PSYCH: alert & oriented x 3, fluent speech NEURO: no focal motor/sensory deficits  LABORATORY DATA:  I have reviewed the data as listed CBC Latest Ref Rng & Units 01/11/2021 01/22/2018 10/06/2016  WBC 4.0 - 10.5 K/uL 10.1 - 12.8(H)  Hemoglobin 12.0 - 15.0 g/dL 13.7 13.6 12.8  Hematocrit 36.0 - 46.0 % 43.3 40.0 37.9  Platelets 150 -  400 K/uL 264 - 250    CMP Latest Ref Rng & Units 01/11/2021 01/22/2018 10/07/2016  Glucose 70 - 99 mg/dL 122(H) 99 148(H)  BUN 8 - 23 mg/dL 7(L) 8 7  Creatinine 0.44 - 1.00 mg/dL 0.46 0.40(L) 0.47  Sodium 135 - 145 mmol/L 138 140 135  Potassium 3.5 - 5.1 mmol/L 3.7 4.1 3.5  Chloride 98 - 111 mmol/L 106 104 103  CO2 22 - 32 mmol/L 23 - 22  Calcium 8.9 - 10.3 mg/dL 9.2 - 9.2  Total Protein 6.5 - 8.1 g/dL 7.7 - -  Total Bilirubin 0.3 - 1.2 mg/dL 0.5 - -  Alkaline Phos 38 - 126 U/L 112 - -  AST 15 - 41 U/L 19 - -  ALT 0 - 44 U/L 15 - -    RADIOGRAPHIC STUDIES: I have personally reviewed the radiological images as listed and agreed with the findings in the report: numerous subcentimeter pulmonary nodules.  DG Thoracic Spine 2 View  Result Date: 01/11/2021 CLINICAL DATA:  Increased back pain and weakness for 5 days EXAM: THORACIC SPINE 2 VIEWS COMPARISON:  Chest radiographs, 07/09/2019 FINDINGS: Redemonstrated high-grade anterior wedge deformities of T12 and L1, with severe focal kyphosis of the thoracolumbar spine at T11-T12. This is unchanged in appearance compared to prior chest radiographs dated 07/09/2019. There is otherwise generally mild multilevel disc space height loss and osteophytosis throughout. IMPRESSION: Redemonstrated high-grade anterior wedge deformities of T12  and L1, with severe focal kyphosis of the thoracolumbar spine at T11-T12. This is unchanged in appearance compared to prior chest radiographs dated 07/09/2019. Electronically Signed   By: Eddie Candle M.D.   On: 01/11/2021 14:47   CT Chest Wo Contrast  Result Date: 01/11/2021 CLINICAL DATA:  Chest and abdominal pain. EXAM: CT CHEST WITHOUT CONTRAST, CT ABDOMEN AND PELVIS WITH CONTRAST TECHNIQUE: Multidetector CT imaging of the chest was performed following the standard protocol without IV contrast. CT abdomen pelvis was performed after the bolus infusion of contrast material. CONTRAST:  100 cc Omnipaque 300 COMPARISON:  None. FINDINGS: Cardiovascular: The heart is normal in size. No pericardial effusion. There is moderate tortuosity, mild ectasia and moderate calcification of the thoracic aorta. No focal aneurysm. Fairly extensive three-vessel coronary artery calcifications are noted. Mediastinum/Nodes: No mediastinal or hilar mass or adenopathy. The esophagus is grossly normal. Lungs/Pleura: Numerous bilateral pulmonary nodules consistent with pulmonary metastatic disease. No pulmonary mass lesions. No acute pulmonary infiltrates. No pleural effusions or pleural nodules. Musculoskeletal: No significant findings. Remote lower thoracic compression fractures are noted. CT ABDOMEN AND PELVIS: Hepatobiliary: No hepatic lesions or intrahepatic biliary dilatation. No common bile duct dilatation. Pancreas: No mass, inflammation or ductal dilatation. Spleen: Normal size. No focal lesions. Adrenals/Urinary Tract: The adrenal glands and kidneys are unremarkable. The bladder is unremarkable. Stomach/Bowel: The stomach, duodenum, small bowel and colon are grossly normal. Vascular/Lymphatic: Atherosclerotic calcifications and moderate tortuosity of the aorta and iliac arteries but no aneurysm or dissection. The branch vessels are patent. The major venous structures are patent. No mesenteric or retroperitoneal mass or  adenopathy. Reproductive: Surgically absent. Other: No pelvic mass or adenopathy. No free pelvic fluid collections. No inguinal mass or adenopathy. No abdominal wall hernia or subcutaneous lesions. Musculoskeletal: Remote lumbar compression fractures with suspected acute or subacute L4 fracture. Both hips are normally located. No hip fracture or pelvic fracture. IMPRESSION: 1. Numerous bilateral pulmonary nodules consistent with pulmonary metastatic disease. No obvious primary. 2. No mediastinal or hilar mass or adenopathy. 3. Advanced atherosclerotic calcifications involving  the thoracic and abdominal aorta and iliac arteries but no aneurysm or dissection. 4. No acute abdominal/pelvic findings, mass lesions or lymphadenopathy. 5. Remote thoracic and lumbar compression fractures with suspected acute or subacute L4 fracture. Aortic Atherosclerosis (ICD10-I70.0). Electronically Signed   By: Marijo Sanes M.D.   On: 01/11/2021 18:10   CT Abdomen Pelvis W Contrast  Result Date: 01/11/2021 CLINICAL DATA:  Chest and abdominal pain. EXAM: CT CHEST WITHOUT CONTRAST, CT ABDOMEN AND PELVIS WITH CONTRAST TECHNIQUE: Multidetector CT imaging of the chest was performed following the standard protocol without IV contrast. CT abdomen pelvis was performed after the bolus infusion of contrast material. CONTRAST:  100 cc Omnipaque 300 COMPARISON:  None. FINDINGS: Cardiovascular: The heart is normal in size. No pericardial effusion. There is moderate tortuosity, mild ectasia and moderate calcification of the thoracic aorta. No focal aneurysm. Fairly extensive three-vessel coronary artery calcifications are noted. Mediastinum/Nodes: No mediastinal or hilar mass or adenopathy. The esophagus is grossly normal. Lungs/Pleura: Numerous bilateral pulmonary nodules consistent with pulmonary metastatic disease. No pulmonary mass lesions. No acute pulmonary infiltrates. No pleural effusions or pleural nodules. Musculoskeletal: No significant  findings. Remote lower thoracic compression fractures are noted. CT ABDOMEN AND PELVIS: Hepatobiliary: No hepatic lesions or intrahepatic biliary dilatation. No common bile duct dilatation. Pancreas: No mass, inflammation or ductal dilatation. Spleen: Normal size. No focal lesions. Adrenals/Urinary Tract: The adrenal glands and kidneys are unremarkable. The bladder is unremarkable. Stomach/Bowel: The stomach, duodenum, small bowel and colon are grossly normal. Vascular/Lymphatic: Atherosclerotic calcifications and moderate tortuosity of the aorta and iliac arteries but no aneurysm or dissection. The branch vessels are patent. The major venous structures are patent. No mesenteric or retroperitoneal mass or adenopathy. Reproductive: Surgically absent. Other: No pelvic mass or adenopathy. No free pelvic fluid collections. No inguinal mass or adenopathy. No abdominal wall hernia or subcutaneous lesions. Musculoskeletal: Remote lumbar compression fractures with suspected acute or subacute L4 fracture. Both hips are normally located. No hip fracture or pelvic fracture. IMPRESSION: 1. Numerous bilateral pulmonary nodules consistent with pulmonary metastatic disease. No obvious primary. 2. No mediastinal or hilar mass or adenopathy. 3. Advanced atherosclerotic calcifications involving the thoracic and abdominal aorta and iliac arteries but no aneurysm or dissection. 4. No acute abdominal/pelvic findings, mass lesions or lymphadenopathy. 5. Remote thoracic and lumbar compression fractures with suspected acute or subacute L4 fracture. Aortic Atherosclerosis (ICD10-I70.0). Electronically Signed   By: Marijo Sanes M.D.   On: 01/11/2021 18:10   CT L-SPINE NO CHARGE  Result Date: 01/11/2021 CLINICAL DATA:  Abdominal pain. EXAM: CT LUMBAR SPINE WITHOUT CONTRAST TECHNIQUE: Multidetector CT imaging of the lumbar spine was performed without intravenous contrast administration. Multiplanar CT image reconstructions were also  generated. COMPARISON:  Lumbar spine MRI 06/20/2018 FINDINGS: Segmentation: There are five lumbar type vertebral bodies. The last full intervertebral disc space is labeled L5-S1. Alignment: Grossly normal. Vertebrae: Advanced osteoporosis. There are remote appearing compression fractures of T12, L1, L2 and L3. Suspect acute or subacute compression fracture of L4 involving the superior endplate. There also appear to be bilateral pedicle fractures and there is mild retropulsion of the posterosuperior aspect of the vertebral body but no significant canal compromise. The facets are intact and the spinous and transverse processes are intact. Paraspinal and other soft tissues: Mild paraspinal hematoma at L4. Disc levels: Degenerative lumbar spondylosis with multilevel disc disease and facet disease. Multilevel spinal and foraminal stenosis, most significant at L4-5. IMPRESSION: 1. Suspect acute or subacute compression fracture of L4 involving the superior  endplate and bilateral pedicle fractures. Mild retropulsion of the posterosuperior aspect of the vertebral body. 2. Remote appearing compression fractures of T12, L1, L2 and L3. 3. Degenerative lumbar spondylosis with multilevel disc disease and facet disease. Multilevel spinal and foraminal stenosis, most significant at L4-5. Electronically Signed   By: Marijo Sanes M.D.   On: 01/11/2021 17:51   DG HIP UNILAT WITH PELVIS 2-3 VIEWS LEFT  Result Date: 01/11/2021 CLINICAL DATA:  Back and hip pain, weakness EXAM: DG HIP (WITH OR WITHOUT PELVIS) 2-3V LEFT COMPARISON:  01/11/2021 FINDINGS: Bones are osteopenic. Intact left hip without fracture or malalignment. No subluxation or dislocation. Bony pelvis and hips appear symmetric and intact. Degenerative changes and scoliosis of the spine. Nonobstructive bowel gas pattern. Peripheral vascular calcifications noted. IMPRESSION: Osteopenia and degenerative changes. No acute osseous finding or displaced fracture Electronically  Signed   By: Jerilynn Mages.  Shick M.D.   On: 01/11/2021 14:44   DG HIP UNILAT WITH PELVIS 2-3 VIEWS RIGHT  Result Date: 01/11/2021 CLINICAL DATA:  Back pain, weakness, hip pain EXAM: DG HIP (WITH OR WITHOUT PELVIS) 2-3V RIGHT COMPARISON:  01/11/2021 FINDINGS: Intact right hip without malalignment or fracture. No subluxation or dislocation. Bones are osteopenic. Degenerative changes of the spine and SI joints. Visualized pelvis intact. Peripheral vascular calcifications noted. IMPRESSION: Osteopenia and degenerative changes. No acute osseous finding or fracture by plain radiography. Electronically Signed   By: Jerilynn Mages.  Shick M.D.   On: 01/11/2021 14:45    ASSESSMENT & PLAN Linda Copeland 87 y.o. female with medical history significant for osteoarthritis, HTN, hypothyroidism, and osteopenia who presents for evaluation of bilateral pulmonary nodules.   At this time the etiology of her pulmonary nodules are not clear.  Additionally we do not have any prior CT imaging of the chest for comparison and therefore we do not know the chronicity of these lesions.  They do appear rounded and potentially recommend benign scarring of the lungs.  In order to further evaluate this we will perform a PET CT scan in order to look for primary disease or FDG activity in these nodules.  In the event that they are not FDG avid I recommend repeat CT scan of the chest in approximately 3 to 6 months time.  The patient and her son voiced understanding of the plan moving forward.  Given her advanced age I do not believe that she would be a candidate for treatment in the event we did find malignancy.  I discussed my concern with the patient and her son today.  She also emphasized her desire not to undergo any procedures.  We will keep this in mind in future discussions pending the results of our imaging.  # Bilateral Pulmonary Nodules --patient had CT C/A/P which showed numerous pulmonary nodules consistent with pulmonary metastatic disease.   There is no clear evidence of a primary malignancy. -- We will further characterize his bilateral pulmonary nodules with a PET CT scan.  --if no evidence of FDG avidity would recommend repeat CT scan in 3 months time to assure stability. If FDG avid will consider risks/benefits of biopsy.  --given her advanced age and deconditioning she may not be a candidate for systemic therapy if we do find a malignancy.  --RTC in approximately 3 months time with repeat scan or sooner if an abnormality is noted on PET CT scan.   No orders of the defined types were placed in this encounter.  All questions were answered. The patient knows to call the clinic  with any problems, questions or concerns.  A total of more than 60 minutes were spent on this encounter and over half of that time was spent on counseling and coordination of care as outlined above.   Ledell Peoples, MD Department of Hematology/Oncology [place] [facility] Phone: [phone] Pager: [phone] Email: [name]  02/04/2021 4:23 PM

## 2021-02-05 DIAGNOSIS — I10 Essential (primary) hypertension: Secondary | ICD-10-CM | POA: Diagnosis not present

## 2021-02-05 DIAGNOSIS — H401133 Primary open-angle glaucoma, bilateral, severe stage: Secondary | ICD-10-CM | POA: Diagnosis not present

## 2021-02-05 DIAGNOSIS — J45909 Unspecified asthma, uncomplicated: Secondary | ICD-10-CM | POA: Diagnosis not present

## 2021-02-05 DIAGNOSIS — M48061 Spinal stenosis, lumbar region without neurogenic claudication: Secondary | ICD-10-CM | POA: Diagnosis not present

## 2021-02-05 DIAGNOSIS — M5412 Radiculopathy, cervical region: Secondary | ICD-10-CM | POA: Diagnosis not present

## 2021-02-05 DIAGNOSIS — E1139 Type 2 diabetes mellitus with other diabetic ophthalmic complication: Secondary | ICD-10-CM | POA: Diagnosis not present

## 2021-02-05 DIAGNOSIS — M810 Age-related osteoporosis without current pathological fracture: Secondary | ICD-10-CM | POA: Diagnosis not present

## 2021-02-05 DIAGNOSIS — M47817 Spondylosis without myelopathy or radiculopathy, lumbosacral region: Secondary | ICD-10-CM | POA: Diagnosis not present

## 2021-02-05 DIAGNOSIS — S32048D Other fracture of fourth lumbar vertebra, subsequent encounter for fracture with routine healing: Secondary | ICD-10-CM | POA: Diagnosis not present

## 2021-02-07 ENCOUNTER — Telehealth: Payer: Self-pay | Admitting: Hematology and Oncology

## 2021-02-07 NOTE — Telephone Encounter (Signed)
Scheduled per los. Called and spoke with patient. Confirmed appt 

## 2021-02-08 DIAGNOSIS — I10 Essential (primary) hypertension: Secondary | ICD-10-CM | POA: Diagnosis not present

## 2021-02-08 DIAGNOSIS — S32048D Other fracture of fourth lumbar vertebra, subsequent encounter for fracture with routine healing: Secondary | ICD-10-CM | POA: Diagnosis not present

## 2021-02-08 DIAGNOSIS — M47817 Spondylosis without myelopathy or radiculopathy, lumbosacral region: Secondary | ICD-10-CM | POA: Diagnosis not present

## 2021-02-08 DIAGNOSIS — M5412 Radiculopathy, cervical region: Secondary | ICD-10-CM | POA: Diagnosis not present

## 2021-02-08 DIAGNOSIS — M810 Age-related osteoporosis without current pathological fracture: Secondary | ICD-10-CM | POA: Diagnosis not present

## 2021-02-08 DIAGNOSIS — J45909 Unspecified asthma, uncomplicated: Secondary | ICD-10-CM | POA: Diagnosis not present

## 2021-02-08 DIAGNOSIS — H401133 Primary open-angle glaucoma, bilateral, severe stage: Secondary | ICD-10-CM | POA: Diagnosis not present

## 2021-02-08 DIAGNOSIS — E1139 Type 2 diabetes mellitus with other diabetic ophthalmic complication: Secondary | ICD-10-CM | POA: Diagnosis not present

## 2021-02-08 DIAGNOSIS — M48061 Spinal stenosis, lumbar region without neurogenic claudication: Secondary | ICD-10-CM | POA: Diagnosis not present

## 2021-02-09 DIAGNOSIS — J45909 Unspecified asthma, uncomplicated: Secondary | ICD-10-CM | POA: Diagnosis not present

## 2021-02-09 DIAGNOSIS — S32048D Other fracture of fourth lumbar vertebra, subsequent encounter for fracture with routine healing: Secondary | ICD-10-CM | POA: Diagnosis not present

## 2021-02-09 DIAGNOSIS — M5412 Radiculopathy, cervical region: Secondary | ICD-10-CM | POA: Diagnosis not present

## 2021-02-09 DIAGNOSIS — I10 Essential (primary) hypertension: Secondary | ICD-10-CM | POA: Diagnosis not present

## 2021-02-09 DIAGNOSIS — M48061 Spinal stenosis, lumbar region without neurogenic claudication: Secondary | ICD-10-CM | POA: Diagnosis not present

## 2021-02-09 DIAGNOSIS — M47817 Spondylosis without myelopathy or radiculopathy, lumbosacral region: Secondary | ICD-10-CM | POA: Diagnosis not present

## 2021-02-09 DIAGNOSIS — E1139 Type 2 diabetes mellitus with other diabetic ophthalmic complication: Secondary | ICD-10-CM | POA: Diagnosis not present

## 2021-02-09 DIAGNOSIS — H401133 Primary open-angle glaucoma, bilateral, severe stage: Secondary | ICD-10-CM | POA: Diagnosis not present

## 2021-02-09 DIAGNOSIS — M810 Age-related osteoporosis without current pathological fracture: Secondary | ICD-10-CM | POA: Diagnosis not present

## 2021-02-15 DIAGNOSIS — H401133 Primary open-angle glaucoma, bilateral, severe stage: Secondary | ICD-10-CM | POA: Diagnosis not present

## 2021-02-15 DIAGNOSIS — M5412 Radiculopathy, cervical region: Secondary | ICD-10-CM | POA: Diagnosis not present

## 2021-02-15 DIAGNOSIS — J45909 Unspecified asthma, uncomplicated: Secondary | ICD-10-CM | POA: Diagnosis not present

## 2021-02-15 DIAGNOSIS — E1139 Type 2 diabetes mellitus with other diabetic ophthalmic complication: Secondary | ICD-10-CM | POA: Diagnosis not present

## 2021-02-15 DIAGNOSIS — M48061 Spinal stenosis, lumbar region without neurogenic claudication: Secondary | ICD-10-CM | POA: Diagnosis not present

## 2021-02-15 DIAGNOSIS — M47817 Spondylosis without myelopathy or radiculopathy, lumbosacral region: Secondary | ICD-10-CM | POA: Diagnosis not present

## 2021-02-15 DIAGNOSIS — M810 Age-related osteoporosis without current pathological fracture: Secondary | ICD-10-CM | POA: Diagnosis not present

## 2021-02-15 DIAGNOSIS — S32048D Other fracture of fourth lumbar vertebra, subsequent encounter for fracture with routine healing: Secondary | ICD-10-CM | POA: Diagnosis not present

## 2021-02-15 DIAGNOSIS — I10 Essential (primary) hypertension: Secondary | ICD-10-CM | POA: Diagnosis not present

## 2021-02-17 ENCOUNTER — Ambulatory Visit (HOSPITAL_COMMUNITY): Admission: RE | Admit: 2021-02-17 | Payer: Medicare HMO | Source: Ambulatory Visit

## 2021-02-23 DIAGNOSIS — J45909 Unspecified asthma, uncomplicated: Secondary | ICD-10-CM | POA: Diagnosis not present

## 2021-02-23 DIAGNOSIS — E1139 Type 2 diabetes mellitus with other diabetic ophthalmic complication: Secondary | ICD-10-CM | POA: Diagnosis not present

## 2021-02-23 DIAGNOSIS — M5412 Radiculopathy, cervical region: Secondary | ICD-10-CM | POA: Diagnosis not present

## 2021-02-23 DIAGNOSIS — M810 Age-related osteoporosis without current pathological fracture: Secondary | ICD-10-CM | POA: Diagnosis not present

## 2021-02-23 DIAGNOSIS — H401133 Primary open-angle glaucoma, bilateral, severe stage: Secondary | ICD-10-CM | POA: Diagnosis not present

## 2021-02-23 DIAGNOSIS — M47817 Spondylosis without myelopathy or radiculopathy, lumbosacral region: Secondary | ICD-10-CM | POA: Diagnosis not present

## 2021-02-23 DIAGNOSIS — I10 Essential (primary) hypertension: Secondary | ICD-10-CM | POA: Diagnosis not present

## 2021-02-23 DIAGNOSIS — M48061 Spinal stenosis, lumbar region without neurogenic claudication: Secondary | ICD-10-CM | POA: Diagnosis not present

## 2021-02-23 DIAGNOSIS — S32048D Other fracture of fourth lumbar vertebra, subsequent encounter for fracture with routine healing: Secondary | ICD-10-CM | POA: Diagnosis not present

## 2021-03-07 DIAGNOSIS — S32048D Other fracture of fourth lumbar vertebra, subsequent encounter for fracture with routine healing: Secondary | ICD-10-CM | POA: Diagnosis not present

## 2021-03-07 DIAGNOSIS — I10 Essential (primary) hypertension: Secondary | ICD-10-CM | POA: Diagnosis not present

## 2021-03-07 DIAGNOSIS — M5412 Radiculopathy, cervical region: Secondary | ICD-10-CM | POA: Diagnosis not present

## 2021-03-07 DIAGNOSIS — M48061 Spinal stenosis, lumbar region without neurogenic claudication: Secondary | ICD-10-CM | POA: Diagnosis not present

## 2021-03-07 DIAGNOSIS — E1139 Type 2 diabetes mellitus with other diabetic ophthalmic complication: Secondary | ICD-10-CM | POA: Diagnosis not present

## 2021-03-07 DIAGNOSIS — M810 Age-related osteoporosis without current pathological fracture: Secondary | ICD-10-CM | POA: Diagnosis not present

## 2021-03-07 DIAGNOSIS — M47817 Spondylosis without myelopathy or radiculopathy, lumbosacral region: Secondary | ICD-10-CM | POA: Diagnosis not present

## 2021-03-07 DIAGNOSIS — H401133 Primary open-angle glaucoma, bilateral, severe stage: Secondary | ICD-10-CM | POA: Diagnosis not present

## 2021-03-07 DIAGNOSIS — J45909 Unspecified asthma, uncomplicated: Secondary | ICD-10-CM | POA: Diagnosis not present

## 2021-03-24 DIAGNOSIS — H401133 Primary open-angle glaucoma, bilateral, severe stage: Secondary | ICD-10-CM | POA: Diagnosis not present

## 2021-04-06 DIAGNOSIS — Z1389 Encounter for screening for other disorder: Secondary | ICD-10-CM | POA: Diagnosis not present

## 2021-04-06 DIAGNOSIS — M810 Age-related osteoporosis without current pathological fracture: Secondary | ICD-10-CM | POA: Diagnosis not present

## 2021-04-06 DIAGNOSIS — H409 Unspecified glaucoma: Secondary | ICD-10-CM | POA: Diagnosis not present

## 2021-04-06 DIAGNOSIS — E1169 Type 2 diabetes mellitus with other specified complication: Secondary | ICD-10-CM | POA: Diagnosis not present

## 2021-04-06 DIAGNOSIS — Z0000 Encounter for general adult medical examination without abnormal findings: Secondary | ICD-10-CM | POA: Diagnosis not present

## 2021-04-06 DIAGNOSIS — S32000D Wedge compression fracture of unspecified lumbar vertebra, subsequent encounter for fracture with routine healing: Secondary | ICD-10-CM | POA: Diagnosis not present

## 2021-04-06 DIAGNOSIS — E039 Hypothyroidism, unspecified: Secondary | ICD-10-CM | POA: Diagnosis not present

## 2021-04-06 DIAGNOSIS — I10 Essential (primary) hypertension: Secondary | ICD-10-CM | POA: Diagnosis not present

## 2021-04-06 DIAGNOSIS — R54 Age-related physical debility: Secondary | ICD-10-CM | POA: Diagnosis not present

## 2021-04-15 DIAGNOSIS — E1169 Type 2 diabetes mellitus with other specified complication: Secondary | ICD-10-CM | POA: Diagnosis not present

## 2021-04-21 ENCOUNTER — Other Ambulatory Visit: Payer: Self-pay

## 2021-04-21 ENCOUNTER — Ambulatory Visit (HOSPITAL_COMMUNITY)
Admission: RE | Admit: 2021-04-21 | Discharge: 2021-04-21 | Disposition: A | Discharge status: Home / Self Care | Payer: Medicare HMO | Source: Ambulatory Visit | Attending: Hematology and Oncology | Admitting: Hematology and Oncology

## 2021-04-21 DIAGNOSIS — R918 Other nonspecific abnormal finding of lung field: Secondary | ICD-10-CM | POA: Insufficient documentation

## 2021-04-21 DIAGNOSIS — I700 Atherosclerosis of aorta: Secondary | ICD-10-CM | POA: Diagnosis not present

## 2021-04-21 DIAGNOSIS — R911 Solitary pulmonary nodule: Secondary | ICD-10-CM | POA: Diagnosis not present

## 2021-04-21 LAB — GLUCOSE, CAPILLARY: Glucose-Capillary: 82 mg/dL (ref 70–99)

## 2021-04-21 MED ORDER — FLUDEOXYGLUCOSE F - 18 (FDG) INJECTION
7.0000 | Freq: Once | INTRAVENOUS | Status: AC | PRN
  Administered 2021-04-21: 6 via INTRAVENOUS

## 2021-04-27 DIAGNOSIS — H401133 Primary open-angle glaucoma, bilateral, severe stage: Secondary | ICD-10-CM | POA: Diagnosis not present

## 2021-05-20 DIAGNOSIS — W19XXXA Unspecified fall, initial encounter: Secondary | ICD-10-CM | POA: Diagnosis not present

## 2021-05-20 DIAGNOSIS — S7002XA Contusion of left hip, initial encounter: Secondary | ICD-10-CM | POA: Diagnosis not present

## 2021-05-31 DIAGNOSIS — M25552 Pain in left hip: Secondary | ICD-10-CM | POA: Diagnosis not present

## 2021-06-29 ENCOUNTER — Encounter: Payer: Self-pay | Admitting: Podiatrist

## 2021-06-29 ENCOUNTER — Ambulatory Visit: Payer: Medicare HMO | Admitting: Podiatrist

## 2021-06-29 ENCOUNTER — Other Ambulatory Visit: Payer: Self-pay

## 2021-06-29 DIAGNOSIS — M79675 Pain in left toe(s): Secondary | ICD-10-CM

## 2021-06-29 DIAGNOSIS — M79674 Pain in right toe(s): Secondary | ICD-10-CM

## 2021-06-29 DIAGNOSIS — B351 Tinea unguium: Secondary | ICD-10-CM

## 2021-06-29 NOTE — Progress Notes (Signed)
Chief Complaint  Patient presents with   Nail Problem    RFC Nail trim bilateral nails 1-5.      HPI: Patient is 87 y.o. female who presents today for painful thickened toenails.  She relates the toenails have become long and they are painful in shoes and when walking.  She is unable to trim them herself due to the thickness of the nails.     Allergies  Allergen Reactions   Codeine Nausea And Vomiting and Other (See Comments)    uknown   Biaxin [Clarithromycin] Other (See Comments)    Sweats   Caffeine     Shaky    Review of systems is reviewed and negative.   Physical Exam  Constitutional Pt is a pleasant 87 y.o. Caucasian female, WD, WN in NAD.Marland Kitchen  Neurovascular status unchanged b/l lower extremities. Faintly palpable pedal pulses b/l. Pedal hair absent. Lower extremity skin temperature gradient within normal limits. No pain with calf compression b/l. No cyanosis or clubbing noted.  Neurologic Normal speech. Oriented to person, place, and time. Protective sensation intact 5/5 intact bilaterally with 10g monofilament b/l. Vibratory sensation intact b/l.  Dermatologic Pedal skin with normal turgor, texture and tone bilaterally. No open wounds bilaterally. No interdigital macerations bilaterally. Toenails 1-5 b/l elongated, discolored, dystrophic, thickened, crumbly with subungual debris and tenderness to dorsal palpation.  Orthopedic: Normal muscle strength 5/5 to all lower extremity muscle groups bilaterally. No pain crepitus or joint limitation noted with ROM b/l. No gross bony deformities bilaterally. Utilizes wheelchair for mobility assistance.    Assessment: 1. Pain due to onychomycosis of toenails of both feet      Plan: Debridement of toenails was recommended.  Onychoreduction of symptomatic toenails was performed via nail nipper and power burr without iatrogenic incident.  Patient was instructed on signs and symptoms of infection and was told to call immediately  should any of these arise.  Recommended follow up in 3 months or instructed to call sooner if any pedal concerns arise.

## 2021-06-29 NOTE — Patient Instructions (Signed)

## 2021-07-16 DIAGNOSIS — Z23 Encounter for immunization: Secondary | ICD-10-CM | POA: Diagnosis not present

## 2021-08-08 ENCOUNTER — Inpatient Hospital Stay: Payer: Medicare HMO | Attending: Hematology and Oncology | Admitting: Hematology and Oncology

## 2021-08-08 ENCOUNTER — Inpatient Hospital Stay: Payer: Medicare HMO

## 2021-08-08 ENCOUNTER — Other Ambulatory Visit: Payer: Self-pay | Admitting: Hematology and Oncology

## 2021-08-08 DIAGNOSIS — R918 Other nonspecific abnormal finding of lung field: Secondary | ICD-10-CM

## 2021-09-17 DIAGNOSIS — S2020XA Contusion of thorax, unspecified, initial encounter: Secondary | ICD-10-CM | POA: Diagnosis not present

## 2021-09-17 DIAGNOSIS — M542 Cervicalgia: Secondary | ICD-10-CM | POA: Diagnosis not present

## 2021-09-17 DIAGNOSIS — M546 Pain in thoracic spine: Secondary | ICD-10-CM | POA: Diagnosis not present

## 2021-09-21 DIAGNOSIS — R54 Age-related physical debility: Secondary | ICD-10-CM | POA: Diagnosis not present

## 2021-09-21 DIAGNOSIS — Z9181 History of falling: Secondary | ICD-10-CM | POA: Diagnosis not present

## 2021-09-21 DIAGNOSIS — R350 Frequency of micturition: Secondary | ICD-10-CM | POA: Diagnosis not present

## 2021-09-23 DIAGNOSIS — H409 Unspecified glaucoma: Secondary | ICD-10-CM | POA: Diagnosis not present

## 2021-09-23 DIAGNOSIS — H9190 Unspecified hearing loss, unspecified ear: Secondary | ICD-10-CM | POA: Diagnosis not present

## 2021-09-23 DIAGNOSIS — E119 Type 2 diabetes mellitus without complications: Secondary | ICD-10-CM | POA: Diagnosis not present

## 2021-09-23 DIAGNOSIS — K5900 Constipation, unspecified: Secondary | ICD-10-CM | POA: Diagnosis not present

## 2021-09-23 DIAGNOSIS — E039 Hypothyroidism, unspecified: Secondary | ICD-10-CM | POA: Diagnosis not present

## 2021-09-23 DIAGNOSIS — R32 Unspecified urinary incontinence: Secondary | ICD-10-CM | POA: Diagnosis not present

## 2021-09-23 DIAGNOSIS — M810 Age-related osteoporosis without current pathological fracture: Secondary | ICD-10-CM | POA: Diagnosis not present

## 2021-09-23 DIAGNOSIS — I10 Essential (primary) hypertension: Secondary | ICD-10-CM | POA: Diagnosis not present

## 2021-09-23 DIAGNOSIS — J45909 Unspecified asthma, uncomplicated: Secondary | ICD-10-CM | POA: Diagnosis not present

## 2021-09-25 ENCOUNTER — Emergency Department (HOSPITAL_COMMUNITY): Payer: Medicare HMO

## 2021-09-25 ENCOUNTER — Encounter (HOSPITAL_COMMUNITY): Payer: Self-pay | Admitting: Emergency Medicine

## 2021-09-25 ENCOUNTER — Other Ambulatory Visit: Payer: Self-pay

## 2021-09-25 ENCOUNTER — Inpatient Hospital Stay (HOSPITAL_COMMUNITY): Payer: Medicare HMO

## 2021-09-25 ENCOUNTER — Inpatient Hospital Stay (HOSPITAL_COMMUNITY)
Admission: EM | Admit: 2021-09-25 | Discharge: 2021-09-30 | DRG: 522 | Disposition: A | Discharge status: Skilled Nursing Facility | Payer: Medicare HMO | Attending: Student | Admitting: Student

## 2021-09-25 DIAGNOSIS — S72001A Fracture of unspecified part of neck of right femur, initial encounter for closed fracture: Secondary | ICD-10-CM

## 2021-09-25 DIAGNOSIS — I10 Essential (primary) hypertension: Secondary | ICD-10-CM | POA: Diagnosis present

## 2021-09-25 DIAGNOSIS — Z7189 Other specified counseling: Secondary | ICD-10-CM | POA: Diagnosis not present

## 2021-09-25 DIAGNOSIS — R410 Disorientation, unspecified: Secondary | ICD-10-CM | POA: Diagnosis not present

## 2021-09-25 DIAGNOSIS — E538 Deficiency of other specified B group vitamins: Secondary | ICD-10-CM | POA: Diagnosis not present

## 2021-09-25 DIAGNOSIS — G5601 Carpal tunnel syndrome, right upper limb: Secondary | ICD-10-CM | POA: Diagnosis present

## 2021-09-25 DIAGNOSIS — S72051D Unspecified fracture of head of right femur, subsequent encounter for closed fracture with routine healing: Secondary | ICD-10-CM | POA: Diagnosis not present

## 2021-09-25 DIAGNOSIS — Z833 Family history of diabetes mellitus: Secondary | ICD-10-CM | POA: Diagnosis not present

## 2021-09-25 DIAGNOSIS — H401133 Primary open-angle glaucoma, bilateral, severe stage: Secondary | ICD-10-CM | POA: Diagnosis not present

## 2021-09-25 DIAGNOSIS — S72011A Unspecified intracapsular fracture of right femur, initial encounter for closed fracture: Principal | ICD-10-CM | POA: Diagnosis present

## 2021-09-25 DIAGNOSIS — F05 Delirium due to known physiological condition: Secondary | ICD-10-CM | POA: Diagnosis not present

## 2021-09-25 DIAGNOSIS — E871 Hypo-osmolality and hyponatremia: Secondary | ICD-10-CM | POA: Diagnosis not present

## 2021-09-25 DIAGNOSIS — Z79899 Other long term (current) drug therapy: Secondary | ICD-10-CM

## 2021-09-25 DIAGNOSIS — M8580 Other specified disorders of bone density and structure, unspecified site: Secondary | ICD-10-CM | POA: Diagnosis not present

## 2021-09-25 DIAGNOSIS — M542 Cervicalgia: Secondary | ICD-10-CM | POA: Diagnosis not present

## 2021-09-25 DIAGNOSIS — R6889 Other general symptoms and signs: Secondary | ICD-10-CM | POA: Diagnosis not present

## 2021-09-25 DIAGNOSIS — E860 Dehydration: Secondary | ICD-10-CM | POA: Diagnosis not present

## 2021-09-25 DIAGNOSIS — R4189 Other symptoms and signs involving cognitive functions and awareness: Secondary | ICD-10-CM | POA: Diagnosis not present

## 2021-09-25 DIAGNOSIS — R54 Age-related physical debility: Secondary | ICD-10-CM | POA: Diagnosis present

## 2021-09-25 DIAGNOSIS — I4510 Unspecified right bundle-branch block: Secondary | ICD-10-CM | POA: Diagnosis present

## 2021-09-25 DIAGNOSIS — Z96641 Presence of right artificial hip joint: Secondary | ICD-10-CM | POA: Diagnosis not present

## 2021-09-25 DIAGNOSIS — W1830XA Fall on same level, unspecified, initial encounter: Secondary | ICD-10-CM | POA: Diagnosis present

## 2021-09-25 DIAGNOSIS — I119 Hypertensive heart disease without heart failure: Secondary | ICD-10-CM | POA: Diagnosis not present

## 2021-09-25 DIAGNOSIS — M1711 Unilateral primary osteoarthritis, right knee: Secondary | ICD-10-CM | POA: Diagnosis not present

## 2021-09-25 DIAGNOSIS — Z419 Encounter for procedure for purposes other than remedying health state, unspecified: Secondary | ICD-10-CM

## 2021-09-25 DIAGNOSIS — Z79890 Hormone replacement therapy (postmenopausal): Secondary | ICD-10-CM | POA: Diagnosis not present

## 2021-09-25 DIAGNOSIS — M150 Primary generalized (osteo)arthritis: Secondary | ICD-10-CM | POA: Diagnosis not present

## 2021-09-25 DIAGNOSIS — Z20822 Contact with and (suspected) exposure to covid-19: Secondary | ICD-10-CM | POA: Diagnosis not present

## 2021-09-25 DIAGNOSIS — W19XXXA Unspecified fall, initial encounter: Secondary | ICD-10-CM

## 2021-09-25 DIAGNOSIS — Z7401 Bed confinement status: Secondary | ICD-10-CM | POA: Diagnosis not present

## 2021-09-25 DIAGNOSIS — D62 Acute posthemorrhagic anemia: Secondary | ICD-10-CM | POA: Diagnosis not present

## 2021-09-25 DIAGNOSIS — Z471 Aftercare following joint replacement surgery: Secondary | ICD-10-CM | POA: Diagnosis not present

## 2021-09-25 DIAGNOSIS — M25551 Pain in right hip: Secondary | ICD-10-CM | POA: Diagnosis not present

## 2021-09-25 DIAGNOSIS — S72041A Displaced fracture of base of neck of right femur, initial encounter for closed fracture: Secondary | ICD-10-CM | POA: Diagnosis not present

## 2021-09-25 DIAGNOSIS — R519 Headache, unspecified: Secondary | ICD-10-CM | POA: Diagnosis not present

## 2021-09-25 DIAGNOSIS — E039 Hypothyroidism, unspecified: Secondary | ICD-10-CM | POA: Diagnosis present

## 2021-09-25 DIAGNOSIS — Y92009 Unspecified place in unspecified non-institutional (private) residence as the place of occurrence of the external cause: Secondary | ICD-10-CM | POA: Diagnosis not present

## 2021-09-25 DIAGNOSIS — R4182 Altered mental status, unspecified: Secondary | ICD-10-CM | POA: Diagnosis not present

## 2021-09-25 DIAGNOSIS — S72009A Fracture of unspecified part of neck of unspecified femur, initial encounter for closed fracture: Secondary | ICD-10-CM | POA: Diagnosis present

## 2021-09-25 DIAGNOSIS — L8990 Pressure ulcer of unspecified site, unspecified stage: Secondary | ICD-10-CM | POA: Insufficient documentation

## 2021-09-25 DIAGNOSIS — W010XXA Fall on same level from slipping, tripping and stumbling without subsequent striking against object, initial encounter: Secondary | ICD-10-CM | POA: Diagnosis not present

## 2021-09-25 DIAGNOSIS — M48061 Spinal stenosis, lumbar region without neurogenic claudication: Secondary | ICD-10-CM | POA: Diagnosis not present

## 2021-09-25 DIAGNOSIS — H409 Unspecified glaucoma: Secondary | ICD-10-CM | POA: Diagnosis not present

## 2021-09-25 LAB — COMPREHENSIVE METABOLIC PANEL
ALT: 21 U/L (ref 0–44)
AST: 22 U/L (ref 15–41)
Albumin: 3.7 g/dL (ref 3.5–5.0)
Alkaline Phosphatase: 67 U/L (ref 38–126)
Anion gap: 10 (ref 5–15)
BUN: 11 mg/dL (ref 8–23)
CO2: 21 mmol/L — ABNORMAL LOW (ref 22–32)
Calcium: 9 mg/dL (ref 8.9–10.3)
Chloride: 102 mmol/L (ref 98–111)
Creatinine, Ser: 0.52 mg/dL (ref 0.44–1.00)
GFR, Estimated: >60 mL/min (ref >60)
Glucose, Bld: 133 mg/dL — ABNORMAL HIGH (ref 70–99)
Potassium: 3.8 mmol/L (ref 3.5–5.1)
Sodium: 133 mmol/L — ABNORMAL LOW (ref 135–145)
Total Bilirubin: 0.6 mg/dL (ref 0.3–1.2)
Total Protein: 7.3 g/dL (ref 6.5–8.1)

## 2021-09-25 LAB — RESP PANEL BY RT-PCR (FLU A&B, COVID) ARPGX2
Influenza A by PCR: NEGATIVE
Influenza B by PCR: NEGATIVE
SARS Coronavirus 2 by RT PCR: NEGATIVE

## 2021-09-25 LAB — CBC WITH DIFFERENTIAL/PLATELET
Abs Immature Granulocytes: 0.08 10*3/uL — ABNORMAL HIGH (ref 0.00–0.07)
Basophils Absolute: 0 10*3/uL (ref 0.0–0.1)
Basophils Relative: 0 %
Eosinophils Absolute: 0 10*3/uL (ref 0.0–0.5)
Eosinophils Relative: 0 %
HCT: 42.1 % (ref 36.0–46.0)
Hemoglobin: 13.6 g/dL (ref 12.0–15.0)
Immature Granulocytes: 1 %
Lymphocytes Relative: 8 %
Lymphs Abs: 0.9 10*3/uL (ref 0.7–4.0)
MCH: 29.1 pg (ref 26.0–34.0)
MCHC: 32.3 g/dL (ref 30.0–36.0)
MCV: 90 fL (ref 80.0–100.0)
Monocytes Absolute: 0.7 10*3/uL (ref 0.1–1.0)
Monocytes Relative: 7 %
Neutro Abs: 8.6 10*3/uL — ABNORMAL HIGH (ref 1.7–7.7)
Neutrophils Relative %: 84 %
Platelets: 207 10*3/uL (ref 150–400)
RBC: 4.68 MIL/uL (ref 3.87–5.11)
RDW: 13.1 % (ref 11.5–15.5)
WBC: 10.3 10*3/uL (ref 4.0–10.5)
nRBC: 0 % (ref 0.0–0.2)

## 2021-09-25 LAB — URINALYSIS, MICROSCOPIC (REFLEX)

## 2021-09-25 LAB — URINALYSIS, ROUTINE W REFLEX MICROSCOPIC
Bilirubin Urine: NEGATIVE
Glucose, UA: NEGATIVE mg/dL
Ketones, ur: NEGATIVE mg/dL
Nitrite: NEGATIVE
Protein, ur: NEGATIVE mg/dL
Specific Gravity, Urine: 1.025 (ref 1.005–1.030)
pH: 7 (ref 5.0–8.0)

## 2021-09-25 MED ORDER — HYDRALAZINE HCL 20 MG/ML IJ SOLN: 10.0000 mg | INTRAMUSCULAR | Status: DC | PRN

## 2021-09-25 MED ORDER — ALPRAZOLAM 0.25 MG PO TABS
0.2500 mg | ORAL_TABLET | Freq: Three times a day (TID) | ORAL | Status: DC | PRN
  Administered 2021-09-25 – 2021-09-27 (×3): 0.25 mg via ORAL
  Filled 2021-09-25 (×3): qty 1

## 2021-09-25 MED ORDER — HEPARIN SODIUM (PORCINE) 5000 UNIT/ML IJ SOLN
5000.0000 [IU] | Freq: Three times a day (TID) | INTRAMUSCULAR | Status: DC
  Filled 2021-09-25: qty 1

## 2021-09-25 MED ORDER — ONDANSETRON HCL 4 MG PO TABS: 4.0000 mg | ORAL_TABLET | Freq: Four times a day (QID) | ORAL | Status: DC | PRN

## 2021-09-25 MED ORDER — ONDANSETRON HCL 4 MG/2ML IJ SOLN: 4.0000 mg | Freq: Four times a day (QID) | INTRAMUSCULAR | Status: DC | PRN

## 2021-09-25 MED ORDER — ACETAMINOPHEN 650 MG RE SUPP: 650.0000 mg | Freq: Four times a day (QID) | RECTAL | Status: DC | PRN

## 2021-09-25 MED ORDER — SODIUM CHLORIDE 0.9% FLUSH
3.0000 mL | Freq: Two times a day (BID) | INTRAVENOUS | Status: DC
  Administered 2021-09-25: 14:00:00 3 mL via INTRAVENOUS

## 2021-09-25 MED ORDER — DORZOLAMIDE HCL 2 % OP SOLN
1.0000 [drp] | Freq: Two times a day (BID) | OPHTHALMIC | Status: DC
  Administered 2021-09-25 – 2021-09-30 (×6): 1 [drp] via OPHTHALMIC
  Filled 2021-09-25 (×2): qty 10

## 2021-09-25 MED ORDER — LISINOPRIL 20 MG PO TABS
20.0000 mg | ORAL_TABLET | Freq: Every day | ORAL | Status: DC
  Administered 2021-09-25 – 2021-09-30 (×5): 20 mg via ORAL
  Filled 2021-09-25 (×5): qty 1

## 2021-09-25 MED ORDER — LATANOPROST 0.005 % OP SOLN
1.0000 [drp] | Freq: Every day | OPHTHALMIC | Status: DC
  Administered 2021-09-25 – 2021-09-29 (×3): 1 [drp] via OPHTHALMIC
  Filled 2021-09-25 (×2): qty 2.5

## 2021-09-25 MED ORDER — FENTANYL CITRATE PF 50 MCG/ML IJ SOSY
25.0000 ug | PREFILLED_SYRINGE | INTRAMUSCULAR | Status: DC | PRN
Start: 2021-09-25 — End: 2021-09-26
  Administered 2021-09-25 – 2021-09-26 (×5): 25 ug via INTRAVENOUS
  Filled 2021-09-25 (×5): qty 1

## 2021-09-25 MED ORDER — ACETAMINOPHEN 325 MG PO TABS
650.0000 mg | ORAL_TABLET | Freq: Four times a day (QID) | ORAL | Status: DC | PRN
  Administered 2021-09-25: 650 mg via ORAL
  Filled 2021-09-25 (×2): qty 2

## 2021-09-25 MED ORDER — LEVOTHYROXINE SODIUM 100 MCG PO TABS
100.0000 ug | ORAL_TABLET | Freq: Every day | ORAL | Status: DC
Start: 2021-09-26 — End: 2021-09-30
  Administered 2021-09-26 – 2021-09-30 (×5): 100 ug via ORAL
  Filled 2021-09-25 (×5): qty 1

## 2021-09-25 NOTE — H&P (Signed)
History and Physical   Linda Copeland SVX:[id number] DOB: 06/19/1931 DOA: 09/25/2021  Referring MD/NP/PA: Dr. Ralene Bathe PCP: Gaynelle Arabian, MD  Patient coming from: Home  Chief Complaint: Fall, right hip pain  HPI: Linda Copeland is an 87 y.o. female with a history of recent memory decline, suspected dementia, HTN, osteopenia with vertebral compression fractures, severe glaucoma, hypothyroidism who presented to the ED by EMS after an unwitnessed fall at home 12/24 with resultant right hip pain.   ED Course: Patient was confused with shortened, externally rotated right leg confirmed to be due to displaced, impacted right femoral neck fracture. Imaging for other acute injuries due to the fall, including CT head and cervical spine were negative. ECG shows old RBBB with stable TWI in V1, V2. Unchanged from prior. No reported history of chest pain, dyspnea, leg swelling, orthopnea, or palpitations. No problems reported with prior surgeries. Orthopedics was consulted for management, patient admitted to medicine.   Review of Systems: Feeling well prior to fall, though had had another fall a few days prior. Not on blood thinners. No urinary complaints, and per HPI. All others reviewed and are negative.   Past Medical History:  Diagnosis Date   Arthritis    bilateral severe facet L4-L5, L5-S1   Carpal tunnel syndrome of right wrist    Chronic cervical radiculopathy    C5   Compression fracture of lumbar vertebra (HCC)    L2-L3   Environmental and seasonal allergies    Frequent urination    Hematoma of frontal scalp 10/06/2016   History of bronchitis    History of humerus fracture 10/06/2016   Right proximal   History of subdural hematoma 10/06/2016   Left falcotentorial    HOH (hard of hearing)    Hypertension    Hypothyroidism    Nasal fracture 10/06/2016   OA (osteoarthritis)    AC glenohumeral joinr   Osteopenia    Spinal stenosis at L4-L5 level    Past Surgical History:  Procedure  Laterality Date   CARPAL TUNNEL RELEASE Right 01/22/2018   Procedure: RIGHT CARPAL TUNNEL RELEASE;  Surgeon: Daryll Brod, MD;  Location: [facility];  Service: Orthopedics;  Laterality: Right;   COLONOSCOPY     - Lives in [place] with her son.   reports that she has never smoked. She has never used smokeless tobacco. She reports that she does not currently use alcohol. She reports that she does not use drugs. Allergies  Allergen Reactions   Codeine Nausea And Vomiting and Other (See Comments)    uknown   Biaxin [Clarithromycin] Other (See Comments)    Sweats   Caffeine     Shaky   Family History  Problem Relation Age of Onset   Diabetes Son    - Family history otherwise reviewed and not pertinent.  Prior to Admission medications   Medication Sig Start Date End Date Taking? Authorizing Provider  dorzolamide (TRUSOPT) 2 % ophthalmic solution Place 1 drop into the right eye 2 (two) times daily. 08/06/21  Yes [provider]  ibandronate (BONIVA) 150 MG tablet Take 150 mg by mouth every 30 (thirty) days. 01/26/19  Yes [provider]  levothyroxine (SYNTHROID, LEVOTHROID) 100 MCG tablet Take 100 mcg by mouth daily before breakfast. 09/09/16  Yes [provider]  lisinopril (PRINIVIL,ZESTRIL) 20 MG tablet Take 20 mg by mouth daily. 09/09/16  Yes [provider]  LUMIGAN 0.01 % SOLN Place 1 drop into both eyes at bedtime.  03/18/18  Yes  [provider]  meloxicam (MOBIC) 7.5 MG tablet Take 7.5 mg by mouth daily. 09/17/21  Yes [provider]    Physical Exam: Vitals:   09/25/21 0500 09/25/21 0515 09/25/21 0808 09/25/21 1141  BP: (!) 129/93  (!) 162/82 (!) 171/92  Pulse:   94 97  Resp: 15  16 17   Temp:   (!) 97.4 F (36.3 C)   TempSrc:   Oral   SpO2:  94% 96% 95%  Weight:      Height:       Constitutional: Elderly female in no distress,  Eyes: Lids and conjunctivae normal, PERRL ENMT: Mucous membranes are  moist. Posterior pharynx clear of any exudate or lesions. Poor dentition.  Neck: normal, supple, no masses, no thyromegaly Respiratory: Non-labored breathing room air without accessory muscle use. Clear breath sounds to auscultation bilaterally Cardiovascular: Regular rate and rhythm, no murmurs, rubs, or gallops. No definite carotid bruits. No JVD. No significant LE edema. Palpable pedal pulses. Abdomen: Normoactive bowel sounds. No tenderness, non-distended, and no masses palpated. No hepatosplenomegaly. GU: No indwelling catheter Musculoskeletal: No clubbing / cyanosis. Holding legs flexed at hip, though right is foreshortened. No contractures. Normal muscle tone.  Skin: Warm, dry. No rashes, wounds, or  ulcers. No significant lesions noted.  Neurologic: CN II-XII grossly intact. Speech without dysarthria, no focal weakness or numbness though pt's cooperation is limited due to disorientation.   Psychiatric: Alert, not oriented.   Labs on Admission: I have personally reviewed following labs and imaging studies  CBC: Recent Labs  Lab 09/25/21 0225  WBC 10.3  NEUTROABS 8.6*  HGB 13.6  HCT 42.1  MCV 90.0  PLT 353   Basic Metabolic Panel: Recent Labs  Lab 09/25/21 0225  NA 133*  K 3.8  CL 102  CO2 21*  GLUCOSE 133*  BUN 11  CREATININE 0.52  CALCIUM 9.0   GFR: Estimated Creatinine Clearance: 42 mL/min (by C-G formula based on SCr of 0.52 mg/dL). Liver Function Tests: Recent Labs  Lab 09/25/21 0225  AST 22  ALT 21  ALKPHOS 67  BILITOT 0.6  PROT 7.3  ALBUMIN 3.7   No results for input(s): LIPASE, AMYLASE in the last 168 hours. No results for input(s): AMMONIA in the last 168 hours. Coagulation Profile: No results for input(s): INR, PROTIME in the last 168 hours. Cardiac Enzymes: No results for input(s): CKTOTAL, CKMB, CKMBINDEX, TROPONINI in the last 168 hours. BNP (last 3 results) No results for input(s): PROBNP in the last 8760 hours. HbA1C: No results for  input(s): HGBA1C in the last 72 hours. CBG: No results for input(s): GLUCAP in the last 168 hours. Lipid Profile: No results for input(s): CHOL, HDL, LDLCALC, TRIG, CHOLHDL, LDLDIRECT in the last 72 hours. Thyroid Function Tests: No results for input(s): TSH, T4TOTAL, FREET4, T3FREE, THYROIDAB in the last 72 hours. Anemia Panel: No results for input(s): VITAMINB12, FOLATE, FERRITIN, TIBC, IRON, RETICCTPCT in the last 72 hours. Urine analysis:    Component Value Date/Time   COLORURINE YELLOW 09/25/2021 0330   APPEARANCEUR CLOUDY (A) 09/25/2021 0330   LABSPEC 1.025 09/25/2021 0330   PHURINE 7.0 09/25/2021 0330   GLUCOSEU NEGATIVE 09/25/2021 0330   HGBUR TRACE (A) 09/25/2021 0330   BILIRUBINUR NEGATIVE 09/25/2021 0330   KETONESUR NEGATIVE 09/25/2021 0330   PROTEINUR NEGATIVE 09/25/2021 0330   NITRITE NEGATIVE 09/25/2021 0330   LEUKOCYTESUR TRACE (A) 09/25/2021 0330    Recent Results (from the past 240 hour(s))  Resp Panel by RT-PCR (Flu A&B, Covid)  Nasopharyngeal Swab     Status: None   Collection Time: 09/25/21  2:07 AM   Specimen: Nasopharyngeal Swab; Nasopharyngeal(NP) swabs in vial transport medium  Result Value Ref Range Status   SARS Coronavirus 2 by RT PCR NEGATIVE NEGATIVE Final    Comment: (NOTE) SARS-CoV-2 target nucleic acids are NOT DETECTED.  The SARS-CoV-2 RNA is generally detectable in upper respiratory specimens during the acute phase of infection. The lowest concentration of SARS-CoV-2 viral copies this assay can detect is 138 copies/mL. A negative result does not preclude SARS-Cov-2 infection and should not be used as the sole basis for treatment or other patient management decisions. A negative result may occur with  improper specimen collection/handling, submission of specimen other than nasopharyngeal swab, presence of viral mutation(s) within the areas targeted by this assay, and inadequate number of viral copies(<138 copies/mL). A negative result must be  combined with clinical observations, patient history, and epidemiological information. The expected result is Negative.  Fact Sheet for Patients:  EntrepreneurPulse.com.au  Fact Sheet for Healthcare Providers:  IncredibleEmployment.be  This test is no t yet approved or cleared by the [place] FDA and  has been authorized for detection and/or diagnosis of SARS-CoV-2 by FDA under an Emergency Use Authorization (EUA). This EUA will remain  in effect (meaning this test can be used) for the duration of the COVID-19 declaration under Section 564(b)(1) of the Act, 21 U.S.C.section 360bbb-3(b)(1), unless the authorization is terminated  or revoked sooner.       Influenza A by PCR NEGATIVE NEGATIVE Final   Influenza B by PCR NEGATIVE NEGATIVE Final    Comment: (NOTE) The Xpert Xpress SARS-CoV-2/FLU/RSV plus assay is intended as an aid in the diagnosis of influenza from Nasopharyngeal swab specimens and should not be used as a sole basis for treatment. Nasal washings and aspirates are unacceptable for Xpert Xpress SARS-CoV-2/FLU/RSV testing.  Fact Sheet for Patients: EntrepreneurPulse.com.au  Fact Sheet for Healthcare Providers: IncredibleEmployment.be  This test is not yet approved or cleared by the [place] FDA and has been authorized for detection and/or diagnosis of SARS-CoV-2 by FDA under an Emergency Use Authorization (EUA). This EUA will remain in effect (meaning this test can be used) for the duration of the COVID-19 declaration under Section 564(b)(1) of the Act, 21 U.S.C. section 360bbb-3(b)(1), unless the authorization is terminated or revoked.  Performed at [facility], [place] [address] [place], [place] [address]      Radiological Exams on Admission: DG Chest 1 View  Result Date: 09/25/2021 CLINICAL DATA:  Fall and confusion EXAM: CHEST  1 VIEW COMPARISON:  07/09/2019 FINDINGS:  The heart size and mediastinal contours are within normal limits. Both lungs are clear. The visualized skeletal structures are unremarkable. IMPRESSION: No active disease. Electronically Signed   By: Ulyses Jarred M.D.   On: 09/25/2021 03:03   CT Head Wo Contrast  Result Date: 09/25/2021 CLINICAL DATA:  Recent fall several hours ago with headaches and neck pain, initial encounter EXAM: CT HEAD WITHOUT CONTRAST CT CERVICAL SPINE WITHOUT CONTRAST TECHNIQUE: Multidetector CT imaging of the head and cervical spine was performed following the standard protocol without intravenous contrast. Multiplanar CT image reconstructions of the cervical spine were also generated. COMPARISON:  None. FINDINGS: CT HEAD FINDINGS Brain: No evidence of acute infarction, hemorrhage, hydrocephalus, extra-axial collection or mass lesion/mass effect. Chronic atrophic and ischemic changes are noted. Vascular: No hyperdense vessel or unexpected calcification. Skull: Normal. Negative for fracture or focal lesion. Sinuses/Orbits: No acute finding.  Other: None. CT CERVICAL SPINE FINDINGS Alignment: Mild degenerative anterolisthesis of C7 on T1 is noted. Skull base and vertebrae: 7 cervical segments are well visualized. Vertebral body height is well maintained. Irregularity along the superior aspect of the C6 vertebral body is noted with associated osteophytic changes consistent with degenerative change. No compression deformity is noted. Facet hypertrophic changes are noted. No acute fracture or acute facet abnormality is noted. Soft tissues and spinal canal: Surrounding soft tissue structures are within normal limits. Upper chest: Visualized lung apices demonstrate a small 3-4 mm nodule in the right upper lobe best seen on image number 72 of series 6. Other: None IMPRESSION: CT of the head: Chronic atrophic and ischemic changes. CT of cervical spine: Multilevel degenerative change. 3-4 mm nodule in the right upper lobe. No follow-up needed  if patient is low-risk. Non-contrast chest CT can be considered in 12 months if patient is high-risk. This recommendation follows the consensus statement: Guidelines for Management of Incidental Pulmonary Nodules Detected on CT Images: From the Fleischner Society 2017; Radiology 2017; 284:228-243. Electronically Signed   By: Inez Catalina M.D.   On: 09/25/2021 03:19   CT Cervical Spine Wo Contrast  Result Date: 09/25/2021 CLINICAL DATA:  Recent fall several hours ago with headaches and neck pain, initial encounter EXAM: CT HEAD WITHOUT CONTRAST CT CERVICAL SPINE WITHOUT CONTRAST TECHNIQUE: Multidetector CT imaging of the head and cervical spine was performed following the standard protocol without intravenous contrast. Multiplanar CT image reconstructions of the cervical spine were also generated. COMPARISON:  None. FINDINGS: CT HEAD FINDINGS Brain: No evidence of acute infarction, hemorrhage, hydrocephalus, extra-axial collection or mass lesion/mass effect. Chronic atrophic and ischemic changes are noted. Vascular: No hyperdense vessel or unexpected calcification. Skull: Normal. Negative for fracture or focal lesion. Sinuses/Orbits: No acute finding. Other: None. CT CERVICAL SPINE FINDINGS Alignment: Mild degenerative anterolisthesis of C7 on T1 is noted. Skull base and vertebrae: 7 cervical segments are well visualized. Vertebral body height is well maintained. Irregularity along the superior aspect of the C6 vertebral body is noted with associated osteophytic changes consistent with degenerative change. No compression deformity is noted. Facet hypertrophic changes are noted. No acute fracture or acute facet abnormality is noted. Soft tissues and spinal canal: Surrounding soft tissue structures are within normal limits. Upper chest: Visualized lung apices demonstrate a small 3-4 mm nodule in the right upper lobe best seen on image number 72 of series 6. Other: None IMPRESSION: CT of the head: Chronic atrophic  and ischemic changes. CT of cervical spine: Multilevel degenerative change. 3-4 mm nodule in the right upper lobe. No follow-up needed if patient is low-risk. Non-contrast chest CT can be considered in 12 months if patient is high-risk. This recommendation follows the consensus statement: Guidelines for Management of Incidental Pulmonary Nodules Detected on CT Images: From the Fleischner Society 2017; Radiology 2017; 284:228-243. Electronically Signed   By: Inez Catalina M.D.   On: 09/25/2021 03:19   DG Hip Unilat W or Wo Pelvis 2-3 Views Right  Result Date: 09/25/2021 CLINICAL DATA:  Fall and right hip pain. EXAM: DG HIP (WITH OR WITHOUT PELVIS) 2-3V RIGHT COMPARISON:  Right hip radiograph dated 01/11/2021. FINDINGS: There is a fracture of the right femoral neck with mild proximal migration and impaction of the femoral shaft. No dislocation. The bones are osteopenic. The soft tissues are unremarkable. Vascular calcifications noted. IMPRESSION: Mildly displaced and impacted right femoral neck fracture. Electronically Signed   By: Anner Crete M.D.   On:  09/25/2021 02:55    EKG: Independently reviewed. NSR w/RBBB stable with similar TWI to priors.   Assessment/Plan Principal Problem:   Hip fracture (HCC)   Closed right hip fracture:  - Management per orthopedics. Pt is at acceptable risk for cardiovascular perioperative complications with no indication for additional testing at this time.  - VTE ppx per orthopedics - Pain medications postoperatively per orthopedics - Postoperative weight bearing recommendations per ortho and will pursue PT/OT evaluations at that time.   Acute hospital delirium on chronic cognitive impairment:  - Delirium precautions  HTN: BP elevated, CrCl at baseline - Continue lisinopril, give prn hydralazine  Glaucoma: Severe, chronic, not acutely worsened.  - Continue home gtt's  Hypothyroidism:  - Continue synthroid 148mcg  Osteopenia:  - Hold home boniva for  now.   RBBB: Stable. No recent anginal symptoms or symptoms suggestive of CHF.  - Cardiology follow up could be considered.  - Continue telemetry monitoring perioperatively  Possible T2DM: This is reported though glucose is ok, pt not on medications that son knows of for it. Check HbA1c.  DVT prophylaxis: Heparin (or per orthopedics)  Code Status: Full, presumed  Family Communication: None at bedside. Son by phone Disposition Plan: TBD Consults called: Orthopedics, Dr. Mable Fill  Admission status: Inpatient    Patrecia Pour, MD [facility] [email or web address] 09/25/2021, 12:06 PM

## 2021-09-25 NOTE — Progress Notes (Addendum)
Pt arrived to 6N07 via bed. Pt alert and oriented to person only. Pt very anxious and yelling. This RN attempted to reassure and calm pt however ineffective. Unable to obtain VS at this time due to anxious. Will reassess.  Side rails up x2, bed alarm on, and floor mats placed.

## 2021-09-25 NOTE — ED Triage Notes (Signed)
Patient arrived with EMS from home lost her balance at 6 pm last night at home and landed on her right side . No LOC , history of dementia /confused , patient reports headache/right hip pain .

## 2021-09-25 NOTE — ED Provider Notes (Signed)
[facility] EMERGENCY DEPARTMENT Provider Note   CSN: [id number] Arrival date & time: 09/25/21  0141     History Chief Complaint  Patient presents with   Fall / Right Hip pain     Linda Copeland is a 87 y.o. female.  The history is provided by the patient, the EMS personnel, a relative and medical records.  Linda Copeland is a 87 y.o. female who presents to the Emergency Department complaining of fall.  She presents to the ED by EMS for evaluation of injuries following a fall.  Level V caveat due to hx/o dementia.  84 of hx obtained from patient's son.  Son reports she had an unwittnessed fall around 630pm at home. She was assisted back to bed but complained of hip pain. EMS was called and she was assessed but she declined transport.  She later complained of pain again and requested to go to hospital.  She thinks she struck her head.  Complains of head pain, right hip pain. No additional complaints.      Past Medical History:  Diagnosis Date   Arthritis    bilateral severe facet L4-L5, L5-S1   Carpal tunnel syndrome of right wrist    Chronic cervical radiculopathy    C5   Compression fracture of lumbar vertebra (HCC)    L2-L3   Environmental and seasonal allergies    Frequent urination    Hematoma of frontal scalp 10/06/2016   History of bronchitis    History of humerus fracture 10/06/2016   Right proximal   History of subdural hematoma 10/06/2016   Left falcotentorial    HOH (hard of hearing)    Hypertension    Hypothyroidism    Nasal fracture 10/06/2016   OA (osteoarthritis)    AC glenohumeral joinr   Osteopenia    Spinal stenosis at L4-L5 level     Patient Active Problem List   Diagnosis Date Noted   Hip fracture ([place]) 09/25/2021   Primary open angle glaucoma (POAG) of both eyes, severe stage 03/21/2019   Closed fracture of nasal bone with routine healing 10/11/2016   Subdural hematoma 10/05/2016    Past Surgical History:  Procedure  Laterality Date   CARPAL TUNNEL RELEASE Right 01/22/2018   Procedure: RIGHT CARPAL TUNNEL RELEASE;  Surgeon: Daryll Brod, MD;  Location: [place];  Service: Orthopedics;  Laterality: Right;   COLONOSCOPY       OB History   No obstetric history on file.     Family History  Problem Relation Age of Onset   Diabetes Son     Social History   Tobacco Use   Smoking status: Never   Smokeless tobacco: Never  Vaping Use   Vaping Use: Never used  Substance Use Topics   Alcohol use: Not Currently    Comment: rare    Drug use: Never    Home Medications Prior to Admission medications   Medication Sig Start Date End Date Taking? Authorizing Provider  brimonidine (ALPHAGAN) 0.2 % ophthalmic solution Place 1 drop into both eyes 3 (three) times daily. 04/18/19   [provider]  dorzolamide-timolol (COSOPT) 22.3-6.8 MG/ML ophthalmic solution Place 1 drop into the right eye 2 (two) times daily. 09/09/16   [provider]  ibandronate (BONIVA) 150 MG tablet Take 150 mg by mouth every 30 (thirty) days. 01/26/19   [provider]  levothyroxine (SYNTHROID, LEVOTHROID) 100 MCG tablet Take 100 mcg by mouth daily before breakfast. 09/09/16   [provider]  lisinopril (PRINIVIL,ZESTRIL) 20 MG tablet Take 20 mg by mouth daily. 09/09/16   [provider]  LUMIGAN 0.01 % SOLN Place 1 drop into both eyes at bedtime.  03/18/18   [provider]  RHOPRESSA 0.02 % SOLN Place 1 drop into both eyes at bedtime. 03/21/19   [provider]  traMADol (ULTRAM) 50 MG tablet Take 0.5-1 tablets (25-50 mg total) by mouth every 8 (eight) hours as needed. 01/11/21   Quintella Reichert, MD    Allergies    Codeine, Biaxin [clarithromycin], and Caffeine  Review of Systems   Review of Systems  All other systems reviewed and are negative.  Physical Exam Updated Vital Signs BP (!) 176/97    Pulse 88    Temp 98.2 F (36.8 C) (Temporal)    Resp (!)  26    Ht 5\' 1"  (1.549 m)    Wt 65 kg    SpO2 96%    BMI 27.08 kg/m   Physical Exam Vitals and nursing note reviewed.  Constitutional:      Appearance: She is well-developed.  HENT:     Head: Normocephalic and atraumatic.  Neck:     Comments: No cervical spine tenderness Cardiovascular:     Rate and Rhythm: Normal rate and regular rhythm.     Heart sounds: No murmur heard. Pulmonary:     Effort: Pulmonary effort is normal. No respiratory distress.     Breath sounds: Normal breath sounds.  Abdominal:     Palpations: Abdomen is soft.     Tenderness: There is no abdominal tenderness. There is no guarding or rebound.  Musculoskeletal:     Comments: 2+ right DP pulse. 1+ left DP pulse. RLE externally rotated and shortened.   Skin:    General: Skin is warm and dry.  Neurological:     Mental Status: She is alert.     Comments: Confused.  Disoriented to time, place and recent events.  Wiggles toes bilaterally .  Psychiatric:        Behavior: Behavior normal.    ED Results / Procedures / Treatments   Labs (all labs ordered are listed, but only abnormal results are displayed) Labs Reviewed  COMPREHENSIVE METABOLIC PANEL - Abnormal; Notable for the following components:      Result Value   Sodium 133 (*)    CO2 21 (*)    Glucose, Bld 133 (*)    All other components within normal limits  CBC WITH DIFFERENTIAL/PLATELET - Abnormal; Notable for the following components:   Neutro Abs 8.6 (*)    Abs Immature Granulocytes 0.08 (*)    All other components within normal limits  URINALYSIS, ROUTINE W REFLEX MICROSCOPIC - Abnormal; Notable for the following components:   APPearance CLOUDY (*)    Hgb urine dipstick TRACE (*)    Leukocytes,Ua TRACE (*)    All other components within normal limits  URINALYSIS, MICROSCOPIC (REFLEX) - Abnormal; Notable for the following components:   Bacteria, UA FEW (*)    All other components within normal limits  RESP PANEL BY RT-PCR (FLU A&B, COVID)  ARPGX2    EKG EKG Interpretation  Date/Time:  Sunday September 25 2021 02:18:52 EST Ventricular Rate:  91 PR Interval:  202 QRS Duration: 120 QT Interval:  373 QTC Calculation: 459 R Axis:   46 Text Interpretation: Sinus rhythm IRBBB and LPFB Inferior infarct, old Confirmed by Quintella Reichert [id number]) on 09/25/2021 2:21:17 AM  Radiology DG Chest 1 View  Result Date:  09/25/2021 CLINICAL DATA:  Fall and confusion EXAM: CHEST  1 VIEW COMPARISON:  07/09/2019 FINDINGS: The heart size and mediastinal contours are within normal limits. Both lungs are clear. The visualized skeletal structures are unremarkable. IMPRESSION: No active disease. Electronically Signed   By: Ulyses Jarred M.D.   On: 09/25/2021 03:03   CT Head Wo Contrast  Result Date: 09/25/2021 CLINICAL DATA:  Recent fall several hours ago with headaches and neck pain, initial encounter EXAM: CT HEAD WITHOUT CONTRAST CT CERVICAL SPINE WITHOUT CONTRAST TECHNIQUE: Multidetector CT imaging of the head and cervical spine was performed following the standard protocol without intravenous contrast. Multiplanar CT image reconstructions of the cervical spine were also generated. COMPARISON:  None. FINDINGS: CT HEAD FINDINGS Brain: No evidence of acute infarction, hemorrhage, hydrocephalus, extra-axial collection or mass lesion/mass effect. Chronic atrophic and ischemic changes are noted. Vascular: No hyperdense vessel or unexpected calcification. Skull: Normal. Negative for fracture or focal lesion. Sinuses/Orbits: No acute finding. Other: None. CT CERVICAL SPINE FINDINGS Alignment: Mild degenerative anterolisthesis of C7 on T1 is noted. Skull base and vertebrae: 7 cervical segments are well visualized. Vertebral body height is well maintained. Irregularity along the superior aspect of the C6 vertebral body is noted with associated osteophytic changes consistent with degenerative change. No compression deformity is noted. Facet hypertrophic changes  are noted. No acute fracture or acute facet abnormality is noted. Soft tissues and spinal canal: Surrounding soft tissue structures are within normal limits. Upper chest: Visualized lung apices demonstrate a small 3-4 mm nodule in the right upper lobe best seen on image number 72 of series 6. Other: None IMPRESSION: CT of the head: Chronic atrophic and ischemic changes. CT of cervical spine: Multilevel degenerative change. 3-4 mm nodule in the right upper lobe. No follow-up needed if patient is low-risk. Non-contrast chest CT can be considered in 12 months if patient is high-risk. This recommendation follows the consensus statement: Guidelines for Management of Incidental Pulmonary Nodules Detected on CT Images: From the Fleischner Society 2017; Radiology 2017; 284:228-243. Electronically Signed   By: Inez Catalina M.D.   On: 09/25/2021 03:19   CT Cervical Spine Wo Contrast  Result Date: 09/25/2021 CLINICAL DATA:  Recent fall several hours ago with headaches and neck pain, initial encounter EXAM: CT HEAD WITHOUT CONTRAST CT CERVICAL SPINE WITHOUT CONTRAST TECHNIQUE: Multidetector CT imaging of the head and cervical spine was performed following the standard protocol without intravenous contrast. Multiplanar CT image reconstructions of the cervical spine were also generated. COMPARISON:  None. FINDINGS: CT HEAD FINDINGS Brain: No evidence of acute infarction, hemorrhage, hydrocephalus, extra-axial collection or mass lesion/mass effect. Chronic atrophic and ischemic changes are noted. Vascular: No hyperdense vessel or unexpected calcification. Skull: Normal. Negative for fracture or focal lesion. Sinuses/Orbits: No acute finding. Other: None. CT CERVICAL SPINE FINDINGS Alignment: Mild degenerative anterolisthesis of C7 on T1 is noted. Skull base and vertebrae: 7 cervical segments are well visualized. Vertebral body height is well maintained. Irregularity along the superior aspect of the C6 vertebral body is noted  with associated osteophytic changes consistent with degenerative change. No compression deformity is noted. Facet hypertrophic changes are noted. No acute fracture or acute facet abnormality is noted. Soft tissues and spinal canal: Surrounding soft tissue structures are within normal limits. Upper chest: Visualized lung apices demonstrate a small 3-4 mm nodule in the right upper lobe best seen on image number 72 of series 6. Other: None IMPRESSION: CT of the head: Chronic atrophic and ischemic changes. CT of cervical  spine: Multilevel degenerative change. 3-4 mm nodule in the right upper lobe. No follow-up needed if patient is low-risk. Non-contrast chest CT can be considered in 12 months if patient is high-risk. This recommendation follows the consensus statement: Guidelines for Management of Incidental Pulmonary Nodules Detected on CT Images: From the Fleischner Society 2017; Radiology 2017; 284:228-243. Electronically Signed   By: Inez Catalina M.D.   On: 09/25/2021 03:19   DG Hip Unilat W or Wo Pelvis 2-3 Views Right  Result Date: 09/25/2021 CLINICAL DATA:  Fall and right hip pain. EXAM: DG HIP (WITH OR WITHOUT PELVIS) 2-3V RIGHT COMPARISON:  Right hip radiograph dated 01/11/2021. FINDINGS: There is a fracture of the right femoral neck with mild proximal migration and impaction of the femoral shaft. No dislocation. The bones are osteopenic. The soft tissues are unremarkable. Vascular calcifications noted. IMPRESSION: Mildly displaced and impacted right femoral neck fracture. Electronically Signed   By: Anner Crete M.D.   On: 09/25/2021 02:55    Procedures Procedures   Medications Ordered in ED Medications  fentaNYL (SUBLIMAZE) injection 25 mcg (25 mcg Intravenous Given 09/25/21 0451)    ED Course  I have reviewed the triage vital signs and the nursing notes.  Pertinent labs & imaging results that were available during my care of the patient were reviewed by me and considered in my medical  decision making (see chart for details).    MDM Rules/Calculators/A&P                         Patient here for evaluation of injuries following a fall that occurred yesterday.  She has pain to the right hip with external rotation and shortening.  Imaging is significant for a mildly displaced and impacted right femoral neck fracture.  She does complain of head pain, states she hit her head.  No external evidence of trauma on examination.  CT scan of head and neck were obtained due to her advanced age.  Imaging is negative for acute injury secondary to the fall.  Discussed with patient and son findings of images and need for admission for further management.  Discussed with Dr. Mable Fill with orthopedics, he will see the patient in consult.  Medicine consulted for admission for ongoing treatment.    Final Clinical Impression(s) / ED Diagnoses Final diagnoses:  Closed fracture of neck of right femur, initial encounter ([place])  Fall, initial encounter    Rx / DC Orders ED Discharge Orders     None        Quintella Reichert, MD 09/25/21 [phone]

## 2021-09-25 NOTE — ED Notes (Signed)
Peri care preformed and pt provided with a fresh brief. Purewick was replaced.

## 2021-09-25 NOTE — Progress Notes (Signed)
[facility] hospitalist notified via chat she is refusing to have telemetry placed

## 2021-09-25 NOTE — Consult Note (Signed)
Orthopaedic Consult  Date/Time: 09/25/21 9:02 AM  Patient Name: Linda Copeland  Attending Physician: Patrecia Pour, MD   Time first seen by orthopaedics: 44  ASSESSMENT & PLAN  Orthopaedic Assessment: 87 y.o. female with advanced dementia and right displaced femoral neck fracture.  Reductions/Procedures/Splinting/Anesthesia Performed: Reductions: None Splinting/casting: None Procedure(s): None Anesthesia: N/A  Plan: Given patient's apparent previous ambulatory status, would likely benefit from right hemiarthroplasty versus total hip arthroplasty.  Will try to get this done 09/26/2021 or 09/27/2021 at the latest.  Surgery may not be with me, and may also need to transfer her to [place] depending on Psychologist, sport and exercise and OR availability.   Georgeanna Harrison M.D. Orthopaedic Surgery [facility]   Medical Decision Making  Amount/complexity of data: Is there a pathologic fracture (e.g. neoplastic, osteoporotic insufficiency fracture)? Yes Independent interpretation of radiographic studies: Yes Review of radiology results (e.g. reports): Yes Tests ordered (e.g. additional radiographic studies, labs): No Lab results reviewed: Yes Reviewed old records: Yes History from another source (independent historian, e.g. family/friend/etc.): Yes Risk: Patient receiving IV controlled substances for pain: No Fracture requiring manipulation: No Urgent or emergent (non-elective) surgery likely this admission: Yes Presence of medical comorbidities and/or surgical risk factors (e.g. current smoker, CAD, diabetes, COPD, CKD, etc.): Yes Closed fracture management WITHOUT manipulation: No Urgent minor procedure (e.g. joint aspiration, compartment pressure measurement, etc.): No Will likely need surgery as an outpatient: No     HPI Linda Copeland is a 87 y.o. female. Orthopaedic consultation has specifically been requested to address this patient's current musculoskeletal  presentation.  Patient unable to provide history due to dementia and mental status. Son contacted for history.  Apparently patient did sustain a mechanical fall several weeks back and had a reduction in ambulatory capacity after that event, but remained ambulatory up until her injury late last night.  She did not require any assistive devices. She sustained a fall and had immediate pain in the right hip/thigh.  Pain is sharp and severe.  Pain is worse movement and better with rest and immobilization.  Since the fall the patient was not able to ambulate or bear weight due to pain.  At baseline the patient is ambulatory with no assistive devices.      PMH Past Medical History:  Diagnosis Date   Arthritis    bilateral severe facet L4-L5, L5-S1   Carpal tunnel syndrome of right wrist    Chronic cervical radiculopathy    C5   Compression fracture of lumbar vertebra (HCC)    L2-L3   Environmental and seasonal allergies    Frequent urination    Hematoma of frontal scalp 10/06/2016   History of bronchitis    History of humerus fracture 10/06/2016   Right proximal   History of subdural hematoma 10/06/2016   Left falcotentorial    HOH (hard of hearing)    Hypertension    Hypothyroidism    Nasal fracture 10/06/2016   OA (osteoarthritis)    AC glenohumeral joinr   Osteopenia    Spinal stenosis at L4-L5 level      PSH Past Surgical History:  Procedure Laterality Date   CARPAL TUNNEL RELEASE Right 01/22/2018   Procedure: RIGHT CARPAL TUNNEL RELEASE;  Surgeon: Daryll Brod, MD;  Location: [facility];  Service: Orthopedics;  Laterality: Right;   COLONOSCOPY     Home Medications Prior to Admission medications   Medication Sig Start Date End Date Taking? Authorizing Provider  dorzolamide (TRUSOPT) 2 % ophthalmic solution Place  1 drop into the right eye 2 (two) times daily. 08/06/21  Yes [provider]  ibandronate (BONIVA) 150 MG tablet Take 150 mg by mouth every 30  (thirty) days. 01/26/19  Yes [provider]  levothyroxine (SYNTHROID, LEVOTHROID) 100 MCG tablet Take 100 mcg by mouth daily before breakfast. 09/09/16  Yes [provider]  lisinopril (PRINIVIL,ZESTRIL) 20 MG tablet Take 20 mg by mouth daily. 09/09/16  Yes [provider]  LUMIGAN 0.01 % SOLN Place 1 drop into both eyes at bedtime.  03/18/18  Yes [provider]  meloxicam (MOBIC) 7.5 MG tablet Take 7.5 mg by mouth daily. 09/17/21  Yes [provider]     Allergies Allergies  Allergen Reactions   Codeine Nausea And Vomiting and Other (See Comments)    uknown   Biaxin [Clarithromycin] Other (See Comments)    Sweats   Caffeine     Shaky     Family History Family History  Problem Relation Age of Onset   Diabetes Son     Social History Social History   Socioeconomic History   Marital status: Widowed    Spouse name: Not on file   Number of children: Not on file   Years of education: Not on file   Highest education level: Not on file  Occupational History   Not on file  Tobacco Use   Smoking status: Never   Smokeless tobacco: Never  Vaping Use   Vaping Use: Never used  Substance and Sexual Activity   Alcohol use: Not Currently    Comment: rare    Drug use: Never   Sexual activity: Not on file  Other Topics Concern   Not on file  Social History Narrative   Not on file   Social Determinants of Health   Financial Resource Strain: Not on file  Food Insecurity: Not on file  Transportation Needs: Not on file  Physical Activity: Not on file  Stress: Not on file  Social Connections: Not on file  Intimate Partner Violence: Not on file     Review of Systems MSK: As noted per HPI above GI: No current Nausea/vomiting ENT: Denies sore throat, epistaxis CV: Denies chest pain Resp: No current shortness of breath  Other than mentioned above, there are no Constitutional, Neurological, Psychiatric, ENT, Ophthalmological,  Cardiovascular, Respiratory, GI, GU, Musculoskeletal, Integumentary, Lymphatic, Endocrine or Allergic issues.     Imaging  Independent interpretation of orthopaedic-relevant films: Multiple views the right hip and pelvis: Displaced right femoral neck fracture.  Radiographic results: DG Chest 1 View  Result Date: 09/25/2021 CLINICAL DATA:  Fall and confusion EXAM: CHEST  1 VIEW COMPARISON:  07/09/2019 FINDINGS: The heart size and mediastinal contours are within normal limits. Both lungs are clear. The visualized skeletal structures are unremarkable. IMPRESSION: No active disease. Electronically Signed   By: Ulyses Jarred M.D.   On: 09/25/2021 03:03   CT Head Wo Contrast  Result Date: 09/25/2021 CLINICAL DATA:  Recent fall several hours ago with headaches and neck pain, initial encounter EXAM: CT HEAD WITHOUT CONTRAST CT CERVICAL SPINE WITHOUT CONTRAST TECHNIQUE: Multidetector CT imaging of the head and cervical spine was performed following the standard protocol without intravenous contrast. Multiplanar CT image reconstructions of the cervical spine were also generated. COMPARISON:  None. FINDINGS: CT HEAD FINDINGS Brain: No evidence of acute infarction, hemorrhage, hydrocephalus, extra-axial collection or mass lesion/mass effect. Chronic atrophic and ischemic changes are noted. Vascular: No hyperdense vessel or unexpected calcification. Skull: Normal. Negative for fracture  or focal lesion. Sinuses/Orbits: No acute finding. Other: None. CT CERVICAL SPINE FINDINGS Alignment: Mild degenerative anterolisthesis of C7 on T1 is noted. Skull base and vertebrae: 7 cervical segments are well visualized. Vertebral body height is well maintained. Irregularity along the superior aspect of the C6 vertebral body is noted with associated osteophytic changes consistent with degenerative change. No compression deformity is noted. Facet hypertrophic changes are noted. No acute fracture or acute facet abnormality is  noted. Soft tissues and spinal canal: Surrounding soft tissue structures are within normal limits. Upper chest: Visualized lung apices demonstrate a small 3-4 mm nodule in the right upper lobe best seen on image number 72 of series 6. Other: None IMPRESSION: CT of the head: Chronic atrophic and ischemic changes. CT of cervical spine: Multilevel degenerative change. 3-4 mm nodule in the right upper lobe. No follow-up needed if patient is low-risk. Non-contrast chest CT can be considered in 12 months if patient is high-risk. This recommendation follows the consensus statement: Guidelines for Management of Incidental Pulmonary Nodules Detected on CT Images: From the Fleischner Society 2017; Radiology 2017; 284:228-243. Electronically Signed   By: Inez Catalina M.D.   On: 09/25/2021 03:19   CT Cervical Spine Wo Contrast  Result Date: 09/25/2021 CLINICAL DATA:  Recent fall several hours ago with headaches and neck pain, initial encounter EXAM: CT HEAD WITHOUT CONTRAST CT CERVICAL SPINE WITHOUT CONTRAST TECHNIQUE: Multidetector CT imaging of the head and cervical spine was performed following the standard protocol without intravenous contrast. Multiplanar CT image reconstructions of the cervical spine were also generated. COMPARISON:  None. FINDINGS: CT HEAD FINDINGS Brain: No evidence of acute infarction, hemorrhage, hydrocephalus, extra-axial collection or mass lesion/mass effect. Chronic atrophic and ischemic changes are noted. Vascular: No hyperdense vessel or unexpected calcification. Skull: Normal. Negative for fracture or focal lesion. Sinuses/Orbits: No acute finding. Other: None. CT CERVICAL SPINE FINDINGS Alignment: Mild degenerative anterolisthesis of C7 on T1 is noted. Skull base and vertebrae: 7 cervical segments are well visualized. Vertebral body height is well maintained. Irregularity along the superior aspect of the C6 vertebral body is noted with associated osteophytic changes consistent with  degenerative change. No compression deformity is noted. Facet hypertrophic changes are noted. No acute fracture or acute facet abnormality is noted. Soft tissues and spinal canal: Surrounding soft tissue structures are within normal limits. Upper chest: Visualized lung apices demonstrate a small 3-4 mm nodule in the right upper lobe best seen on image number 72 of series 6. Other: None IMPRESSION: CT of the head: Chronic atrophic and ischemic changes. CT of cervical spine: Multilevel degenerative change. 3-4 mm nodule in the right upper lobe. No follow-up needed if patient is low-risk. Non-contrast chest CT can be considered in 12 months if patient is high-risk. This recommendation follows the consensus statement: Guidelines for Management of Incidental Pulmonary Nodules Detected on CT Images: From the Fleischner Society 2017; Radiology 2017; 284:228-243. Electronically Signed   By: Inez Catalina M.D.   On: 09/25/2021 03:19   DG Hip Unilat W or Wo Pelvis 2-3 Views Right  Result Date: 09/25/2021 CLINICAL DATA:  Fall and right hip pain. EXAM: DG HIP (WITH OR WITHOUT PELVIS) 2-3V RIGHT COMPARISON:  Right hip radiograph dated 01/11/2021. FINDINGS: There is a fracture of the right femoral neck with mild proximal migration and impaction of the femoral shaft. No dislocation. The bones are osteopenic. The soft tissues are unremarkable. Vascular calcifications noted. IMPRESSION: Mildly displaced and impacted right femoral neck fracture. Electronically Signed   By:  Anner Crete M.D.   On: 09/25/2021 02:55   Labs  Recent Labs    09/25/21 0225  WBC 10.3  HGB 13.6  HCT 42.1  PLT 207   Recent Labs    09/25/21 0225  NA 133*  K 3.8  CL 102  CO2 21*  BUN 11  CREATININE 0.52  GLUCOSE 133*  CALCIUM 9.0   Lab Results  Component Value Date   INR 1.10 10/05/2016        Physical Examination  Patient is a 87 y.o. year old female who is chronically ill appearing, mood is calm.  Orientation:  not  oriented  Vital Signs: BP (!) 162/82 (BP Location: Right Arm)    Pulse 94    Temp (!) 97.4 F (36.3 C) (Oral)    Resp 16    Ht 5\' 1"  (1.549 m)    Wt 65 kg    SpO2 96%    BMI 27.08 kg/m    Gait: Unable to ambulate  Heart: Normal rate Lungs: Non-labored breathing Abdomen: Soft, Non-tender   Right Upper Extremity: Inspection: Atraumatic Palpation: Nontender ROM: Apparently full and painless Joint Stability: No instability Strength: Unable to comply with exam due to mental status Skin: Intact Peripheral Vascular: Well perfused Reflexes: No pathologic Sensation: Unable to comply with exam due to mental status Lymph Nodes: None Palpable Coordination: Unable to comply with exam due to mental status   Left Upper Extremity: Inspection: Atraumatic Palpation: Nontender ROM: Apparently full and painless Joint Stability: No instability Strength: Unable to comply with exam due to mental status Skin: Intact Peripheral Vascular: Well perfused Reflexes: No pathologic Sensation: Unable to comply with exam due to mental status Lymph Nodes: None Palpable Coordination: Unable to comply with exam due to mental status  Right Lower Extremity: Inspection: Atraumatic appearance Palpation: Nontender ROM: Apparently full and painless Joint Stability: No instability Strength: Unable to comply with exam due to mental status Skin: Intact Peripheral Vascular: Well perfused Reflexes: No pathologic Sensation: Unable to comply with exam due to mental status Lymph Nodes: None Palpable Coordination: Unable to comply with exam due to mental status Left Lower Extremity: Inspection: Atraumatic Palpation: Nontender ROM: Apparently full and painless Joint Stability: No instability Strength: Unable to comply with exam due to mental status Skin: Intact Peripheral Vascular: Well perfused Reflexes: No pathologic Sensation: Unable to comply with exam due to mental status Lymph Nodes: None  Palpable Coordination: Unable to comply with exam due to mental status   Pelvis: Skin: Intact Palpation: Nontender  Stability: No instability      The review of the patient's medications does not in any way constitute an endorsement, by this clinician,  of their use, dosage, indications, route, efficacy, interactions, or other clinical parameters.  This note was generated within the EP[employer] EMR using Dragon medical speech recognition software and may contain inherent errors or omissions not intended by the user. Grammatical and punctuation errors, random word insertions, deletions, pronoun errors and incomplete sentences are occasional consequences of this technology due to software limitations. Not all errors are caught or corrected.  Although every attempt is made to root out erroneus and incomplete transcription, the note may still not fully represent the intent or opinion of the author. If there are questions or concerns about the content of this note or information contained within the body of this dictation they should be addressed directly with the author for clarification.

## 2021-09-25 NOTE — Progress Notes (Signed)
[facility] MD on call chat concerning patient refusing to wear telemetry. Informed that day shift RN reported that MD was aware that patient had refused to wear the monitor and was ok to leave it off. I did manage to get it on but patient was attempting to scratch me. MD made aware that patient is confused. Will continue to monitor. Arthor Captain LPN

## 2021-09-25 NOTE — ED Notes (Signed)
Patient transported to CT scan . 

## 2021-09-26 ENCOUNTER — Inpatient Hospital Stay (HOSPITAL_COMMUNITY): Payer: Medicare HMO

## 2021-09-26 ENCOUNTER — Inpatient Hospital Stay (HOSPITAL_COMMUNITY): Payer: Medicare HMO | Admitting: Anesthesiology

## 2021-09-26 ENCOUNTER — Encounter (HOSPITAL_COMMUNITY)
Admission: EM | Disposition: A | Discharge status: Skilled Nursing Facility | Payer: Self-pay | Source: Home / Self Care | Attending: Student

## 2021-09-26 ENCOUNTER — Encounter (HOSPITAL_COMMUNITY): Payer: Self-pay | Admitting: Family Medicine

## 2021-09-26 ENCOUNTER — Encounter (HOSPITAL_COMMUNITY): Payer: Self-pay | Admitting: Certified Registered"

## 2021-09-26 DIAGNOSIS — M8580 Other specified disorders of bone density and structure, unspecified site: Secondary | ICD-10-CM | POA: Diagnosis not present

## 2021-09-26 DIAGNOSIS — E039 Hypothyroidism, unspecified: Secondary | ICD-10-CM | POA: Diagnosis not present

## 2021-09-26 DIAGNOSIS — S72001A Fracture of unspecified part of neck of right femur, initial encounter for closed fracture: Secondary | ICD-10-CM | POA: Diagnosis not present

## 2021-09-26 DIAGNOSIS — I10 Essential (primary) hypertension: Secondary | ICD-10-CM | POA: Diagnosis not present

## 2021-09-26 HISTORY — PX: TOTAL HIP ARTHROPLASTY: SHX124

## 2021-09-26 LAB — CBC
HCT: 39.4 % (ref 36.0–46.0)
Hemoglobin: 13.2 g/dL (ref 12.0–15.0)
MCH: 30.3 pg (ref 26.0–34.0)
MCHC: 33.5 g/dL (ref 30.0–36.0)
MCV: 90.6 fL (ref 80.0–100.0)
Platelets: 184 10*3/uL (ref 150–400)
RBC: 4.35 MIL/uL (ref 3.87–5.11)
RDW: 13 % (ref 11.5–15.5)
WBC: 11 10*3/uL — ABNORMAL HIGH (ref 4.0–10.5)
nRBC: 0 % (ref 0.0–0.2)

## 2021-09-26 LAB — BASIC METABOLIC PANEL
Anion gap: 10 (ref 5–15)
BUN: 9 mg/dL (ref 8–23)
CO2: 22 mmol/L (ref 22–32)
Calcium: 8.7 mg/dL — ABNORMAL LOW (ref 8.9–10.3)
Chloride: 101 mmol/L (ref 98–111)
Creatinine, Ser: 0.43 mg/dL — ABNORMAL LOW (ref 0.44–1.00)
GFR, Estimated: >60 mL/min (ref >60)
Glucose, Bld: 104 mg/dL — ABNORMAL HIGH (ref 70–99)
Potassium: 3.6 mmol/L (ref 3.5–5.1)
Sodium: 133 mmol/L — ABNORMAL LOW (ref 135–145)

## 2021-09-26 LAB — TSH: TSH: 2.046 u[IU]/mL (ref 0.350–4.500)

## 2021-09-26 LAB — TYPE AND SCREEN
ABO/RH(D): O POS
Antibody Screen: NEGATIVE

## 2021-09-26 LAB — SURGICAL PCR SCREEN
MRSA, PCR: NEGATIVE
Staphylococcus aureus: NEGATIVE

## 2021-09-26 SURGERY — ARTHROPLASTY, HIP, TOTAL, ANTERIOR APPROACH
Anesthesia: General | Site: Hip | Laterality: Right

## 2021-09-26 MED ORDER — ROCURONIUM BROMIDE 100 MG/10ML IV SOLN
INTRAVENOUS | Status: DC | PRN
  Administered 2021-09-26: 50 mg via INTRAVENOUS

## 2021-09-26 MED ORDER — MENTHOL 3 MG MT LOZG: 1.0000 | LOZENGE | OROMUCOSAL | Status: DC | PRN

## 2021-09-26 MED ORDER — METHOCARBAMOL 1000 MG/10ML IJ SOLN
500.0000 mg | Freq: Four times a day (QID) | INTRAVENOUS | Status: DC | PRN
  Filled 2021-09-26: qty 5

## 2021-09-26 MED ORDER — SODIUM CHLORIDE 0.9 % IV SOLN: INTRAVENOUS | Status: DC

## 2021-09-26 MED ORDER — KETOROLAC TROMETHAMINE 30 MG/ML IJ SOLN
INTRAMUSCULAR | Status: AC
  Filled 2021-09-26: qty 1

## 2021-09-26 MED ORDER — SODIUM CHLORIDE (PF) 0.9 % IJ SOLN
INTRAMUSCULAR | Status: AC
  Filled 2021-09-26: qty 10

## 2021-09-26 MED ORDER — SODIUM CHLORIDE (PF) 0.9 % IJ SOLN
INTRAMUSCULAR | Status: AC
  Filled 2021-09-26: qty 30

## 2021-09-26 MED ORDER — TRANEXAMIC ACID-NACL 1000-0.7 MG/100ML-% IV SOLN
1000.0000 mg | INTRAVENOUS | Status: AC
  Administered 2021-09-26: 14:00:00 1000 mg via INTRAVENOUS
  Filled 2021-09-26: qty 100

## 2021-09-26 MED ORDER — ONDANSETRON HCL 4 MG/2ML IJ SOLN
INTRAMUSCULAR | Status: DC | PRN
  Administered 2021-09-26: 4 mg via INTRAVENOUS

## 2021-09-26 MED ORDER — METHOCARBAMOL 500 MG PO TABS: 500.0000 mg | ORAL_TABLET | Freq: Four times a day (QID) | ORAL | Status: DC | PRN

## 2021-09-26 MED ORDER — SENNA 8.6 MG PO TABS
1.0000 | ORAL_TABLET | Freq: Two times a day (BID) | ORAL | Status: DC
  Administered 2021-09-26 – 2021-09-30 (×7): 8.6 mg via ORAL
  Filled 2021-09-26 (×8): qty 1

## 2021-09-26 MED ORDER — LACTATED RINGERS IV SOLN: INTRAVENOUS | Status: DC | PRN

## 2021-09-26 MED ORDER — POVIDONE-IODINE 10 % EX SWAB: 2.0000 "application " | Freq: Once | CUTANEOUS | Status: AC

## 2021-09-26 MED ORDER — POLYETHYLENE GLYCOL 3350 17 G PO PACK: 17.0000 g | PACK | Freq: Every day | ORAL | Status: DC | PRN

## 2021-09-26 MED ORDER — DEXAMETHASONE SODIUM PHOSPHATE 10 MG/ML IJ SOLN
INTRAMUSCULAR | Status: DC | PRN
  Administered 2021-09-26: 4 mg via INTRAVENOUS

## 2021-09-26 MED ORDER — SORBITOL 70 % SOLN
30.0000 mL | Freq: Every day | Status: DC | PRN
  Filled 2021-09-26: qty 30

## 2021-09-26 MED ORDER — CHLORHEXIDINE GLUCONATE CLOTH 2 % EX PADS
6.0000 | MEDICATED_PAD | Freq: Every day | CUTANEOUS | Status: DC
  Administered 2021-09-26: 23:00:00 6 via TOPICAL

## 2021-09-26 MED ORDER — ONDANSETRON HCL 4 MG/2ML IJ SOLN: 4.0000 mg | Freq: Four times a day (QID) | INTRAMUSCULAR | Status: DC | PRN

## 2021-09-26 MED ORDER — BUPIVACAINE-EPINEPHRINE (PF) 0.25% -1:200000 IJ SOLN
INTRAMUSCULAR | Status: AC
  Filled 2021-09-26: qty 30

## 2021-09-26 MED ORDER — WATER FOR IRRIGATION, STERILE IR SOLN
Status: DC | PRN
  Administered 2021-09-26: 2000 mL

## 2021-09-26 MED ORDER — METOCLOPRAMIDE HCL 5 MG PO TABS: 5.0000 mg | ORAL_TABLET | Freq: Three times a day (TID) | ORAL | Status: DC | PRN

## 2021-09-26 MED ORDER — MORPHINE SULFATE (PF) 2 MG/ML IV SOLN: 0.5000 mg | INTRAVENOUS | Status: DC | PRN

## 2021-09-26 MED ORDER — FENTANYL CITRATE (PF) 100 MCG/2ML IJ SOLN
INTRAMUSCULAR | Status: DC | PRN
  Administered 2021-09-26: 50 ug via INTRAVENOUS
  Administered 2021-09-26 (×2): 25 ug via INTRAVENOUS

## 2021-09-26 MED ORDER — KETOROLAC TROMETHAMINE 30 MG/ML IJ SOLN
INTRAMUSCULAR | Status: DC | PRN
  Administered 2021-09-26: 30 mg

## 2021-09-26 MED ORDER — FENTANYL CITRATE (PF) 100 MCG/2ML IJ SOLN
INTRAMUSCULAR | Status: AC
  Filled 2021-09-26: qty 2

## 2021-09-26 MED ORDER — METOCLOPRAMIDE HCL 5 MG/ML IJ SOLN: 5.0000 mg | Freq: Three times a day (TID) | INTRAMUSCULAR | Status: DC | PRN

## 2021-09-26 MED ORDER — POVIDONE-IODINE 10 % EX SWAB: 2.0000 "application " | Freq: Once | CUTANEOUS | Status: DC

## 2021-09-26 MED ORDER — TRANEXAMIC ACID-NACL 1000-0.7 MG/100ML-% IV SOLN
1000.0000 mg | Freq: Once | INTRAVENOUS | Status: AC
  Administered 2021-09-26: 18:00:00 1000 mg via INTRAVENOUS
  Filled 2021-09-26: qty 100

## 2021-09-26 MED ORDER — SODIUM CHLORIDE (PF) 0.9 % IJ SOLN
INTRAMUSCULAR | Status: AC
  Filled 2021-09-26: qty 20

## 2021-09-26 MED ORDER — PHENYLEPHRINE 40 MCG/ML (10ML) SYRINGE FOR IV PUSH (FOR BLOOD PRESSURE SUPPORT)
PREFILLED_SYRINGE | INTRAVENOUS | Status: DC | PRN
  Administered 2021-09-26 (×3): 80 ug via INTRAVENOUS

## 2021-09-26 MED ORDER — FENTANYL CITRATE PF 50 MCG/ML IJ SOSY: 25.0000 ug | PREFILLED_SYRINGE | INTRAMUSCULAR | Status: DC | PRN

## 2021-09-26 MED ORDER — CHLORHEXIDINE GLUCONATE 4 % EX LIQD: 60.0000 mL | Freq: Once | CUTANEOUS | Status: DC

## 2021-09-26 MED ORDER — FLEET ENEMA 7-19 GM/118ML RE ENEM: 1.0000 | ENEMA | Freq: Once | RECTAL | Status: DC | PRN

## 2021-09-26 MED ORDER — ACETAMINOPHEN 325 MG PO TABS
325.0000 mg | ORAL_TABLET | Freq: Four times a day (QID) | ORAL | Status: DC | PRN
  Administered 2021-09-27: 08:00:00 650 mg via ORAL
  Administered 2021-09-28: 04:00:00 325 mg via ORAL
  Filled 2021-09-26: qty 1
  Filled 2021-09-26: qty 2

## 2021-09-26 MED ORDER — TRAMADOL HCL 50 MG PO TABS
50.0000 mg | ORAL_TABLET | Freq: Four times a day (QID) | ORAL | Status: DC
  Administered 2021-09-26 – 2021-09-28 (×6): 50 mg via ORAL
  Filled 2021-09-26 (×6): qty 1

## 2021-09-26 MED ORDER — ISOPROPYL ALCOHOL 70 % SOLN
Status: DC | PRN
  Administered 2021-09-26: 1 via TOPICAL

## 2021-09-26 MED ORDER — HYDROCODONE-ACETAMINOPHEN 7.5-325 MG PO TABS: 1.0000 | ORAL_TABLET | ORAL | Status: DC | PRN

## 2021-09-26 MED ORDER — ISOPROPYL ALCOHOL 70 % SOLN
Status: AC
  Filled 2021-09-26: qty 480

## 2021-09-26 MED ORDER — DOCUSATE SODIUM 100 MG PO CAPS
100.0000 mg | ORAL_CAPSULE | Freq: Two times a day (BID) | ORAL | Status: DC
  Administered 2021-09-27 – 2021-09-28 (×2): 100 mg via ORAL
  Filled 2021-09-26 (×4): qty 1

## 2021-09-26 MED ORDER — SODIUM CHLORIDE (PF) 0.9 % IJ SOLN
INTRAMUSCULAR | Status: DC | PRN
  Administered 2021-09-26: 30 mL

## 2021-09-26 MED ORDER — SODIUM CHLORIDE 0.9 % IR SOLN
Status: DC | PRN
  Administered 2021-09-26: 1000 mL

## 2021-09-26 MED ORDER — ASPIRIN EC 325 MG PO TBEC
325.0000 mg | DELAYED_RELEASE_TABLET | Freq: Every day | ORAL | Status: DC
  Administered 2021-09-27 – 2021-09-29 (×3): 325 mg via ORAL
  Filled 2021-09-26 (×3): qty 1

## 2021-09-26 MED ORDER — ONDANSETRON HCL 4 MG PO TABS: 4.0000 mg | ORAL_TABLET | Freq: Four times a day (QID) | ORAL | Status: DC | PRN

## 2021-09-26 MED ORDER — LIDOCAINE 2% (20 MG/ML) 5 ML SYRINGE
INTRAMUSCULAR | Status: DC | PRN
  Administered 2021-09-26: 60 mg via INTRAVENOUS

## 2021-09-26 MED ORDER — SUGAMMADEX SODIUM 200 MG/2ML IV SOLN
INTRAVENOUS | Status: DC | PRN
  Administered 2021-09-26: 150 mg via INTRAVENOUS

## 2021-09-26 MED ORDER — PHENOL 1.4 % MT LIQD: 1.0000 | OROMUCOSAL | Status: DC | PRN

## 2021-09-26 MED ORDER — CEFAZOLIN SODIUM-DEXTROSE 2-4 GM/100ML-% IV SOLN
2.0000 g | INTRAVENOUS | Status: AC
  Administered 2021-09-26: 14:00:00 2 g via INTRAVENOUS
  Filled 2021-09-26: qty 100

## 2021-09-26 MED ORDER — PROPOFOL 10 MG/ML IV BOLUS
INTRAVENOUS | Status: DC | PRN
  Administered 2021-09-26: 50 mg via INTRAVENOUS

## 2021-09-26 MED ORDER — CEFAZOLIN SODIUM-DEXTROSE 2-4 GM/100ML-% IV SOLN
2.0000 g | Freq: Four times a day (QID) | INTRAVENOUS | Status: AC
  Administered 2021-09-26 – 2021-09-27 (×2): 2 g via INTRAVENOUS
  Filled 2021-09-26 (×3): qty 100

## 2021-09-26 MED ORDER — HYDROCODONE-ACETAMINOPHEN 5-325 MG PO TABS
1.0000 | ORAL_TABLET | ORAL | Status: DC | PRN
  Administered 2021-09-27: 16:00:00 1 via ORAL
  Filled 2021-09-26: qty 1

## 2021-09-26 SURGICAL SUPPLY — 59 items
BAG COUNTER SPONGE SURGICOUNT (BAG) IMPLANT
BAG DECANTER FOR FLEXI CONT (MISCELLANEOUS) IMPLANT
BAG SURGICOUNT SPONGE COUNTING (BAG)
BAG ZIPLOCK 12X15 (MISCELLANEOUS) IMPLANT
CHLORAPREP W/TINT 26 (MISCELLANEOUS) ×3 IMPLANT
COVER PERINEAL POST (MISCELLANEOUS) ×3 IMPLANT
COVER SURGICAL LIGHT HANDLE (MISCELLANEOUS) ×3 IMPLANT
DECANTER SPIKE VIAL GLASS SM (MISCELLANEOUS) ×3 IMPLANT
DERMABOND ADVANCED (GAUZE/BANDAGES/DRESSINGS) ×2
DERMABOND ADVANCED .7 DNX12 (GAUZE/BANDAGES/DRESSINGS) ×2 IMPLANT
DRAPE IMP U-DRAPE 54X76 (DRAPES) ×3 IMPLANT
DRAPE SHEET LG 3/4 BI-LAMINATE (DRAPES) ×9 IMPLANT
DRAPE STERI IOBAN 125X83 (DRAPES) ×3 IMPLANT
DRAPE U-SHAPE 47X51 STRL (DRAPES) ×6 IMPLANT
DRSG AQUACEL AG ADV 3.5X10 (GAUZE/BANDAGES/DRESSINGS) ×3 IMPLANT
ELECT REM PT RETURN 15FT ADLT (MISCELLANEOUS) ×3 IMPLANT
GAUZE SPONGE 4X4 12PLY STRL (GAUZE/BANDAGES/DRESSINGS) ×3 IMPLANT
GLOVE SRG 8 PF TXTR STRL LF DI (GLOVE) ×1 IMPLANT
GLOVE SURG ENC MOIS LTX SZ8.5 (GLOVE) ×6 IMPLANT
GLOVE SURG ENC TEXT LTX SZ7.5 (GLOVE) ×6 IMPLANT
GLOVE SURG UNDER POLY LF SZ8 (GLOVE) ×3
GLOVE SURG UNDER POLY LF SZ8.5 (GLOVE) ×3 IMPLANT
GOWN SPEC L3 XXLG W/TWL (GOWN DISPOSABLE) ×3 IMPLANT
GOWN STRL REUS W/TWL XL LVL3 (GOWN DISPOSABLE) ×3 IMPLANT
HANDPIECE INTERPULSE COAX TIP (DISPOSABLE) ×3
HD FEM MOD 36M STD (Miscellaneous) ×3 IMPLANT
HEAD FEM MOD 36M STD (Miscellaneous) IMPLANT
HOLDER FOLEY CATH W/STRAP (MISCELLANEOUS) ×3 IMPLANT
HOOD PEEL AWAY FLYTE STAYCOOL (MISCELLANEOUS) ×12 IMPLANT
JET LAVAGE IRRISEPT WOUND (IRRIGATION / IRRIGATOR) ×3
KIT TURNOVER KIT A (KITS) IMPLANT
LAVAGE JET IRRISEPT WOUND (IRRIGATION / IRRIGATOR) ×1 IMPLANT
LINER ACETAB ~~LOC~~ D 36 (Liner) ×2 IMPLANT
MANIFOLD NEPTUNE II (INSTRUMENTS) ×3 IMPLANT
MARKER SKIN DUAL TIP RULER LAB (MISCELLANEOUS) ×3 IMPLANT
NDL SAFETY ECLIPSE 18X1.5 (NEEDLE) ×1 IMPLANT
NDL SPNL 18GX3.5 QUINCKE PK (NEEDLE) ×1 IMPLANT
NEEDLE HYPO 18GX1.5 SHARP (NEEDLE) ×3
NEEDLE SPNL 18GX3.5 QUINCKE PK (NEEDLE) ×3 IMPLANT
PACK ANTERIOR HIP CUSTOM (KITS) ×3 IMPLANT
PENCIL SMOKE EVACUATOR (MISCELLANEOUS) IMPLANT
SAW OSC TIP CART 19.5X105X1.3 (SAW) ×3 IMPLANT
SEALER BIPOLAR AQUA 6.0 (INSTRUMENTS) ×3 IMPLANT
SET HNDPC FAN SPRY TIP SCT (DISPOSABLE) ×1 IMPLANT
SHELL ACET G7 3H 50 SZD (Shell) ×2 IMPLANT
SPONGE T-LAP 18X18 ~~LOC~~+RFID (SPONGE) ×9 IMPLANT
STAPLER INSORB 30 2030 C-SECTI (MISCELLANEOUS) IMPLANT
STEM FEM CMTLS 13 146 (Stem) ×2 IMPLANT
SUT MNCRL AB 3-0 PS2 18 (SUTURE) ×3 IMPLANT
SUT MON AB 2-0 CT1 36 (SUTURE) ×3 IMPLANT
SUT STRATAFIX PDO 1 14 VIOLET (SUTURE) ×3
SUT STRATFX PDO 1 14 VIOLET (SUTURE) ×1
SUT VIC AB 2-0 CT1 27 (SUTURE)
SUT VIC AB 2-0 CT1 TAPERPNT 27 (SUTURE) IMPLANT
SUTURE STRATFX PDO 1 14 VIOLET (SUTURE) ×1 IMPLANT
SYR 3ML LL SCALE MARK (SYRINGE) ×3 IMPLANT
TRAY FOLEY MTR SLVR 16FR STAT (SET/KITS/TRAYS/PACK) IMPLANT
TUBE SUCTION HIGH CAP CLEAR NV (SUCTIONS) ×3 IMPLANT
WATER STERILE IRR 1000ML POUR (IV SOLUTION) ×3 IMPLANT

## 2021-09-26 NOTE — Discharge Instructions (Signed)
? ?Dr. Duanna Runk ?Joint Replacement Specialist ?[facility] ?[address] ?[place], [place] [address] ?[phone] ? ? ?TOTAL HIP REPLACEMENT POSTOPERATIVE DIRECTIONS ? ? ? ?Hip Rehabilitation, Guidelines Following Surgery  ? ?WEIGHT BEARING ?Weight bearing as tolerated with assist device (walker, cane, etc) as directed, use it as long as suggested by your surgeon or therapist, typically at least 4-6 weeks. ? ?The results of a hip operation are greatly improved after range of motion and muscle strengthening exercises. Follow all safety measures which are given to protect your hip. If any of these exercises cause increased pain or swelling in your joint, decrease the amount until you are comfortable again. Then slowly increase the exercises. Call your caregiver if you have problems or questions.  ? ?HOME CARE INSTRUCTIONS  ?Most of the following instructions are designed to prevent the dislocation of your new hip.  ?Remove items at home which could result in a fall. This includes throw rugs or furniture in walking pathways.  ?Continue medications as instructed at time of discharge. ?You may have some home medications which will be placed on hold until you complete the course of blood thinner medication. ?You may start showering once you are discharged home. Do not remove your dressing. ?Do not put on socks or shoes without following the instructions of your caregivers.   ?Sit on chairs with arms. Use the chair arms to help push yourself up when arising.  ?Arrange for the use of a toilet seat elevator so you are not sitting low.  ?Walk with walker as instructed.  ?You may resume a sexual relationship in one month or when given the OK by your caregiver.  ?Use walker as long as suggested by your caregivers.  ?You may put full weight on your legs and walk as much as is comfortable. ?Avoid periods of inactivity such as sitting longer than an hour when not asleep. This helps prevent blood  clots.  ?You may return to work once you are cleared by your surgeon.  ?Do not drive a car for 6 weeks or until released by your surgeon.  ?Do not drive while taking narcotics.  ?Wear elastic stockings for two weeks following surgery during the day but you may remove then at night.  ?Make sure you keep all of your appointments after your operation with all of your doctors and caregivers. You should call the office at the above phone number and make an appointment for approximately two weeks after the date of your surgery. ?Please pick up a stool softener and laxative for home use as long as you are requiring pain medications. ?ICE to the affected hip every three hours for 30 minutes at a time and then as needed for pain and swelling. Continue to use ice on the hip for pain and swelling from surgery. You may notice swelling that will progress down to the foot and ankle.  This is normal after surgery.  Elevate the leg when you are not up walking on it.   ?It is important for you to complete the blood thinner medication as prescribed by your doctor. ?Continue to use the breathing machine which will help keep your temperature down.  It is common for your temperature to cycle up and down following surgery, especially at night when you are not up moving around and exerting yourself.  The breathing machine keeps your lungs expanded and your temperature down. ? ?RANGE OF MOTION AND STRENGTHENING EXERCISES  ?These exercises are designed to help you   keep full movement of your hip joint. Follow your caregiver's or physical therapist's instructions. Perform all exercises about fifteen times, three times per day or as directed. Exercise both hips, even if you have had only one joint replacement. These exercises can be done on a training (exercise) mat, on the floor, on a table or on a bed. Use whatever works the best and is most comfortable for you. Use music or television while you are exercising so that the exercises are a  pleasant break in your day. This will make your life better with the exercises acting as a break in routine you can look forward to.  ?Lying on your back, slowly slide your foot toward your buttocks, raising your knee up off the floor. Then slowly slide your foot back down until your leg is straight again.  ?Lying on your back spread your legs as far apart as you can without causing discomfort.  ?Lying on your side, raise your upper leg and foot straight up from the floor as far as is comfortable. Slowly lower the leg and repeat.  ?Lying on your back, tighten up the muscle in the front of your thigh (quadriceps muscles). You can do this by keeping your leg straight and trying to raise your heel off the floor. This helps strengthen the largest muscle supporting your knee.  ?Lying on your back, tighten up the muscles of your buttocks both with the legs straight and with the knee bent at a comfortable angle while keeping your heel on the floor.  ? ?SKILLED REHAB INSTRUCTIONS: ?If the patient is transferred to a skilled rehab facility following release from the hospital, a list of the current medications will be sent to the facility for the patient to continue.  When discharged from the skilled rehab facility, please have the facility set up the patient's Home Health Physical Therapy prior to being released. Also, the skilled facility will be responsible for providing the patient with their medications at time of release from the facility to include their pain medication and their blood thinner medication. If the patient is still at the rehab facility at time of the two week follow up appointment, the skilled rehab facility will also need to assist the patient in arranging follow up appointment in our office and any transportation needs. ? ?POST-OPERATIVE OPIOID TAPER INSTRUCTIONS: ?It is important to wean off of your opioid medication as soon as possible. If you do not need pain medication after your surgery it is ok  to stop day one. ?Opioids include: ?Codeine, Hydrocodone(Norco, Vicodin), Oxycodone(Percocet, oxycontin) and hydromorphone amongst others.  ?Long term and even short term use of opiods can cause: ?Increased pain response ?Dependence ?Constipation ?Depression ?Respiratory depression ?And more.  ?Withdrawal symptoms can include ?Flu like symptoms ?Nausea, vomiting ?And more ?Techniques to manage these symptoms ?Hydrate well ?Eat regular healthy meals ?Stay active ?Use relaxation techniques(deep breathing, meditating, yoga) ?Do Not substitute Alcohol to help with tapering ?If you have been on opioids for less than two weeks and do not have pain than it is ok to stop all together.  ?Plan to wean off of opioids ?This plan should start within one week post op of your joint replacement. ?Maintain the same interval or time between taking each dose and first decrease the dose.  ?Cut the total daily intake of opioids by one tablet each day ?Next start to increase the time between doses. ?The last dose that should be eliminated is the evening dose.  ? ? ?MAKE   SURE YOU:  ?Understand these instructions.  ?Will watch your condition.  ?Will get help right away if you are not doing well or get worse. ? ?Pick up stool softner and laxative for home use following surgery while on pain medications. ?Do not remove your dressing. ?The dressing is waterproof--it is OK to take showers. ?Continue to use ice for pain and swelling after surgery. ?Do not use any lotions or creams on the incision until instructed by your surgeon. ?Total Hip Protocol. ? ?

## 2021-09-26 NOTE — Anesthesia Postprocedure Evaluation (Signed)
Anesthesia Post Note  Patient: Linda Copeland  Procedure(s) Performed: TOTAL HIP ARTHROPLASTY ANTERIOR APPROACH (Right: Hip)     Patient location during evaluation: PACU Anesthesia Type: General Level of consciousness: awake and alert Pain management: pain level controlled Vital Signs Assessment: post-procedure vital signs reviewed and stable Respiratory status: spontaneous breathing, nonlabored ventilation, respiratory function stable and patient connected to nasal cannula oxygen Cardiovascular status: blood pressure returned to baseline and stable Postop Assessment: no apparent nausea or vomiting Anesthetic complications: no   No notable events documented.  Last Vitals:  Vitals:   09/26/21 1545 09/26/21 1605  BP: 137/67 129/63  Pulse: 84 87  Resp: (!) 21 12  Temp: 36.4 C 36.8 C  SpO2: 98% 97%    Last Pain:  Vitals:   09/26/21 1605  TempSrc: Oral  PainSc:                  Effie Berkshire

## 2021-09-26 NOTE — Progress Notes (Signed)
Received patient into 1405. Vital signs taken. Cardiac  monitoring initiated. Assessment and skin check to follow. Patient answering questions but sleepy.

## 2021-09-26 NOTE — Anesthesia Procedure Notes (Signed)
Procedure Name: Intubation Date/Time: 09/26/2021 2:00 PM Performed by: Lavina Hamman, CRNA Pre-anesthesia Checklist: Patient identified, Emergency Drugs available, Suction available, Patient being monitored and Timeout performed Patient Re-evaluated:Patient Re-evaluated prior to induction Oxygen Delivery Method: Circle system utilized Preoxygenation: Pre-oxygenation with 100% oxygen Induction Type: IV induction Ventilation: Mask ventilation without difficulty Laryngoscope Size: Mac and 3 Grade View: Grade I Tube type: Oral Tube size: 7.0 mm Number of attempts: 1 Airway Equipment and Method: Stylet Placement Confirmation: ETT inserted through vocal cords under direct vision, positive ETCO2, CO2 detector and breath sounds checked- equal and bilateral Secured at: 21 cm Tube secured with: Tape Dental Injury: Teeth and Oropharynx as per pre-operative assessment  Comments: ATOI, mouth dry and cracked, suctioned with little heme.

## 2021-09-26 NOTE — Interval H&P Note (Signed)
History and Physical Interval Note:  09/26/2021 1:17 PM  Linda Copeland  has presented today for surgery, with the diagnosis of Right hip fracture.  The various methods of treatment have been discussed with the patient and family. After consideration of risks, benefits and other options for treatment, the patient has consented to  Procedure(s): TOTAL HIP ARTHROPLASTY ANTERIOR APPROACH (Right) as a surgical intervention.  The patient's history has been reviewed, patient examined, no change in status, stable for surgery.  I have reviewed the patient's chart and labs.  Questions were answered to the patient's satisfaction.    The risks, benefits, and alternatives were discussed with the patient's son, [name]. There are risks associated with the surgery including, but not limited to, problems with anesthesia (death), infection, instability (giving out of the joint), dislocation, differences in leg length/angulation/rotation, fracture of bones, loosening or failure of implants, hematoma (blood accumulation) which may require surgical drainage, blood clots, pulmonary embolism, nerve injury (foot drop and lateral thigh numbness), and blood vessel injury. The son understands these risks and elects to proceed.    Linda Copeland

## 2021-09-26 NOTE — Anesthesia Preprocedure Evaluation (Addendum)
Anesthesia Evaluation  Patient identified by MRN, date of birth, ID band Patient confused    Reviewed: Allergy & Precautions, NPO status , Patient's Chart, lab work & pertinent test results  Airway Mallampati: III  TM Distance: >3 FB Neck ROM: Full    Dental  (+) Upper Dentures   Pulmonary neg pulmonary ROS,    breath sounds clear to auscultation       Cardiovascular hypertension, Pt. on medications  Rhythm:Regular Rate:Normal     Neuro/Psych negative psych ROS   GI/Hepatic negative GI ROS, Neg liver ROS,   Endo/Other  Hypothyroidism   Renal/GU negative Renal ROS  negative genitourinary   Musculoskeletal  (+) Arthritis ,   Abdominal Normal abdominal exam  (+)   Peds  Hematology negative hematology ROS (+)   Anesthesia Other Findings   Reproductive/Obstetrics                           Anesthesia Physical Anesthesia Plan  ASA: 3  Anesthesia Plan: General   Post-op Pain Management:    Induction: Intravenous  PONV Risk Score and Plan: 4 or greater and Ondansetron and Treatment may vary due to age or medical condition  Airway Management Planned: Oral ETT  Additional Equipment: None  Intra-op Plan:   Post-operative Plan: Extubation in OR  Informed Consent: I have reviewed the patients History and Physical, chart, labs and discussed the procedure including the risks, benefits and alternatives for the proposed anesthesia with the patient or authorized representative who has indicated his/her understanding and acceptance.     Consent reviewed with POA  Plan Discussed with: CRNA  Anesthesia Plan Comments: (Discussed plan with Mr. [name] Tal, will proceed with GA. )       Anesthesia Quick Evaluation

## 2021-09-26 NOTE — Op Note (Signed)
OPERATIVE REPORT  SURGEON: Rod Can, MD   ASSISTANT: Laurine Blazer, PA-C  PREOPERATIVE DIAGNOSIS: Displaced Right femoral neck fracture.   POSTOPERATIVE DIAGNOSIS: Displaced Right femoral neck fracture.   PROCEDURE: Right total hip arthroplasty, anterior approach.   IMPLANTS: Biomet Taperloc Reduced Distal stem, size 13x146 mm, high offset. Biomet G7 OsseoTi Cup, size 50 mm. Biomet Vivacit-E liner, size 36 mm, D, neutral. Biomet metal head ball, size 36 + 0 mm.  ANESTHESIA:  General  ANTIBIOTICS: 2g ancef.  ESTIMATED BLOOD LOSS:-250 mL    DRAINS: None.  COMPLICATIONS: None   CONDITION: PACU - hemodynamically stable.   BRIEF CLINICAL NOTE: Linda Copeland is a 87 y.o. female with advanced dementia who presented with a displaced Right femoral neck fracture. The patient was admitted to the hospitalist service and underwent perioperative risk stratification and medical optimization. The risks, benefits, and alternatives to total hip arthroplasty were explained, and the patient's son elected to proceed.  PROCEDURE IN DETAIL: The patient was taken to the operating room and general anesthesia was induced on the hospital bed.  A foley catheter was inserted. The patient was then positioned on the Hana table.  All bony prominences were well padded.  The hip was prepped and draped in the normal sterile surgical fashion.  A time-out was called verifying side and site of surgery. Antibiotics were given within 60 minutes of beginning the procedure.   Bikini incision was made, and the direct anterior approach to the hip was performed through the Hueter interval.  Lateral femoral circumflex vessels were treated with the Auqumantys. The anterior capsule was exposed and an inverted T capsulotomy was made.  Fracture hematoma was encountered and evacuated. The patient was found to have a comminuted Right subcapital femoral neck fracture.  I freshened the femoral neck cut with a saw.  I removed the  femoral neck fragment.  A corkscrew was placed into the head and the head was removed.  This was passed to the back table and was measured. The pubofemoral ligament was released subperiosteally to the lesser trochanter.  Acetabular exposure was achieved, and the pulvinar and labrum were excised. Sequential reaming of the acetabulum was then performed up to a size 49 mm reamer under direct visulization. A 50 mm cup was then opened and impacted into place at approximately 40 degrees of abduction and 20 degrees of anteversion. The final polyethylene liner was impacted into place and acetabular osteophytes were removed.    I then gained femoral exposure taking care to protect the abductors and greater trochanter.  This was performed using standard external rotation, extension, and adduction.  A cookie cutter was used to enter the femoral canal, and then the femoral canal finder was placed.  Sequential broaching was performed up to a size 13.  Calcar planer was used on the femoral neck remnant.  I placed a high offset neck and a trial head ball.  The hip was reduced.  Leg lengths and offset were checked fluoroscopically.  The hip was dislocated and trial components were removed.  The final implants were placed, and the hip was reduced.  Fluoroscopy was used to confirm component position and leg lengths.  At 90 degrees of external rotation and full extension, the hip was stable to an anterior directed force.   The wound was copiously irrigated with Irrisept solution and normal saline using pule lavage.  Marcaine solution was injected into the periarticular soft tissue.  The wound was closed in layers using #1 Stratafix for the fascia,  2-0 Vicryl for the subcutaneous fat, 2-0 Monocryl for the deep dermal layer, and staples + Dermabond for the skin.  Once the glue was fully dried, an Aquacell Ag dressing was applied.  The patient was transported to the recovery room in stable condition.  Sponge, needle, and instrument  counts were correct at the end of the case x2.  The patient tolerated the procedure well and there were no known complications.  Please note that a surgical assistant was a medical necessity for this procedure to perform it in a safe and expeditious manner. Assistant was necessary to provide appropriate retraction of vital neurovascular structures, to prevent femoral fracture, and to allow for anatomic placement of the prosthesis.

## 2021-09-26 NOTE — H&P (View-Only) (Signed)
Reason for Consult: right hip fracture  Linda Copeland is an 87 y.o. female.  HPI: This is a pleasant 87 year old female who was brought to the ER for right hip pain after fall.  Her son provided most of the history for the emergency room.  She fell and had immediate pain in the right thigh and hip area.  Is described as a sharp severe pain.  She was ambulatory up until this fall. Was seen at [facility] ED and was transferred to [facility] for surgical management.   Past Medical History:  Diagnosis Date   Arthritis    bilateral severe facet L4-L5, L5-S1   Carpal tunnel syndrome of right wrist    Chronic cervical radiculopathy    C5   Compression fracture of lumbar vertebra (HCC)    L2-L3   Environmental and seasonal allergies    Frequent urination    Hematoma of frontal scalp 10/06/2016   History of bronchitis    History of humerus fracture 10/06/2016   Right proximal   History of subdural hematoma 10/06/2016   Left falcotentorial    HOH (hard of hearing)    Hypertension    Hypothyroidism    Nasal fracture 10/06/2016   OA (osteoarthritis)    AC glenohumeral joinr   Osteopenia    Spinal stenosis at L4-L5 level     Past Surgical History:  Procedure Laterality Date   CARPAL TUNNEL RELEASE Right 01/22/2018   Procedure: RIGHT CARPAL TUNNEL RELEASE;  Surgeon: Daryll Brod, MD;  Location: [place];  Service: Orthopedics;  Laterality: Right;   COLONOSCOPY      Family History  Problem Relation Age of Onset   Diabetes Son     Social History:  reports that she has never smoked. She has never used smokeless tobacco. She reports that she does not currently use alcohol. She reports that she does not use drugs.  Allergies:  Allergies  Allergen Reactions   Codeine Nausea And Vomiting and Other (See Comments)    uknown   Biaxin [Clarithromycin] Other (See Comments)    Sweats   Caffeine     Shaky    Medications: I have reviewed the patient's current  medications.  Results for orders placed or performed during the hospital encounter of 09/25/21 (from the past 48 hour(s))  Resp Panel by RT-PCR (Flu A&B, Covid) Nasopharyngeal Swab     Status: None   Collection Time: 09/25/21  2:07 AM   Specimen: Nasopharyngeal Swab; Nasopharyngeal(NP) swabs in vial transport medium  Result Value Ref Range   SARS Coronavirus 2 by RT PCR NEGATIVE NEGATIVE    Comment: (NOTE) SARS-CoV-2 target nucleic acids are NOT DETECTED.  The SARS-CoV-2 RNA is generally detectable in upper respiratory specimens during the acute phase of infection. The lowest concentration of SARS-CoV-2 viral copies this assay can detect is 138 copies/mL. A negative result does not preclude SARS-Cov-2 infection and should not be used as the sole basis for treatment or other patient management decisions. A negative result may occur with  improper specimen collection/handling, submission of specimen other than nasopharyngeal swab, presence of viral mutation(s) within the areas targeted by this assay, and inadequate number of viral copies(<138 copies/mL). A negative result must be combined with clinical observations, patient history, and epidemiological information. The expected result is Negative.  Fact Sheet for Patients:  EntrepreneurPulse.com.au  Fact Sheet for Healthcare Providers:  IncredibleEmployment.be  This test is no t yet approved or cleared by the [place] FDA and  has  been authorized for detection and/or diagnosis of SARS-CoV-2 by FDA under an Emergency Use Authorization (EUA). This EUA will remain  in effect (meaning this test can be used) for the duration of the COVID-19 declaration under Section 564(b)(1) of the Act, 21 U.S.C.section 360bbb-3(b)(1), unless the authorization is terminated  or revoked sooner.       Influenza A by PCR NEGATIVE NEGATIVE   Influenza B by PCR NEGATIVE NEGATIVE    Comment: (NOTE) The Xpert  Xpress SARS-CoV-2/FLU/RSV plus assay is intended as an aid in the diagnosis of influenza from Nasopharyngeal swab specimens and should not be used as a sole basis for treatment. Nasal washings and aspirates are unacceptable for Xpert Xpress SARS-CoV-2/FLU/RSV testing.  Fact Sheet for Patients: EntrepreneurPulse.com.au  Fact Sheet for Healthcare Providers: IncredibleEmployment.be  This test is not yet approved or cleared by the [place] FDA and has been authorized for detection and/or diagnosis of SARS-CoV-2 by FDA under an Emergency Use Authorization (EUA). This EUA will remain in effect (meaning this test can be used) for the duration of the COVID-19 declaration under Section 564(b)(1) of the Act, 21 U.S.C. section 360bbb-3(b)(1), unless the authorization is terminated or revoked.  Performed at [facility], [place] [address] [place], [place] [address]   Comprehensive metabolic panel     Status: Abnormal   Collection Time: 09/25/21  2:25 AM  Result Value Ref Range   Sodium 133 (L) 135 - 145 mmol/L   Potassium 3.8 3.5 - 5.1 mmol/L   Chloride 102 98 - 111 mmol/L   CO2 21 (L) 22 - 32 mmol/L   Glucose, Bld 133 (H) 70 - 99 mg/dL    Comment: Glucose reference range applies only to samples taken after fasting for at least 8 hours.   BUN 11 8 - 23 mg/dL   Creatinine, Ser 0.52 0.44 - 1.00 mg/dL   Calcium 9.0 8.9 - 10.3 mg/dL   Total Protein 7.3 6.5 - 8.1 g/dL   Albumin 3.7 3.5 - 5.0 g/dL   AST 22 15 - 41 U/L   ALT 21 0 - 44 U/L   Alkaline Phosphatase 67 38 - 126 U/L   Total Bilirubin 0.6 0.3 - 1.2 mg/dL   GFR, Estimated >60 >60 mL/min    Comment: (NOTE) Calculated using the CKD-EPI Creatinine Equation (2021)    Anion gap 10 5 - 15    Comment: Performed at [facility] [address] [place], [place] [address]  CBC with Differential     Status: Abnormal   Collection Time: 09/25/21  2:25 AM  Result Value Ref Range   WBC 10.3  4.0 - 10.5 K/uL   RBC 4.68 3.87 - 5.11 MIL/uL   Hemoglobin 13.6 12.0 - 15.0 g/dL   HCT 42.1 36.0 - 46.0 %   MCV 90.0 80.0 - 100.0 fL   MCH 29.1 26.0 - 34.0 pg   MCHC 32.3 30.0 - 36.0 g/dL   RDW 13.1 11.5 - 15.5 %   Platelets 207 150 - 400 K/uL   nRBC 0.0 0.0 - 0.2 %   Neutrophils Relative % 84 %   Neutro Abs 8.6 (H) 1.7 - 7.7 K/uL   Lymphocytes Relative 8 %   Lymphs Abs 0.9 0.7 - 4.0 K/uL   Monocytes Relative 7 %   Monocytes Absolute 0.7 0.1 - 1.0 K/uL   Eosinophils Relative 0 %   Eosinophils Absolute 0.0 0.0 - 0.5 K/uL   Basophils Relative 0 %   Basophils  Absolute 0.0 0.0 - 0.1 K/uL   Immature Granulocytes 1 %   Abs Immature Granulocytes 0.08 (H) 0.00 - 0.07 K/uL    Comment: Performed at [facility], [place] [address] [place], [place] [address]  Urinalysis, Routine w reflex microscopic Urine, Clean Catch     Status: Abnormal   Collection Time: 09/25/21  3:30 AM  Result Value Ref Range   Color, Urine YELLOW YELLOW   APPearance CLOUDY (A) CLEAR   Specific Gravity, Urine 1.025 1.005 - 1.030   pH 7.0 5.0 - 8.0   Glucose, UA NEGATIVE NEGATIVE mg/dL   Hgb urine dipstick TRACE (A) NEGATIVE   Bilirubin Urine NEGATIVE NEGATIVE   Ketones, ur NEGATIVE NEGATIVE mg/dL   Protein, ur NEGATIVE NEGATIVE mg/dL   Nitrite NEGATIVE NEGATIVE   Leukocytes,Ua TRACE (A) NEGATIVE    Comment: Performed at [facility] [address], [place], [place] [address]  Urinalysis, Microscopic (reflex)     Status: Abnormal   Collection Time: 09/25/21  3:30 AM  Result Value Ref Range   RBC / HPF 0-5 0 - 5 RBC/hpf   WBC, UA 0-5 0 - 5 WBC/hpf   Bacteria, UA FEW (A) NONE SEEN   Squamous Epithelial / LPF 0-5 0 - 5    Comment: Performed at [facility], [place] [address] [place], [place] [address]  TSH     Status: None   Collection Time: 09/26/21  2:29 AM  Result Value Ref Range   TSH 2.046 0.350 - 4.500 uIU/mL    Comment: Performed by a 3rd Generation assay with a functional sensitivity of  <=0.01 uIU/mL. Performed at [facility], [place] [address], [place], [place] [address]   Basic metabolic panel     Status: Abnormal   Collection Time: 09/26/21  2:29 AM  Result Value Ref Range   Sodium 133 (L) 135 - 145 mmol/L   Potassium 3.6 3.5 - 5.1 mmol/L   Chloride 101 98 - 111 mmol/L   CO2 22 22 - 32 mmol/L   Glucose, Bld 104 (H) 70 - 99 mg/dL    Comment: Glucose reference range applies only to samples taken after fasting for at least 8 hours.   BUN 9 8 - 23 mg/dL   Creatinine, Ser 0.43 (L) 0.44 - 1.00 mg/dL   Calcium 8.7 (L) 8.9 - 10.3 mg/dL   GFR, Estimated >60 >60 mL/min    Comment: (NOTE) Calculated using the CKD-EPI Creatinine Equation (2021)    Anion gap 10 5 - 15    Comment: Performed at [facility] [address] [place] [address]  CBC     Status: Abnormal   Collection Time: 09/26/21  2:29 AM  Result Value Ref Range   WBC 11.0 (H) 4.0 - 10.5 K/uL   RBC 4.35 3.87 - 5.11 MIL/uL   Hemoglobin 13.2 12.0 - 15.0 g/dL   HCT 39.4 36.0 - 46.0 %   MCV 90.6 80.0 - 100.0 fL   MCH 30.3 26.0 - 34.0 pg   MCHC 33.5 30.0 - 36.0 g/dL   RDW 13.0 11.5 - 15.5 %   Platelets 184 150 - 400 K/uL   nRBC 0.0 0.0 - 0.2 %    Comment: Performed at [facility], [place] [address] [place], [place] [address]  Surgical pcr screen     Status: None   Collection Time: 09/26/21  3:01 AM   Specimen: Nasal Mucosa; Nasal Swab  Result  Value Ref Range   MRSA, PCR NEGATIVE NEGATIVE   Staphylococcus aureus NEGATIVE NEGATIVE    Comment: (NOTE) The Xpert SA Assay (FDA approved for NASAL specimens in patients 11 years of age and older), is one component of a comprehensive surveillance program. It is not intended to diagnose infection nor to guide or monitor treatment. Performed at [facility], [place] [address], [place], [place] [address]   Type and screen [place]     Status: None   Collection Time: 09/26/21 11:59 AM  Result Value Ref Range    ABO/RH(D) O POS    Antibody Screen NEG    Sample Expiration      09/29/2021,2359 Performed at [facility] [address] [place], [place] [address]     DG Chest 1 View  Result Date: 09/25/2021 CLINICAL DATA:  Fall and confusion EXAM: CHEST  1 VIEW COMPARISON:  07/09/2019 FINDINGS: The heart size and mediastinal contours are within normal limits. Both lungs are clear. The visualized skeletal structures are unremarkable. IMPRESSION: No active disease. Electronically Signed   By: Ulyses Jarred M.D.   On: 09/25/2021 03:03   CT Head Wo Contrast  Result Date: 09/25/2021 CLINICAL DATA:  Recent fall several hours ago with headaches and neck pain, initial encounter EXAM: CT HEAD WITHOUT CONTRAST CT CERVICAL SPINE WITHOUT CONTRAST TECHNIQUE: Multidetector CT imaging of the head and cervical spine was performed following the standard protocol without intravenous contrast. Multiplanar CT image reconstructions of the cervical spine were also generated. COMPARISON:  None. FINDINGS: CT HEAD FINDINGS Brain: No evidence of acute infarction, hemorrhage, hydrocephalus, extra-axial collection or mass lesion/mass effect. Chronic atrophic and ischemic changes are noted. Vascular: No hyperdense vessel or unexpected calcification. Skull: Normal. Negative for fracture or focal lesion. Sinuses/Orbits: No acute finding. Other: None. CT CERVICAL SPINE FINDINGS Alignment: Mild degenerative anterolisthesis of C7 on T1 is noted. Skull base and vertebrae: 7 cervical segments are well visualized. Vertebral body height is well maintained. Irregularity along the superior aspect of the C6 vertebral body is noted with associated osteophytic changes consistent with degenerative change. No compression deformity is noted. Facet hypertrophic changes are noted. No acute fracture or acute facet abnormality is noted. Soft tissues and spinal canal: Surrounding soft tissue structures are within normal limits. Upper chest:  Visualized lung apices demonstrate a small 3-4 mm nodule in the right upper lobe best seen on image number 72 of series 6. Other: None IMPRESSION: CT of the head: Chronic atrophic and ischemic changes. CT of cervical spine: Multilevel degenerative change. 3-4 mm nodule in the right upper lobe. No follow-up needed if patient is low-risk. Non-contrast chest CT can be considered in 12 months if patient is high-risk. This recommendation follows the consensus statement: Guidelines for Management of Incidental Pulmonary Nodules Detected on CT Images: From the Fleischner Society 2017; Radiology 2017; 284:228-243. Electronically Signed   By: Inez Catalina M.D.   On: 09/25/2021 03:19   CT Cervical Spine Wo Contrast  Result Date: 09/25/2021 CLINICAL DATA:  Recent fall several hours ago with headaches and neck pain, initial encounter EXAM: CT HEAD WITHOUT CONTRAST CT CERVICAL SPINE WITHOUT CONTRAST TECHNIQUE: Multidetector CT imaging of the head and cervical spine was performed following the standard protocol without intravenous contrast. Multiplanar CT image reconstructions of the cervical spine were also generated. COMPARISON:  None. FINDINGS: CT HEAD FINDINGS Brain: No evidence of acute infarction, hemorrhage, hydrocephalus, extra-axial collection or mass lesion/mass effect. Chronic atrophic and ischemic changes are  noted. Vascular: No hyperdense vessel or unexpected calcification. Skull: Normal. Negative for fracture or focal lesion. Sinuses/Orbits: No acute finding. Other: None. CT CERVICAL SPINE FINDINGS Alignment: Mild degenerative anterolisthesis of C7 on T1 is noted. Skull base and vertebrae: 7 cervical segments are well visualized. Vertebral body height is well maintained. Irregularity along the superior aspect of the C6 vertebral body is noted with associated osteophytic changes consistent with degenerative change. No compression deformity is noted. Facet hypertrophic changes are noted. No acute fracture or  acute facet abnormality is noted. Soft tissues and spinal canal: Surrounding soft tissue structures are within normal limits. Upper chest: Visualized lung apices demonstrate a small 3-4 mm nodule in the right upper lobe best seen on image number 72 of series 6. Other: None IMPRESSION: CT of the head: Chronic atrophic and ischemic changes. CT of cervical spine: Multilevel degenerative change. 3-4 mm nodule in the right upper lobe. No follow-up needed if patient is low-risk. Non-contrast chest CT can be considered in 12 months if patient is high-risk. This recommendation follows the consensus statement: Guidelines for Management of Incidental Pulmonary Nodules Detected on CT Images: From the Fleischner Society 2017; Radiology 2017; 284:228-243. Electronically Signed   By: Inez Catalina M.D.   On: 09/25/2021 03:19   DG Knee Right Port  Result Date: 09/25/2021 CLINICAL DATA:  Recent fall with known right hip fracture, initial encounter EXAM: PORTABLE RIGHT KNEE - 2 VIEW COMPARISON:  None. FINDINGS: No acute fracture or dislocation is noted. Mild degenerative changes are noted. No joint effusion is seen. IMPRESSION: Mild degenerative change without acute abnormality. Electronically Signed   By: Inez Catalina M.D.   On: 09/25/2021 22:10   DG Hip Unilat W or Wo Pelvis 2-3 Views Right  Result Date: 09/25/2021 CLINICAL DATA:  Fall and right hip pain. EXAM: DG HIP (WITH OR WITHOUT PELVIS) 2-3V RIGHT COMPARISON:  Right hip radiograph dated 01/11/2021. FINDINGS: There is a fracture of the right femoral neck with mild proximal migration and impaction of the femoral shaft. No dislocation. The bones are osteopenic. The soft tissues are unremarkable. Vascular calcifications noted. IMPRESSION: Mildly displaced and impacted right femoral neck fracture. Electronically Signed   By: Anner Crete M.D.   On: 09/25/2021 02:55    Review of Systems  Psychiatric/Behavioral:  Positive for agitation and confusion.   Blood  pressure 123/70, pulse 89, temperature 98.9 F (37.2 C), temperature source Oral, resp. rate 18, height 5\' 1"  (1.549 m), weight 65 kg, SpO2 97 %. Physical Exam Musculoskeletal:     Comments: Obvious shortening and rotation to right hip  NVI in right lower extremity    Assessment/Plan: Right hip fracture: Patient's son gave consent for right hip total arthroplasty. Risks and benefits explained to patient's son. They have elected to proceed with surgical management at this time.   Cordelia Pen Jeimy Bickert 09/26/2021, 1:08 PM

## 2021-09-26 NOTE — Progress Notes (Addendum)
Orthopaedics Daily Progress Note   09/26/2021   8:41 AM  Linda Copeland is a 87 y.o. female Day of Surgery s/p TOTAL HIP ARTHROPLASTY ANTERIOR APPROACH  Subjective Appears comfortable.  Disoriented to apparent baseline mental status.  Being prepared for transfer.  Objective Vitals:   09/26/21 0434 09/26/21 0435  BP: (!) 129/46 (!) 129/46  Pulse: 78 79  Resp:    Temp: 98.9 F (37.2 C) 98.9 F (37.2 C)  SpO2: 96% 96%   No intake or output data in the 24 hours ending 09/26/21 0841  Physical Exam RLE: Unable to comply with motor exam due to mental status Unable to comply with sensory exam due to mental status WWP distally  Assessment 87 y.o. female with R displaced femoral neck fracture  Plan Mobility: Bed rest pending OR Pain control: Continue to wean/titrate to appropriate oral regimen DVT Prophylaxis: [place] heparin per medical team Further surgical plans: OR today RUE: No restrictions LUE: No restrictions RLE: NWB LLE: No restrictions Disposition: Transferring to [facility] for OR today   Georgeanna Harrison M.D. Orthopaedic Surgery [facility]

## 2021-09-26 NOTE — Plan of Care (Signed)
°  Problem: Health Behavior/Discharge Planning: Goal: Ability to manage health-related needs will improve Outcome: Progressing   Problem: Clinical Measurements: Goal: Ability to maintain clinical measurements within normal limits will improve Outcome: Progressing Goal: Will remain free from infection Outcome: Progressing Goal: Diagnostic test results will improve Outcome: Progressing Goal: Respiratory complications will improve Outcome: Progressing Goal: Cardiovascular complication will be avoided Outcome: Progressing   Problem: Clinical Measurements: Goal: Ability to maintain clinical measurements within normal limits will improve Outcome: Progressing Goal: Will remain free from infection Outcome: Progressing Goal: Diagnostic test results will improve Outcome: Progressing Goal: Respiratory complications will improve Outcome: Progressing Goal: Cardiovascular complication will be avoided Outcome: Progressing   Problem: Clinical Measurements: Goal: Ability to maintain clinical measurements within normal limits will improve Outcome: Progressing Goal: Will remain free from infection Outcome: Progressing Goal: Diagnostic test results will improve Outcome: Progressing Goal: Respiratory complications will improve Outcome: Progressing Goal: Cardiovascular complication will be avoided Outcome: Progressing   Problem: Nutrition: Goal: Adequate nutrition will be maintained Outcome: Progressing   Problem: Activity: Goal: Risk for activity intolerance will decrease Outcome: Progressing   Problem: Coping: Goal: Level of anxiety will decrease Outcome: Progressing   Problem: Elimination: Goal: Will not experience complications related to bowel motility Outcome: Progressing Goal: Will not experience complications related to urinary retention Outcome: Progressing   Problem: Pain Managment: Goal: General experience of comfort will improve Outcome: Progressing   Problem:  Safety: Goal: Ability to remain free from injury will improve Outcome: Progressing   Problem: Skin Integrity: Goal: Risk for impaired skin integrity will decrease Outcome: Progressing

## 2021-09-26 NOTE — Consult Note (Signed)
Reason for Consult: right hip fracture  Linda Copeland is an 87 y.o. female.  HPI: This is a pleasant 87 year old female who was brought to the ER for right hip pain after fall.  Her son provided most of the history for the emergency room.  She fell and had immediate pain in the right thigh and hip area.  Is described as a sharp severe pain.  She was ambulatory up until this fall. Was seen at [facility] ED and was transferred to [facility] for surgical management.   Past Medical History:  Diagnosis Date   Arthritis    bilateral severe facet L4-L5, L5-S1   Carpal tunnel syndrome of right wrist    Chronic cervical radiculopathy    C5   Compression fracture of lumbar vertebra (HCC)    L2-L3   Environmental and seasonal allergies    Frequent urination    Hematoma of frontal scalp 10/06/2016   History of bronchitis    History of humerus fracture 10/06/2016   Right proximal   History of subdural hematoma 10/06/2016   Left falcotentorial    HOH (hard of hearing)    Hypertension    Hypothyroidism    Nasal fracture 10/06/2016   OA (osteoarthritis)    AC glenohumeral joinr   Osteopenia    Spinal stenosis at L4-L5 level     Past Surgical History:  Procedure Laterality Date   CARPAL TUNNEL RELEASE Right 01/22/2018   Procedure: RIGHT CARPAL TUNNEL RELEASE;  Surgeon: Daryll Brod, MD;  Location: [facility];  Service: Orthopedics;  Laterality: Right;   COLONOSCOPY      Family History  Problem Relation Age of Onset   Diabetes Son     Social History:  reports that she has never smoked. She has never used smokeless tobacco. She reports that she does not currently use alcohol. She reports that she does not use drugs.  Allergies:  Allergies  Allergen Reactions   Codeine Nausea And Vomiting and Other (See Comments)    uknown   Biaxin [Clarithromycin] Other (See Comments)    Sweats   Caffeine     Shaky    Medications: I have reviewed the patient's current  medications.  Results for orders placed or performed during the hospital encounter of 09/25/21 (from the past 48 hour(s))  Resp Panel by RT-PCR (Flu A&B, Covid) Nasopharyngeal Swab     Status: None   Collection Time: 09/25/21  2:07 AM   Specimen: Nasopharyngeal Swab; Nasopharyngeal(NP) swabs in vial transport medium  Result Value Ref Range   SARS Coronavirus 2 by RT PCR NEGATIVE NEGATIVE    Comment: (NOTE) SARS-CoV-2 target nucleic acids are NOT DETECTED.  The SARS-CoV-2 RNA is generally detectable in upper respiratory specimens during the acute phase of infection. The lowest concentration of SARS-CoV-2 viral copies this assay can detect is 138 copies/mL. A negative result does not preclude SARS-Cov-2 infection and should not be used as the sole basis for treatment or other patient management decisions. A negative result may occur with  improper specimen collection/handling, submission of specimen other than nasopharyngeal swab, presence of viral mutation(s) within the areas targeted by this assay, and inadequate number of viral copies(<138 copies/mL). A negative result must be combined with clinical observations, patient history, and epidemiological information. The expected result is Negative.  Fact Sheet for Patients:  EntrepreneurPulse.com.au  Fact Sheet for Healthcare Providers:  IncredibleEmployment.be  This test is no t yet approved or cleared by the [place] FDA and  has  been authorized for detection and/or diagnosis of SARS-CoV-2 by FDA under an Emergency Use Authorization (EUA). This EUA will remain  in effect (meaning this test can be used) for the duration of the COVID-19 declaration under Section 564(b)(1) of the Act, 21 U.S.C.section 360bbb-3(b)(1), unless the authorization is terminated  or revoked sooner.       Influenza A by PCR NEGATIVE NEGATIVE   Influenza B by PCR NEGATIVE NEGATIVE    Comment: (NOTE) The Xpert  Xpress SARS-CoV-2/FLU/RSV plus assay is intended as an aid in the diagnosis of influenza from Nasopharyngeal swab specimens and should not be used as a sole basis for treatment. Nasal washings and aspirates are unacceptable for Xpert Xpress SARS-CoV-2/FLU/RSV testing.  Fact Sheet for Patients: EntrepreneurPulse.com.au  Fact Sheet for Healthcare Providers: IncredibleEmployment.be  This test is not yet approved or cleared by the [place] FDA and has been authorized for detection and/or diagnosis of SARS-CoV-2 by FDA under an Emergency Use Authorization (EUA). This EUA will remain in effect (meaning this test can be used) for the duration of the COVID-19 declaration under Section 564(b)(1) of the Act, 21 U.S.C. section 360bbb-3(b)(1), unless the authorization is terminated or revoked.  Performed at [facility], [place] [address] [place], [place] [address]   Comprehensive metabolic panel     Status: Abnormal   Collection Time: 09/25/21  2:25 AM  Result Value Ref Range   Sodium 133 (L) 135 - 145 mmol/L   Potassium 3.8 3.5 - 5.1 mmol/L   Chloride 102 98 - 111 mmol/L   CO2 21 (L) 22 - 32 mmol/L   Glucose, Bld 133 (H) 70 - 99 mg/dL    Comment: Glucose reference range applies only to samples taken after fasting for at least 8 hours.   BUN 11 8 - 23 mg/dL   Creatinine, Ser 0.52 0.44 - 1.00 mg/dL   Calcium 9.0 8.9 - 10.3 mg/dL   Total Protein 7.3 6.5 - 8.1 g/dL   Albumin 3.7 3.5 - 5.0 g/dL   AST 22 15 - 41 U/L   ALT 21 0 - 44 U/L   Alkaline Phosphatase 67 38 - 126 U/L   Total Bilirubin 0.6 0.3 - 1.2 mg/dL   GFR, Estimated >60 >60 mL/min    Comment: (NOTE) Calculated using the CKD-EPI Creatinine Equation (2021)    Anion gap 10 5 - 15    Comment: Performed at [facility] [address], [place], [place] [address]  CBC with Differential     Status: Abnormal   Collection Time: 09/25/21  2:25 AM  Result Value Ref Range   WBC 10.3  4.0 - 10.5 K/uL   RBC 4.68 3.87 - 5.11 MIL/uL   Hemoglobin 13.6 12.0 - 15.0 g/dL   HCT 42.1 36.0 - 46.0 %   MCV 90.0 80.0 - 100.0 fL   MCH 29.1 26.0 - 34.0 pg   MCHC 32.3 30.0 - 36.0 g/dL   RDW 13.1 11.5 - 15.5 %   Platelets 207 150 - 400 K/uL   nRBC 0.0 0.0 - 0.2 %   Neutrophils Relative % 84 %   Neutro Abs 8.6 (H) 1.7 - 7.7 K/uL   Lymphocytes Relative 8 %   Lymphs Abs 0.9 0.7 - 4.0 K/uL   Monocytes Relative 7 %   Monocytes Absolute 0.7 0.1 - 1.0 K/uL   Eosinophils Relative 0 %   Eosinophils Absolute 0.0 0.0 - 0.5 K/uL   Basophils Relative 0 %   Basophils  Absolute 0.0 0.0 - 0.1 K/uL   Immature Granulocytes 1 %   Abs Immature Granulocytes 0.08 (H) 0.00 - 0.07 K/uL    Comment: Performed at [facility], [place] [address], [place], [place] [address]  Urinalysis, Routine w reflex microscopic Urine, Clean Catch     Status: Abnormal   Collection Time: 09/25/21  3:30 AM  Result Value Ref Range   Color, Urine YELLOW YELLOW   APPearance CLOUDY (A) CLEAR   Specific Gravity, Urine 1.025 1.005 - 1.030   pH 7.0 5.0 - 8.0   Glucose, UA NEGATIVE NEGATIVE mg/dL   Hgb urine dipstick TRACE (A) NEGATIVE   Bilirubin Urine NEGATIVE NEGATIVE   Ketones, ur NEGATIVE NEGATIVE mg/dL   Protein, ur NEGATIVE NEGATIVE mg/dL   Nitrite NEGATIVE NEGATIVE   Leukocytes,Ua TRACE (A) NEGATIVE    Comment: Performed at [facility] [address], [place], [place] [address]  Urinalysis, Microscopic (reflex)     Status: Abnormal   Collection Time: 09/25/21  3:30 AM  Result Value Ref Range   RBC / HPF 0-5 0 - 5 RBC/hpf   WBC, UA 0-5 0 - 5 WBC/hpf   Bacteria, UA FEW (A) NONE SEEN   Squamous Epithelial / LPF 0-5 0 - 5    Comment: Performed at [facility], [place] [address], [place], [place] [address]  TSH     Status: None   Collection Time: 09/26/21  2:29 AM  Result Value Ref Range   TSH 2.046 0.350 - 4.500 uIU/mL    Comment: Performed by a 3rd Generation assay with a functional sensitivity of  <=0.01 uIU/mL. Performed at [facility], [place] [address], [place], [place] [address]   Basic metabolic panel     Status: Abnormal   Collection Time: 09/26/21  2:29 AM  Result Value Ref Range   Sodium 133 (L) 135 - 145 mmol/L   Potassium 3.6 3.5 - 5.1 mmol/L   Chloride 101 98 - 111 mmol/L   CO2 22 22 - 32 mmol/L   Glucose, Bld 104 (H) 70 - 99 mg/dL    Comment: Glucose reference range applies only to samples taken after fasting for at least 8 hours.   BUN 9 8 - 23 mg/dL   Creatinine, Ser 0.43 (L) 0.44 - 1.00 mg/dL   Calcium 8.7 (L) 8.9 - 10.3 mg/dL   GFR, Estimated >60 >60 mL/min    Comment: (NOTE) Calculated using the CKD-EPI Creatinine Equation (2021)    Anion gap 10 5 - 15    Comment: Performed at [facility] [address] [place] [address]  CBC     Status: Abnormal   Collection Time: 09/26/21  2:29 AM  Result Value Ref Range   WBC 11.0 (H) 4.0 - 10.5 K/uL   RBC 4.35 3.87 - 5.11 MIL/uL   Hemoglobin 13.2 12.0 - 15.0 g/dL   HCT 39.4 36.0 - 46.0 %   MCV 90.6 80.0 - 100.0 fL   MCH 30.3 26.0 - 34.0 pg   MCHC 33.5 30.0 - 36.0 g/dL   RDW 13.0 11.5 - 15.5 %   Platelets 184 150 - 400 K/uL   nRBC 0.0 0.0 - 0.2 %    Comment: Performed at [facility], [place] [address], [place], [place] [address]  Surgical pcr screen     Status: None   Collection Time: 09/26/21  3:01 AM   Specimen: Nasal Mucosa; Nasal Swab  Result  Value Ref Range   MRSA, PCR NEGATIVE NEGATIVE   Staphylococcus aureus NEGATIVE NEGATIVE    Comment: (NOTE) The Xpert SA Assay (FDA approved for NASAL specimens in patients 61 years of age and older), is one component of a comprehensive surveillance program. It is not intended to diagnose infection nor to guide or monitor treatment. Performed at [facility], [place] [address], [place], [place] [address]   Type and screen [place]     Status: None   Collection Time: 09/26/21 11:59 AM  Result Value Ref Range    ABO/RH(D) O POS    Antibody Screen NEG    Sample Expiration      09/29/2021,2359 Performed at [facility] [address] [place], [place] [address]     DG Chest 1 View  Result Date: 09/25/2021 CLINICAL DATA:  Fall and confusion EXAM: CHEST  1 VIEW COMPARISON:  07/09/2019 FINDINGS: The heart size and mediastinal contours are within normal limits. Both lungs are clear. The visualized skeletal structures are unremarkable. IMPRESSION: No active disease. Electronically Signed   By: Ulyses Jarred M.D.   On: 09/25/2021 03:03   CT Head Wo Contrast  Result Date: 09/25/2021 CLINICAL DATA:  Recent fall several hours ago with headaches and neck pain, initial encounter EXAM: CT HEAD WITHOUT CONTRAST CT CERVICAL SPINE WITHOUT CONTRAST TECHNIQUE: Multidetector CT imaging of the head and cervical spine was performed following the standard protocol without intravenous contrast. Multiplanar CT image reconstructions of the cervical spine were also generated. COMPARISON:  None. FINDINGS: CT HEAD FINDINGS Brain: No evidence of acute infarction, hemorrhage, hydrocephalus, extra-axial collection or mass lesion/mass effect. Chronic atrophic and ischemic changes are noted. Vascular: No hyperdense vessel or unexpected calcification. Skull: Normal. Negative for fracture or focal lesion. Sinuses/Orbits: No acute finding. Other: None. CT CERVICAL SPINE FINDINGS Alignment: Mild degenerative anterolisthesis of C7 on T1 is noted. Skull base and vertebrae: 7 cervical segments are well visualized. Vertebral body height is well maintained. Irregularity along the superior aspect of the C6 vertebral body is noted with associated osteophytic changes consistent with degenerative change. No compression deformity is noted. Facet hypertrophic changes are noted. No acute fracture or acute facet abnormality is noted. Soft tissues and spinal canal: Surrounding soft tissue structures are within normal limits. Upper chest:  Visualized lung apices demonstrate a small 3-4 mm nodule in the right upper lobe best seen on image number 72 of series 6. Other: None IMPRESSION: CT of the head: Chronic atrophic and ischemic changes. CT of cervical spine: Multilevel degenerative change. 3-4 mm nodule in the right upper lobe. No follow-up needed if patient is low-risk. Non-contrast chest CT can be considered in 12 months if patient is high-risk. This recommendation follows the consensus statement: Guidelines for Management of Incidental Pulmonary Nodules Detected on CT Images: From the Fleischner Society 2017; Radiology 2017; 284:228-243. Electronically Signed   By: Inez Catalina M.D.   On: 09/25/2021 03:19   CT Cervical Spine Wo Contrast  Result Date: 09/25/2021 CLINICAL DATA:  Recent fall several hours ago with headaches and neck pain, initial encounter EXAM: CT HEAD WITHOUT CONTRAST CT CERVICAL SPINE WITHOUT CONTRAST TECHNIQUE: Multidetector CT imaging of the head and cervical spine was performed following the standard protocol without intravenous contrast. Multiplanar CT image reconstructions of the cervical spine were also generated. COMPARISON:  None. FINDINGS: CT HEAD FINDINGS Brain: No evidence of acute infarction, hemorrhage, hydrocephalus, extra-axial collection or mass lesion/mass effect. Chronic atrophic and ischemic changes are  noted. Vascular: No hyperdense vessel or unexpected calcification. Skull: Normal. Negative for fracture or focal lesion. Sinuses/Orbits: No acute finding. Other: None. CT CERVICAL SPINE FINDINGS Alignment: Mild degenerative anterolisthesis of C7 on T1 is noted. Skull base and vertebrae: 7 cervical segments are well visualized. Vertebral body height is well maintained. Irregularity along the superior aspect of the C6 vertebral body is noted with associated osteophytic changes consistent with degenerative change. No compression deformity is noted. Facet hypertrophic changes are noted. No acute fracture or  acute facet abnormality is noted. Soft tissues and spinal canal: Surrounding soft tissue structures are within normal limits. Upper chest: Visualized lung apices demonstrate a small 3-4 mm nodule in the right upper lobe best seen on image number 72 of series 6. Other: None IMPRESSION: CT of the head: Chronic atrophic and ischemic changes. CT of cervical spine: Multilevel degenerative change. 3-4 mm nodule in the right upper lobe. No follow-up needed if patient is low-risk. Non-contrast chest CT can be considered in 12 months if patient is high-risk. This recommendation follows the consensus statement: Guidelines for Management of Incidental Pulmonary Nodules Detected on CT Images: From the Fleischner Society 2017; Radiology 2017; 284:228-243. Electronically Signed   By: Inez Catalina M.D.   On: 09/25/2021 03:19   DG Knee Right Port  Result Date: 09/25/2021 CLINICAL DATA:  Recent fall with known right hip fracture, initial encounter EXAM: PORTABLE RIGHT KNEE - 2 VIEW COMPARISON:  None. FINDINGS: No acute fracture or dislocation is noted. Mild degenerative changes are noted. No joint effusion is seen. IMPRESSION: Mild degenerative change without acute abnormality. Electronically Signed   By: Inez Catalina M.D.   On: 09/25/2021 22:10   DG Hip Unilat W or Wo Pelvis 2-3 Views Right  Result Date: 09/25/2021 CLINICAL DATA:  Fall and right hip pain. EXAM: DG HIP (WITH OR WITHOUT PELVIS) 2-3V RIGHT COMPARISON:  Right hip radiograph dated 01/11/2021. FINDINGS: There is a fracture of the right femoral neck with mild proximal migration and impaction of the femoral shaft. No dislocation. The bones are osteopenic. The soft tissues are unremarkable. Vascular calcifications noted. IMPRESSION: Mildly displaced and impacted right femoral neck fracture. Electronically Signed   By: Anner Crete M.D.   On: 09/25/2021 02:55    Review of Systems  Psychiatric/Behavioral:  Positive for agitation and confusion.   Blood  pressure 123/70, pulse 89, temperature 98.9 F (37.2 C), temperature source Oral, resp. rate 18, height 5\' 1"  (1.549 m), weight 65 kg, SpO2 97 %. Physical Exam Musculoskeletal:     Comments: Obvious shortening and rotation to right hip  NVI in right lower extremity    Assessment/Plan: Right hip fracture: Patient's son gave consent for right hip total arthroplasty. Risks and benefits explained to patient's son. They have elected to proceed with surgical management at this time.   Linda Copeland 09/26/2021, 1:08 PM

## 2021-09-26 NOTE — Progress Notes (Signed)
[facility] PROGRESS NOTE  Linda Copeland  GPQ:[id number] DOB: 11/22/1930 DOA: 09/25/2021 PCP: Linda Arabian, MD Brief Narrative: Linda Copeland is an 87 y.o. female with a history of recent memory decline, suspected dementia, HTN, osteopenia with vertebral compression fractures, severe glaucoma, hypothyroidism who presented to the ED by EMS after an unwitnessed fall at home 12/24 with resultant right hip pain, confirmed to have displaced, impacted right femoral neck fracture for which surgical management is planned 12/26.   Subjective: Pain has been controlled, though delirium, anxiety was severe and currently improved with low dose xanax. No evidence of oversedation. Pt remains confused but denies dyspnea  Objective: BP (!) 129/46 (BP Location: Left Leg)    Pulse 79    Temp 98.9 F (37.2 C) (Oral)    Resp 16    Ht 5\' 1"  (1.549 m)    Wt 65 kg    SpO2 96%    BMI 27.08 kg/m   Gen: Elderly, frail, nontoxic female in no distress Pulm: Clear and nonlabored  CV: RRR, no edema Neuro: Alert, disoriented, not cooperative with full exam. Ext: Warm, dry.  Skin: No rashes, lesions or ulcers on visualized skin.  Assessment & Plan: Closed right hip fracture:  - Operative management 12/26 per orthopedics, d/w Dr. Lyla Copeland. Will transfer to [facility] due to OR availability, keep NPO. Pt is at acceptable risk for cardiovascular perioperative complications with no indication for additional testing at this time.  - VTE ppx per orthopedics - Pain medications postoperatively per orthopedics - Postoperative weight bearing recommendations per ortho and will pursue PT/OT evaluations at that time.    Acute hospital delirium on chronic cognitive impairment:  - Delirium precautions - Pt appears to have anxiety which is improved with low dose xanax    HTN: BP elevated, CrCl at baseline - Continue lisinopril, give prn hydralazine   Glaucoma: Severe, chronic, not acutely worsened.  - Continue home gtt's    Hypothyroidism: TSH 2.046 - Continue synthroid 140mcg   Asymptomatic bacteriuria, without pyuria, negative nitrite. No Tx at this time.  Osteopenia:  - Hold home boniva while admitted   RBBB: Stable. No recent anginal symptoms or symptoms suggestive of CHF.  - Cardiology follow up could be considered.  - Continue telemetry monitoring perioperatively (if pt's agitation allow; may need to DC this to minimize disruptions)   Possible T2DM: This is reported though glucose is ok, pt not on medications that son knows of for it. Check HbA1c in AM.  Linda Pour, MD [facility] [email or web address] 09/26/2021, 8:29 AM

## 2021-09-26 NOTE — Transfer of Care (Signed)
Immediate Anesthesia Transfer of Care Note  Patient: Linda Copeland  Procedure(s) Performed: Procedure(s): TOTAL HIP ARTHROPLASTY ANTERIOR APPROACH (Right)  Patient Location: PACU  Anesthesia Type:General  Level of Consciousness:  sedated, patient cooperative and responds to stimulation  Airway & Oxygen Therapy:Patient Spontanous Breathing and Patient connected to face mask oxgen  Post-op Assessment:  Report given to PACU RN and Post -op Vital signs reviewed and stable  Post vital signs:  Reviewed and stable  Last Vitals:  Vitals:   09/26/21 0435 09/26/21 0905  BP: (!) 129/46 123/70  Pulse: 79 89  Resp:  18  Temp: 37.2 C   SpO2: 17% 47%    Complications: No apparent anesthesia complications

## 2021-09-27 ENCOUNTER — Encounter (HOSPITAL_COMMUNITY): Payer: Self-pay | Admitting: Orthopedic Surgery

## 2021-09-27 DIAGNOSIS — S72001A Fracture of unspecified part of neck of right femur, initial encounter for closed fracture: Secondary | ICD-10-CM | POA: Diagnosis not present

## 2021-09-27 DIAGNOSIS — I10 Essential (primary) hypertension: Secondary | ICD-10-CM | POA: Diagnosis not present

## 2021-09-27 DIAGNOSIS — E039 Hypothyroidism, unspecified: Secondary | ICD-10-CM | POA: Diagnosis not present

## 2021-09-27 DIAGNOSIS — H401133 Primary open-angle glaucoma, bilateral, severe stage: Secondary | ICD-10-CM | POA: Diagnosis not present

## 2021-09-27 LAB — ABO/RH: ABO/RH(D): O POS

## 2021-09-27 LAB — BASIC METABOLIC PANEL
Anion gap: 5 (ref 5–15)
BUN: 18 mg/dL (ref 8–23)
CO2: 24 mmol/L (ref 22–32)
Calcium: 7.7 mg/dL — ABNORMAL LOW (ref 8.9–10.3)
Chloride: 104 mmol/L (ref 98–111)
Creatinine, Ser: 0.59 mg/dL (ref 0.44–1.00)
GFR, Estimated: >60 mL/min (ref >60)
Glucose, Bld: 122 mg/dL — ABNORMAL HIGH (ref 70–99)
Potassium: 3.6 mmol/L (ref 3.5–5.1)
Sodium: 133 mmol/L — ABNORMAL LOW (ref 135–145)

## 2021-09-27 LAB — CBC
HCT: 30.1 % — ABNORMAL LOW (ref 36.0–46.0)
Hemoglobin: 10 g/dL — ABNORMAL LOW (ref 12.0–15.0)
MCH: 29.6 pg (ref 26.0–34.0)
MCHC: 33.2 g/dL (ref 30.0–36.0)
MCV: 89.1 fL (ref 80.0–100.0)
Platelets: 159 10*3/uL (ref 150–400)
RBC: 3.38 MIL/uL — ABNORMAL LOW (ref 3.87–5.11)
RDW: 13.2 % (ref 11.5–15.5)
WBC: 12.3 10*3/uL — ABNORMAL HIGH (ref 4.0–10.5)
nRBC: 0 % (ref 0.0–0.2)

## 2021-09-27 LAB — HEMOGLOBIN A1C
Hgb A1c MFr Bld: 5.6 % (ref 4.8–5.6)
Mean Plasma Glucose: 114.02 mg/dL

## 2021-09-27 MED ORDER — ASPIRIN 81 MG PO CHEW: 81.0000 mg | CHEWABLE_TABLET | Freq: Two times a day (BID) | ORAL | 0 refills | Status: DC

## 2021-09-27 MED ORDER — ALPRAZOLAM 0.25 MG PO TABS
0.2500 mg | ORAL_TABLET | Freq: Two times a day (BID) | ORAL | Status: DC | PRN
  Administered 2021-09-28 – 2021-09-30 (×2): 0.25 mg via ORAL
  Filled 2021-09-27 (×2): qty 1

## 2021-09-27 MED ORDER — HYDROCODONE-ACETAMINOPHEN 5-325 MG PO TABS: 1.0000 | ORAL_TABLET | ORAL | 0 refills | Status: DC | PRN

## 2021-09-27 NOTE — Evaluation (Signed)
Occupational Therapy Evaluation Patient Details Name: Linda Copeland MRN: [id number] DOB: Jul 26, 1931 Today's Date: 09/27/2021   History of Present Illness  This is a pleasant 87 year old female who was brought to the ER for right hip pain after fall.  On 09/26/21 pt underwent Right total hip arthroplasty, anterior approach     Clinical Impression   Patient is currently requiring assistance with bed level ADLs including Total assist with toileting, Total assist with LE dressing, and Total assist with Lower body bathing, as well as moderate to maximum assist with UE ADLs with poor sitting balance and inability to stand without Total Assist of 2 people with very poor balance and poor ability to negotiate RW. Current level of function is below patient's typical baseline where pt takes a few steps by furniture cruising at home but does fall frequently and refuses to use her RW.  During this evaluation, patient was limited by baseline confusion/cognitive impairment, generalized weakness, severely kyphotic posture, impaired activity tolerance, and decreased availability of supervision at home, all of which has the potential to impact patient's safety and independence during functional mobility, as well as performance for ADLs.  Patient lives with one of her son's in a "narrow trailer" with 2 steps to enter that pt needs assist of 2 people to maneuver at baseline.  Pt is incontinent of bowel and bladder and does not typically make it to the [facility] the family places near her chair during the day.  24/7 supervision is not available as all family members must work. Patient demonstrates fair rehab potential, and should benefit from continued skilled occupational therapy services while in acute care to maximize safety, independence and quality of life at home.  Continued occupational therapy services in a SNF setting prior to return home is recommended.  ?     Recommendations for follow up therapy are one component  of a multi-disciplinary discharge planning process, led by the attending physician.  Recommendations may be updated based on patient status, additional functional criteria and insurance authorization.   Follow Up Recommendations  Skilled nursing-short term rehab (<3 hours/day) (Bridge to long term residential care with 24 hour supervision and assistance.)    Assistance Recommended at Discharge Frequent or constant Supervision/Assistance  Functional Status Assessment  Patient has had a recent decline in their functional status and demonstrates the ability to make significant improvements in function in a reasonable and predictable amount of time.  Equipment Recommendations  Other (comment) (TBD)    Recommendations for Other Services       Precautions / Restrictions Precautions Precautions: Fall Restrictions Weight Bearing Restrictions: Yes RLE Weight Bearing: Weight bearing as tolerated      Mobility Bed Mobility Overal bed mobility: Needs Assistance Bed Mobility: Supine to Sit;Sit to Supine     Supine to sit: Total assist;+2 for physical assistance Sit to supine: Total assist;+2 for physical assistance   General bed mobility comments: Verbal cues for sequencing however pt not initiating any movement including on non-surgical LE. Ultimately required Total assist of 2 for both LEs and trunk.    Transfers Overall transfer level: Needs assistance   Transfers: Sit to/from Stand;Bed to chair/wheelchair/BSC Sit to Stand: Total assist;+2 physical assistance   Squat pivot transfers: Total assist;+2 physical assistance;+2 safety/equipment              Balance Overall balance assessment: History of Falls;Needs assistance   Sitting balance-Leahy Scale: Zero Sitting balance - Comments: Uncontrolled anterior lean x 2-3 requiring blocking front and back for safety.  Standing balance support: Bilateral upper extremity supported;During functional activity Standing balance-Leahy  Scale: Zero                             ADL either performed or assessed with clinical judgement   ADL Overall ADL's : Needs assistance/impaired Eating/Feeding: Moderate assistance;Bed level   Grooming: Moderate assistance;Bed level   Upper Body Bathing: Maximal assistance;Bed level   Lower Body Bathing: Total assistance;Bed level   Upper Body Dressing : Maximal assistance;Bed level   Lower Body Dressing: Total assistance;Bed level   Toilet Transfer: +2 for physical assistance;Total assistance;+2 for safety/equipment;BSC/3in1 Toilet Transfer Details (indicate cue type and reason): Attempted pivot with RW, however pt not safe standing with RW and was lowered back to EOB. Performed Bear hug transfer EOB<>BSC with Total Assist of 2 people. Toileting- Clothing Manipulation and Hygiene: Total assistance;Bed level       Functional mobility during ADLs: Total assistance;+2 for physical assistance       Vision Baseline Vision/History: 3 Glaucoma Ability to See in Adequate Light: 3 Highly impaired       Perception Perception Perception: Impaired   Praxis Praxis Praxis: Impaired Praxis Impairment Details: Motor planning;Perseveration;Ideation;Initiation    Pertinent Vitals/Pain Pain Assessment: PAINAD Breathing: normal Negative Vocalization: none     Hand Dominance Right   Extremity/Trunk Assessment Upper Extremity Assessment Upper Extremity Assessment: Difficult to assess due to impaired cognition (Very limited proximal UE ROM due to severe kyphosis.)   Lower Extremity Assessment Lower Extremity Assessment: Defer to PT evaluation   Cervical / Trunk Assessment Cervical / Trunk Assessment: Kyphotic   Communication Communication Communication: HOH   Cognition Arousal/Alertness: Awake/alert Behavior During Therapy: Anxious (Fearful of falling.) Overall Cognitive Status: History of cognitive impairments - at baseline                                  General Comments: Per son, pt has visual hallucinations-sees people in the room who are not there, refuses most medical equipment and falls often.     General Comments       Exercises     Shoulder Instructions      Home Living Family/patient expects to be discharged to:: Skilled nursing facility Living Arrangements: Children (1 son in trailor) Available Help at Discharge: Available PRN/intermittently Type of Home: Mobile home Home Access: Stairs to enter Entrance Stairs-Number of Steps: 2.  Per son, [name], pt required assistance of 2 to negotiate steps. Entrance Stairs-Rails: None Home Layout: One level     Bathroom Shower/Tub:  (Sponge bathing only. Washes hair in sink or goes to have hair done.)         Home Equipment: BSC/3in1;Rolling Walker (2 wheels);Transport chair;Cane - single point   Additional Comments: Back braces.      Prior Functioning/Environment Prior Level of Function : Needs assist;History of Falls (last six months)  Cognitive Assist : ADLs (cognitive)   ADLs (Cognitive): Step by step cues Physical Assist : ADLs (physical)   ADLs (physical): Toileting;Dressing;Bathing;Grooming;IADLs Mobility Comments: Does not like to use walker per son. Recurrent falls. Pt's son makes all the meals before he goes to work. Pt stays in chair with BSC next to chair during the day.  Other son on dialysis, and all other family members work.  Pt does ambulate but family tries to be with her, however pt does not wait. Ambulation is inconsistent, and  pt will grab furniture to steady herself. ADLs Comments: Incontinent at baseline. Pt's son assisted with ADLs prior to admission and helps pt up to large arm chair before he goes to work.        OT Problem List: Decreased strength;Decreased coordination;Decreased range of motion;Decreased cognition;Decreased activity tolerance;Decreased safety awareness;Decreased knowledge of use of DME or AE;Impaired balance (sitting  and/or standing);Impaired vision/perception;Decreased knowledge of precautions;Impaired UE functional use      OT Treatment/Interventions: Self-care/ADL training;Therapeutic exercise;Therapeutic activities;Cognitive remediation/compensation;Visual/perceptual remediation/compensation;DME and/or AE instruction;Patient/family education;Balance training    OT Goals(Current goals can be found in the care plan section) Acute Rehab OT Goals Patient Stated Goal: Son agreeable to SNF recommendation. OT Goal Formulation: With family Time For Goal Achievement: 10/11/21 Potential to Achieve Goals: Fair ADL Goals Pt Will Perform Grooming: with set-up;with supervision;bed level;sitting Pt Will Perform Upper Body Bathing: with min assist (EOB) Pt Will Transfer to Toilet: with min assist;stand pivot transfer (or recliner/EOB) Pt/caregiver will Perform Home Exercise Program: Increased ROM;Increased strength;With minimal assist;Both right and left upper extremity Additional ADL Goal #1: Pt will improve sitting balance to fair without UE support in order to increase safety and participation during seated ADLs.  OT Frequency: Min 2X/week   Barriers to D/C: Decreased caregiver support  Home alone in narrow trailor during the day. All family members must work.       Co-evaluation PT/OT/SLP Co-Evaluation/Treatment: Yes Reason for Co-Treatment: For patient/therapist safety;Complexity of the patient's impairments (multi-system involvement);To address functional/ADL transfers   OT goals addressed during session: ADL's and self-care      AM-PAC OT "6 Clicks" Daily Activity     Outcome Measure Help from another person eating meals?: A Little Help from another person taking care of personal grooming?: A Little Help from another person toileting, which includes using toliet, bedpan, or urinal?: Total Help from another person bathing (including washing, rinsing, drying)?: Total Help from another person to put  on and taking off regular upper body clothing?: A Lot Help from another person to put on and taking off regular lower body clothing?: Total 6 Click Score: 11   End of Session Equipment Utilized During Treatment: Gait belt;Rolling walker (2 wheels) Nurse Communication: Mobility status  Activity Tolerance: Patient tolerated treatment well Patient left: in bed;with call bell/phone within reach;with bed alarm set;with SCD's reapplied  OT Visit Diagnosis: Unsteadiness on feet (R26.81);Other symptoms and signs involving cognitive function;History of falling (Z91.81);Muscle weakness (generalized) (M62.81);Repeated falls (R29.6)                Time: 2197-5883 (+16 min on phone with son gathering PLOF information.) OT Time Calculation (min): 24 min Charges:  OT General Charges $OT Visit: 1 Visit OT Evaluation $OT Eval High Complexity: 1 High  Maicee Ullman, OT Acute Rehab Services Office: [phone] 09/27/2021  Julien Girt 09/27/2021, 10:43 AM

## 2021-09-27 NOTE — TOC CM/SW Note (Signed)
Transition of Care (TOC) -30 day Note       Patient Details   Name: Linda, Copeland  PNT:[id number]  Date of Birth:11/01/1930     Transition of Care [facility]) CM/SW Contact   Name: Dessa Phi [facility]  Phone Number:[phone]  Date:09/27/2021  Time:1:46p     MUST RE:[id number]     To Whom it May Concern:     Please be advised that the above patient will require a short-term nursing home stay, anticipated 30 days or less rehabilitation and strengthening. The plan is for return home.

## 2021-09-27 NOTE — TOC Initial Note (Incomplete Revision)
Transition of Care [facility]) - Initial/Assessment Note    Patient Details  Name: Linda Copeland MRN: [id number] Date of Birth: 07/04/1931  [facility]) CM/SW Contact:    Dessa Phi, RN Phone Number: 09/27/2021, 1:55 PM  Clinical Narrative:Dementia;spoke to both sons Linda Copeland, & Linda Copeland-both agree to d/c to [place] SNF-faxed out await pasrr, & bed offers prior starting auth                  Expected Discharge Plan: [place] Barriers to Discharge: Continued Medical Work up   Patient Goals and CMS Choice Patient states their goals for this hospitalization and ongoing recovery are:: rehab   Choice offered to / list presented to : Adult Children  Expected Discharge Plan and Services Expected Discharge Plan: [place]   Discharge Planning Services: CM Consult   Living arrangements for the past 2 months: Single Family Home                                      Prior Living Arrangements/Services Living arrangements for the past 2 months: Single Family Home Lives with:: Adult Children Patient language and need for interpreter reviewed:: Yes Do you feel safe going back to the place where you live?: Yes      Need for Family Participation in Patient Care: No (Comment) Care giver support system in place?: Yes (comment)   Criminal Activity/Legal Involvement Pertinent to Current Situation/Hospitalization: No - Comment as needed  Activities of Daily Living Home Assistive Devices/Equipment: Walker (specify type) ADL Screening (condition at time of admission) Patient's cognitive ability adequate to safely complete daily activities?: No Is the patient deaf or have difficulty hearing?: Yes Does the patient have difficulty seeing, even when wearing glasses/contacts?: No Does the patient have difficulty concentrating, remembering, or making decisions?: Yes Patient able to express need for assistance with ADLs?: Yes Does the patient have difficulty  dressing or bathing?: Yes Independently performs ADLs?: No Communication: Needs assistance Dressing (OT): Needs assistance Grooming: Needs assistance Feeding: Needs assistance Bathing: Needs assistance Toileting: Needs assistance In/Out Bed: Needs assistance Does the patient have difficulty walking or climbing stairs?: Yes Weakness of Legs: Both Weakness of Arms/Hands: Both  Permission Sought/Granted Permission sought to share information with : Case Manager Permission granted to share information with : Yes, Verbal Permission Granted        Permission granted to share info w Relationship: John(son) [phone]/Linda Copeland(son)[phone]     Emotional Assessment Appearance:: Appears stated age Attitude/Demeanor/Rapport: Gracious Affect (typically observed): Accepting Orientation: : Oriented to Self Alcohol / Substance Use: Not Applicable Psych Involvement: No (comment)  Admission diagnosis:  Hip fracture [facility]) [S72.009A] Surgery, elective [Z41.9] Fall [W19.XXXA] Closed fracture of neck of right femur, initial encounter ([place]) [S72.001A] Fall, initial encounter [W19.XXXA] Closed displaced fracture of right femoral neck ([place]) [S72.001A] Patient Active Problem List   Diagnosis Date Noted   Hip fracture ([place]) 09/25/2021   Hypertension    Hypothyroidism    Primary open angle glaucoma (POAG) of both eyes, severe stage 03/21/2019   Closed fracture of nasal bone with routine healing 10/11/2016   Subdural hematoma 10/05/2016   PCP:  Gaynelle Arabian, MD Pharmacy:   [facility], [place] - [address] AT [address] [place] [address] Phone: [phone] Fax: [phone]     Social  Determinants of Health (SDOH) Interventions    Readmission Risk Interventions No flowsheet data found.

## 2021-09-27 NOTE — TOC Initial Note (Signed)
Transition of Care [facility]) - Initial/Assessment Note    Patient Details  Name: Linda Copeland MRN: [id number] Date of Birth: Feb 22, 1931  Transition of Care [facility]) CM/SW Contact:    Dessa Phi, RN Phone Number: 09/27/2021, 1:55 PM  Clinical Narrative:                   Expected Discharge Plan: [place] Barriers to Discharge: Continued Medical Work up [place] to both sons [name], & Scott-both agree to [facility]-faxed out await pasrr, & bed offers prior starting auth.  Patient Goals and CMS Choice Patient states their goals for this hospitalization and ongoing recovery are:: rehab   Choice offered to / list presented to : Adult Children  Expected Discharge Plan and Services Expected Discharge Plan: [place]   Discharge Planning Services: CM Consult   Living arrangements for the past 2 months: Single Family Home                                      Prior Living Arrangements/Services Living arrangements for the past 2 months: Single Family Home Lives with:: Adult Children Patient language and need for interpreter reviewed:: Yes Do you feel safe going back to the place where you live?: Yes      Need for Family Participation in Patient Care: No (Comment) Care giver support system in place?: Yes (comment)   Criminal Activity/Legal Involvement Pertinent to Current Situation/Hospitalization: No - Comment as needed  Activities of Daily Living Home Assistive Devices/Equipment: Walker (specify type) ADL Screening (condition at time of admission) Patient's cognitive ability adequate to safely complete daily activities?: No Is the patient deaf or have difficulty hearing?: Yes Does the patient have difficulty seeing, even when wearing glasses/contacts?: No Does the patient have difficulty concentrating, remembering, or making decisions?: Yes Patient able to express need for assistance with ADLs?: Yes Does the patient have difficulty  dressing or bathing?: Yes Independently performs ADLs?: No Communication: Needs assistance Dressing (OT): Needs assistance Grooming: Needs assistance Feeding: Needs assistance Bathing: Needs assistance Toileting: Needs assistance In/Out Bed: Needs assistance Does the patient have difficulty walking or climbing stairs?: Yes Weakness of Legs: Both Weakness of Arms/Hands: Both  Permission Sought/Granted Permission sought to share information with : Case Manager Permission granted to share information with : Yes, Verbal Permission Granted        Permission granted to share info w Relationship: John(son) [phone]/Scott(son)[phone]     Emotional Assessment Appearance:: Appears stated age Attitude/Demeanor/Rapport: Gracious Affect (typically observed): Accepting Orientation: : Oriented to Self Alcohol / Substance Use: Not Applicable Psych Involvement: No (comment)  Admission diagnosis:  Hip fracture [facility]) [S72.009A] Surgery, elective [Z41.9] Fall [W19.XXXA] Closed fracture of neck of right femur, initial encounter ([place]) [S72.001A] Fall, initial encounter [W19.XXXA] Closed displaced fracture of right femoral neck ([place]) [S72.001A] Patient Active Problem List   Diagnosis Date Noted   Hip fracture ([place]) 09/25/2021   Hypertension    Hypothyroidism    Primary open angle glaucoma (POAG) of both eyes, severe stage 03/21/2019   Closed fracture of nasal bone with routine healing 10/11/2016   Subdural hematoma 10/05/2016   PCP:  Gaynelle Arabian, MD Pharmacy:   [facility], [place] - [place] AT [address] [place] [address] Phone: [phone] Fax: [phone]  Social Determinants of Health (SDOH) Interventions    Readmission Risk Interventions No flowsheet data found.

## 2021-09-27 NOTE — Progress Notes (Signed)
° ° °  Subjective:  Patient confused this AM but does not appear to be in any distress. RN states that she has seen patient move right foot and wiggle toes this AM.   Objective:   VITALS:   Vitals:   09/26/21 2127 09/26/21 2351 09/27/21 0146 09/27/21 0517  BP: (!) 93/58 108/61 93/60 118/65  Pulse: 83 84 78 84  Resp: 18  20 20   Temp: 99 F (37.2 C) 97.8 F (36.6 C) 98.2 F (36.8 C) 98.2 F (36.8 C)  TempSrc: Oral Oral Oral Oral  SpO2:  98% 97% 96%  Weight:      Height:        NAD ABD soft Neurovascular intact Sensation intact distally Intact pulses distally Dorsiflexion/Plantar flexion intact Incision: dressing C/D/I   Lab Results  Component Value Date   WBC 12.3 (H) 09/27/2021   HGB 10.0 (L) 09/27/2021   HCT 30.1 (L) 09/27/2021   MCV 89.1 09/27/2021   PLT 159 09/27/2021   BMET    Component Value Date/Time   NA 133 (L) 09/27/2021 0434   K 3.6 09/27/2021 0434   CL 104 09/27/2021 0434   CO2 24 09/27/2021 0434   GLUCOSE 122 (H) 09/27/2021 0434   BUN 18 09/27/2021 0434   CREATININE 0.59 09/27/2021 0434   CALCIUM 7.7 (L) 09/27/2021 0434   GFRNONAA >60 09/27/2021 0434     Assessment/Plan: 1 Day Post-Op   Principal Problem:   Hip fracture (HCC) Active Problems:   Primary open angle glaucoma (POAG) of both eyes, severe stage   Hypertension   Hypothyroidism   WBAT with walker DVT ppx: Aspirin, SCDs, TEDS PO pain control PT/OT Dispo: Once cleared by PT/OT, okay to discharge from ortho standpoint.  Rx will be with chart. F/U in 2 weeks at office     Dorothyann Peng 09/27/2021, 8:22 AM  [facility] is now [employer] [address] [place], [place], [place] [address] Phone: [phone] [email or web address]   [employer]

## 2021-09-27 NOTE — Progress Notes (Signed)
Pt was confused again, so putting mittens back on.  Pt stated last night she was not sure to swallow whole medications. Crushed all medications and had them w/ applesauce.   Berton Mount, RN 09/27/2021 5:33 AM

## 2021-09-27 NOTE — Evaluation (Addendum)
Physical Therapy Evaluation Patient Details Name: Linda Copeland MRN: [id number] DOB: 06-Apr-1931 Today's Date: 09/27/2021  History of Present Illness  87 yo female admitted after falling at home-sustained a R hip fx. S/P R THA-DA 09/26/21. Hx of dementia, hearing loss, osteopenia, SDH, R humerus fx 2018, compression fxs, cervical radiculopathy  Clinical Impression  On eval, pt required Total Assist +2 for mobility. She required multimodal 1 step commands during session. She is very anxious and fearful of falling. She is also hallucinating a bit. Assisted pt onto/off of bsc with squat pivot. She was unable to safely stand with RW on today. Assisted pt back to bed at end of session. No family present during session. PT recommendation is for ST SNF rehab. Will follow during hospital stay.        Recommendations for follow up therapy are one component of a multi-disciplinary discharge planning process, led by the attending physician.  Recommendations may be updated based on patient status, additional functional criteria and insurance authorization.  Follow Up Recommendations Skilled nursing-short term rehab (<3 hours/day)    Assistance Recommended at Discharge Frequent or constant Supervision/Assistance  Functional Status Assessment Patient has had a recent decline in their functional status and demonstrates the ability to make significant improvements in function in a reasonable and predictable amount of time.  Equipment Recommendations  None recommended by PT    Recommendations for Other Services       Precautions / Restrictions Precautions Precautions: Fall Restrictions Weight Bearing Restrictions: No RLE Weight Bearing: Weight bearing as tolerated      Mobility  Bed Mobility Overal bed mobility: Needs Assistance Bed Mobility: Supine to Sit;Sit to Supine     Supine to sit: Total assist;+2 for physical assistance Sit to supine: Total assist;+2 for physical assistance   General  bed mobility comments: Multimodal cueing required.Minimal attempt to move bil LEs with pt stating "I can't move my legs." Increased time. Utilized bedpad to assist with scooting, positioning at EOB. Flexed posture with Min-Mod A to maintain sitting balance    Transfers Overall transfer level: Needs assistance Equipment used: Rolling walker (2 wheels);None Transfers: Sit to/from Stand;Bed to chair/wheelchair/BSC Sit to Stand: Total assist;+2 physical assistance;+2 safety/equipment     Squat pivot transfers: Total assist;+2 physical assistance;+2 safety/equipment     General transfer comment: Attempted stand with RW x 2-pt able to partially stand but unable to support own weight or maintain proper UE positioning on walker. Squat pivot x 2, bed<>bsc.    Ambulation/Gait                  Stairs            Wheelchair Mobility    Modified Rankin (Stroke Patients Only)       Balance Overall balance assessment: Needs assistance;History of Falls Sitting-balance support: Bilateral upper extremity supported;Feet supported Sitting balance-Leahy Scale: Poor Sitting balance - Comments: Inconsistent use of UEs to help with maintaining sitting balance. Min-Mod A for static sitting balance with blocking in front for safety.   Standing balance support: Bilateral upper extremity supported Standing balance-Leahy Scale: Zero                               Pertinent Vitals/Pain Pain Assessment: Faces Faces Pain Scale: No hurt Breathing: normal Negative Vocalization: none    Home Living Family/patient expects to be discharged to:: Skilled nursing facility Living Arrangements: Children (son) Available Help at Discharge: Available PRN/intermittently  Type of Home: Mobile home Home Access: Stairs to enter Entrance Stairs-Rails: None Entrance Stairs-Number of Steps: 2.  Per son, [name], pt required assistance of 2 to negotiate steps.   Home Layout: One level Home  Equipment: BSC/3in1;Rolling Walker (2 wheels);Transport chair;Cane - single point Additional Comments: Back braces.    Prior Function Prior Level of Function : Needs assist  Cognitive Assist : ADLs (cognitive)   ADLs (Cognitive): Step by step cues Physical Assist : ADLs (physical)   ADLs (physical): Toileting;Dressing;Bathing;Grooming;IADLs Mobility Comments: Does not like to use walker per son. Recurrent falls. Pt's son makes all the meals before he goes to work. Pt stays in chair with BSC next to chair during the day.  Other son on dialysis, and all other family members work.  Pt does ambulate but family tries to be with her, however pt does not wait. Ambulation is inconsistent, and pt will grab furniture to steady herself. ADLs Comments: Incontinent at baseline. Pt's son assisted with ADLs prior to admission and helps pt up to large arm chair before he goes to work.     Hand Dominance   Dominant Hand: Right    Extremity/Trunk Assessment   Upper Extremity Assessment Upper Extremity Assessment: Defer to OT evaluation    Lower Extremity Assessment Lower Extremity Assessment: Generalized weakness;Difficult to assess due to impaired cognition    Cervical / Trunk Assessment Cervical / Trunk Assessment: Kyphotic  Communication   Communication: HOH  Cognition Arousal/Alertness: Awake/alert Behavior During Therapy: Anxious (fearful of falling) Overall Cognitive Status: History of cognitive impairments - at baseline                                 General Comments: Per son, pt has visual hallucinations-sees people in the room who are not there, refuses most medical equipment and falls often.        General Comments      Exercises     Assessment/Plan    PT Assessment Patient needs continued PT services  PT Problem List Decreased strength;Decreased mobility;Decreased activity tolerance;Decreased balance;Decreased knowledge of use of DME;Pain;Decreased  cognition;Decreased safety awareness       PT Treatment Interventions DME instruction;Gait training;Therapeutic activities;Therapeutic exercise;Patient/family education;Balance training;Functional mobility training    PT Goals (Current goals can be found in the Care Plan section)  Acute Rehab PT Goals Patient Stated Goal: none stated PT Goal Formulation: Patient unable to participate in goal setting Time For Goal Achievement: 10/11/21 Potential to Achieve Goals: Fair    Frequency Min 3X/week   Barriers to discharge        Co-evaluation PT/OT/SLP Co-Evaluation/Treatment: Yes Reason for Co-Treatment: For patient/therapist safety PT goals addressed during session: Mobility/safety with mobility;Balance;Proper use of DME OT goals addressed during session: ADL's and self-care       AM-PAC PT "6 Clicks" Mobility  Outcome Measure Help needed turning from your back to your side while in a flat bed without using bedrails?: Total Help needed moving from lying on your back to sitting on the side of a flat bed without using bedrails?: Total Help needed moving to and from a bed to a chair (including a wheelchair)?: Total Help needed standing up from a chair using your arms (e.g., wheelchair or bedside chair)?: Total Help needed to walk in hospital room?: Total Help needed climbing 3-5 steps with a railing? : Total 6 Click Score: 6    End of Session Equipment Utilized During Treatment:  Gait belt Activity Tolerance: Patient tolerated treatment well Patient left: in bed;with call bell/phone within reach;with bed alarm set   PT Visit Diagnosis: Muscle weakness (generalized) (M62.81);History of falling (Z91.81);Repeated falls (R29.6);Pain;Difficulty in walking, not elsewhere classified (R26.2)    Time: 0950-1006 PT Time Calculation (min) (ACUTE ONLY): 16 min   Charges:   PT Evaluation $PT Eval Moderate Complexity: [place], PT Acute Rehabilitation  Office:  [phone] Pager: [phone]

## 2021-09-27 NOTE — NC FL2 (Signed)
[place] LEVEL OF CARE SCREENING TOOL     IDENTIFICATION  Patient Name: Linda Copeland Birthdate: [birth year] Sex: female Admission Date (Current Location): 09/25/2021  [facility] and [place] Number:  Herbalist and Address:  [facility],  [place], [place]      Provider Number: [id number]  Attending Physician Name and Address:  Alma Friendly, MD  Relative Name and Phone Number:  [name] Tatar son [phone]    Current Level of Care: Hospital Recommended Level of Care: [place] Prior Approval Number:    Date Approved/Denied:   PASRR Number:    Discharge Plan: SNF    Current Diagnoses: Patient Active Problem List   Diagnosis Date Noted   Hip fracture ([place]) 09/25/2021   Hypertension    Hypothyroidism    Primary open angle glaucoma (POAG) of both eyes, severe stage 03/21/2019   Closed fracture of nasal bone with routine healing 10/11/2016   Subdural hematoma 10/05/2016    Orientation RESPIRATION BLADDER Height & Weight     Self  Normal Incontinent Weight: 65 kg Height:  5\' 1"  (154.9 cm)  BEHAVIORAL SYMPTOMS/MOOD NEUROLOGICAL BOWEL NUTRITION STATUS      Incontinent Diet (Heart Healthy)  AMBULATORY STATUS COMMUNICATION OF NEEDS Skin   Limited Assist Verbally Normal                       Personal Care Assistance Level of Assistance  Bathing, Feeding, Dressing Bathing Assistance: Limited assistance Feeding assistance: Limited assistance Dressing Assistance: Limited assistance     Functional Limitations Info  Sight, Hearing, Speech Sight Info: Impaired (wears eyeglasses-currently they are lost.) Hearing Info: Adequate Speech Info: Impaired (Partials-top)    SPECIAL CARE FACTORS FREQUENCY                       Contractures Contractures Info: Not present    Additional Factors Info  Code Status, Allergies, Psychotropic, Insulin Sliding Scale Code Status Info:   (Full) Allergies Info:  (Codeine;Biaxin;Caffeine) Psychotropic Info:  (xanax see MAR) Insulin Sliding Scale Info:  (SSI)       Current Medications (09/27/2021):  This is the current hospital active medication list Current Facility-Administered Medications  Medication Dose Route Frequency Provider Last Rate Last Admin   0.9 %  sodium chloride infusion   Intravenous Continuous Rod Can, MD 50 mL/hr at 09/27/21 1224 New Bag at 09/27/21 1224   acetaminophen (TYLENOL) tablet 325-650 mg  325-650 mg Oral Q6H PRN Rod Can, MD   650 mg at 09/27/21 4540   ALPRAZolam (XANAX) tablet 0.25 mg  0.25 mg Oral BID PRN Alma Friendly, MD       aspirin EC tablet 325 mg  325 mg Oral Q breakfast Rod Can, MD   325 mg at 09/27/21 9811   docusate sodium (COLACE) capsule 100 mg  100 mg Oral BID Rod Can, MD   100 mg at 09/27/21 9147   dorzolamide (TRUSOPT) 2 % ophthalmic solution 1 drop  1 drop Right Eye BID Rod Can, MD   1 drop at 09/27/21 0836   hydrALAZINE (APRESOLINE) injection 10 mg  10 mg Intravenous Q4H PRN Swinteck, Aaron Edelman, MD       HYDROcodone-acetaminophen (NORCO) 7.5-325 MG per tablet 1-2 tablet  1-2 tablet Oral Q4H PRN Swinteck, Aaron Edelman, MD       HYDROcodone-acetaminophen (NORCO/VICODIN) 5-325 MG per tablet 1-2 tablet  1-2 tablet Oral Q4H PRN Swinteck,  Aaron Edelman, MD       latanoprost (XALATAN) 0.005 % ophthalmic solution 1 drop  1 drop Both Eyes QHS Swinteck, Aaron Edelman, MD   1 drop at 09/25/21 2214   levothyroxine (SYNTHROID) tablet 100 mcg  100 mcg Oral QAC breakfast Rod Can, MD   100 mcg at 09/27/21 0524   lisinopril (ZESTRIL) tablet 20 mg  20 mg Oral Daily Swinteck, Aaron Edelman, MD   20 mg at 09/27/21 9563   menthol-cetylpyridinium (CEPACOL) lozenge 3 mg  1 lozenge Oral PRN Rod Can, MD       Or   phenol (CHLORASEPTIC) mouth spray 1 spray  1 spray Mouth/Throat PRN Swinteck, Aaron Edelman, MD       methocarbamol (ROBAXIN) tablet 500 mg  500 mg Oral Q6H PRN Swinteck,  Aaron Edelman, MD       Or   methocarbamol (ROBAXIN) 500 mg in dextrose 5 % 50 mL IVPB  500 mg Intravenous Q6H PRN Swinteck, Aaron Edelman, MD       metoCLOPramide (REGLAN) tablet 5-10 mg  5-10 mg Oral Q8H PRN Swinteck, Aaron Edelman, MD       Or   metoCLOPramide (REGLAN) injection 5-10 mg  5-10 mg Intravenous Q8H PRN Swinteck, Aaron Edelman, MD       morphine 2 MG/ML injection 0.5-1 mg  0.5-1 mg Intravenous Q2H PRN Swinteck, Aaron Edelman, MD       ondansetron (ZOFRAN) tablet 4 mg  4 mg Oral Q6H PRN Swinteck, Aaron Edelman, MD       Or   ondansetron (ZOFRAN) injection 4 mg  4 mg Intravenous Q6H PRN Swinteck, Aaron Edelman, MD       polyethylene glycol (MIRALAX / GLYCOLAX) packet 17 g  17 g Oral Daily PRN Swinteck, Aaron Edelman, MD       senna (SENOKOT) tablet 8.6 mg  1 tablet Oral BID Rod Can, MD   8.6 mg at 09/27/21 8756   sodium phosphate (FLEET) 7-19 GM/118ML enema 1 enema  1 enema Rectal Once PRN Swinteck, Aaron Edelman, MD       sorbitol 70 % solution 30 mL  30 mL Oral Daily PRN Swinteck, Aaron Edelman, MD       traMADol Veatrice Bourbon) tablet 50 mg  50 mg Oral Q6H Rod Can, MD   50 mg at 09/27/21 1224     Discharge Medications: Please see discharge summary for a list of discharge medications.  Relevant Imaging Results:  Relevant Lab Results:   Psychologist, forensic;EP#[id number] [place]  Dessa Phi, RN

## 2021-09-27 NOTE — Progress Notes (Addendum)
[facility] PROGRESS NOTE  Linda Copeland  WNU:[id number] DOB: 24-Feb-1931 DOA: 09/25/2021 PCP: Gaynelle Arabian, MD  Brief Narrative: Linda Copeland is an 87 y.o. female with a history of recent memory decline, suspected dementia, HTN, osteopenia with vertebral compression fractures, severe glaucoma, hypothyroidism who presented to the ED by EMS after an unwitnessed fall at home 12/24 with resultant right hip pain, confirmed to have displaced, impacted right femoral neck fracture s/p surgical repair on 09/26/21.    Subjective: Patient seems to be hard of hearing, denies any new complaints today.  Postop pain under control.  Patient denies any chest pain, shortness of breath, abdominal pain, nausea/vomiting, fever/chills   Objective: BP 118/65 (BP Location: Right Arm)    Pulse 84    Temp 98.2 F (36.8 C) (Oral)    Resp 20    Ht 5\' 1"  (1.549 m)    Wt 65 kg    SpO2 95%    BMI 27.08 kg/m    General: NAD, elderly, very frail, HOH, alert Cardiovascular: S1, S2 present Respiratory: CTAB Abdomen: Soft, nontender, nondistended, bowel sounds present Musculoskeletal: No bilateral pedal edema noted, R hip bruising noted, dressing C/D/I Skin: As noted above Psychiatry: Normal mood    Assessment & Plan:  Displaced right femoral neck fracture S/p right total hip arthroplasty on 09/26/2021 by Dr. Lyla Glassing orthopedics VTE ppx, postop pain meds per orthopedics PT/OT-recommend SNF  Leukocytosis Afebrile, likely reactive 2/2 recent surgery No signs of infection Daily CBC  Acute blood loss anemia Baseline hemoglobin around 13 Estimated blood loss during surgery was around 250 mls Monitor closely, daily CBC   Acute hospital delirium on chronic cognitive impairment Delirium precautions Pt appears to have anxiety, started on low dose xanax   Hyponatremia Mild Daily BMP   HTN BP stable Continue lisinopril (adjust prn), also on prn hydralazine   Glaucoma Severe, chronic, not acutely  worsened Continue home gtt's   Hypothyroidism TSH 2.046 Continue synthroid 137mcg   Osteopenia Hold home boniva while admitted   RBBB Stable. No recent anginal symptoms Continue telemetry monitoring perioperatively     Alma Friendly, MD [facility]  09/27/2021, 11:24 AM

## 2021-09-28 ENCOUNTER — Ambulatory Visit: Payer: Medicare HMO | Admitting: Podiatry

## 2021-09-28 DIAGNOSIS — D62 Acute posthemorrhagic anemia: Secondary | ICD-10-CM

## 2021-09-28 DIAGNOSIS — R4189 Other symptoms and signs involving cognitive functions and awareness: Secondary | ICD-10-CM

## 2021-09-28 DIAGNOSIS — I10 Essential (primary) hypertension: Secondary | ICD-10-CM | POA: Diagnosis not present

## 2021-09-28 DIAGNOSIS — Z7189 Other specified counseling: Secondary | ICD-10-CM

## 2021-09-28 DIAGNOSIS — S72001A Fracture of unspecified part of neck of right femur, initial encounter for closed fracture: Secondary | ICD-10-CM | POA: Diagnosis not present

## 2021-09-28 DIAGNOSIS — E039 Hypothyroidism, unspecified: Secondary | ICD-10-CM | POA: Diagnosis not present

## 2021-09-28 DIAGNOSIS — R410 Disorientation, unspecified: Secondary | ICD-10-CM

## 2021-09-28 DIAGNOSIS — H401133 Primary open-angle glaucoma, bilateral, severe stage: Secondary | ICD-10-CM | POA: Diagnosis not present

## 2021-09-28 LAB — HEMOGLOBIN AND HEMATOCRIT, BLOOD
HCT: 26.5 % — ABNORMAL LOW (ref 36.0–46.0)
Hemoglobin: 8.8 g/dL — ABNORMAL LOW (ref 12.0–15.0)

## 2021-09-28 LAB — BASIC METABOLIC PANEL
Anion gap: 7 (ref 5–15)
BUN: 12 mg/dL (ref 8–23)
CO2: 24 mmol/L (ref 22–32)
Calcium: 8.3 mg/dL — ABNORMAL LOW (ref 8.9–10.3)
Chloride: 105 mmol/L (ref 98–111)
Creatinine, Ser: 0.4 mg/dL — ABNORMAL LOW (ref 0.44–1.00)
GFR, Estimated: >60 mL/min (ref >60)
Glucose, Bld: 104 mg/dL — ABNORMAL HIGH (ref 70–99)
Potassium: 3.7 mmol/L (ref 3.5–5.1)
Sodium: 136 mmol/L (ref 135–145)

## 2021-09-28 LAB — CBC
HCT: 27.2 % — ABNORMAL LOW (ref 36.0–46.0)
Hemoglobin: 9 g/dL — ABNORMAL LOW (ref 12.0–15.0)
MCH: 30 pg (ref 26.0–34.0)
MCHC: 33.1 g/dL (ref 30.0–36.0)
MCV: 90.7 fL (ref 80.0–100.0)
Platelets: 151 10*3/uL (ref 150–400)
RBC: 3 MIL/uL — ABNORMAL LOW (ref 3.87–5.11)
RDW: 13.4 % (ref 11.5–15.5)
WBC: 10.1 10*3/uL (ref 4.0–10.5)
nRBC: 0 % (ref 0.0–0.2)

## 2021-09-28 MED ORDER — OXYCODONE HCL 5 MG PO TABS: 5.0000 mg | ORAL_TABLET | Freq: Three times a day (TID) | ORAL | Status: DC | PRN

## 2021-09-28 MED ORDER — DOCUSATE SODIUM 50 MG/5ML PO LIQD
100.0000 mg | Freq: Two times a day (BID) | ORAL | Status: DC
  Administered 2021-09-29 – 2021-09-30 (×3): 100 mg via ORAL
  Filled 2021-09-28 (×5): qty 10

## 2021-09-28 MED ORDER — ACETAMINOPHEN 500 MG PO TABS
1000.0000 mg | ORAL_TABLET | Freq: Three times a day (TID) | ORAL | Status: DC
  Administered 2021-09-28 – 2021-09-30 (×6): 1000 mg via ORAL
  Filled 2021-09-28 (×7): qty 2

## 2021-09-28 MED ORDER — METHOCARBAMOL 500 MG PO TABS: 500.0000 mg | ORAL_TABLET | Freq: Three times a day (TID) | ORAL | Status: DC | PRN

## 2021-09-28 MED ORDER — POLYETHYLENE GLYCOL 3350 17 G PO PACK: 17.0000 g | PACK | Freq: Two times a day (BID) | ORAL | Status: DC | PRN

## 2021-09-28 NOTE — TOC Progression Note (Signed)
Transition of Care [facility]) - Progression Note    Patient Details  Name: Linda Copeland MRN: [id number] Date of Birth: November 06, 1930  Transition of Care [facility]) CM/SW Contact  Rockney Grenz, Juliann Pulse, RN Phone Number: 09/28/2021, 10:36 AM  Clinical Narrative:  Received pasrr#;bed offers given-await choice.   1. 1.3 mi [facility] [place], [place] [address] [phone] Overall rating Above average 2. 1.6 mi [place] [facility] [place], [place] [address] [phone] Overall rating Much below average 3. 2.1 mi [place] [place], [place] [address] [phone] Overall rating Much below average 4. 2.5 mi [facility] [place], [place] [address] [phone] Overall rating Below average 5. 2.8 mi [facility] at the [place], [place] [address] [phone] Overall rating Average 6. 2.8 mi [place] [address] [place], [employer] [place] [address] [phone] Overall rating Much below average 7. 3 mi [facility] [place], [place] [address] [phone] Overall rating Above average 8. 3.6 [place] [address] [place], [place] [address] [phone] Overall rating Average 9. 3.6 [facility] [address] [place], [place] [address] [phone] Overall rating Much below average 10. 3.9 mi [facility] [place], [place] [address] [phone] Overall rating Much below average 11. 4.4 mi [facility] [place], [place] [address] [phone] Overall rating Much above average 12. 4.6 mi [facility] [place], [place] [address] [phone] Overall rating Much above average 13. 5.5 mi [facility] [address] [place], [place] [address] [phone] Overall rating Above average 14. 8.2 [facility] [place], [place] [address] [phone] Overall rating Much above average 15. 9 mi [facility] [address] [place], [place] [address] [phone] Overall rating Below average 16. 9.1 [place] and [place], [place] [address] [phone] Overall rating Much below average 17. 9.2 mi [facility] [address] [place], [place] [address] [phone] Overall rating Much above average 18. 10.8 mi [place] [facility] [address] [place], [place] [address] [phone] Overall rating Much above average 19. 12.6 mi [facility] [address] [place], [place] [address] [phone] Overall rating Much below average 20. 12.8 [facility] [place], [place] [address] [phone] Overall rating Much below average 21. 14.2 mi [facility] [place] [facility] [address] [place], [place] [address] [phone] Overall rating Below average 22. 14.4 mi [facility] [place], [place] [address] [phone] Overall rating Above average 23. 14.8 mi [place] and [facility] [place], [place] [address] [phone] Overall rating Much above average 24. 14.9 [place] [address] [place], [place] [address] [phone] Overall rating Much below average 25. 16.5 mi [place] [address] [place], [place] [address] [phone] Overall rating Average 26. 16.7 mi [facility] [place],  [address] [phone] Overall rating Above average 27. 17.9 mi [place]  [place], [place] [address] [phone] Overall rating Average 28. 47.6 [facility] [address] [place], [place] [address] [phone] Overall rating Much below average 29. 19.7 mi [place] [address] [place], [place] [address] [phone] Overall rating Much below average 30. 20 mi [address] at the [facility], [place] [address] [phone] Overall rating Much above average 31. 21.1 mi [facility] [place], [place] [address] [phone] Overall rating Much below average 32. 21.6 [address] [place], [place] [address] [phone] Overall rating Below average 33. 63.8 [facility] [address] [place], [place] [address] [phone] Overall rating Below average 34. 21.8 [place], [place] [address] [phone] Overall rating Above average 35. [address] [place], [place] [address] [phone] Overall rating Much above average 36. 22.6 mi [facility] [address] [place], [place] [address] [phone] Overall rating Average 37. 22.7 mi [facility] [place], [place] [address] [phone] Overall rating Much below average 38. 23.3 mi [facility] [address] [place], [place] [address] [phone] Overall rating Above average 39. 23.5 [place], [place] [address] [phone] Overall rating Not available18 40. 24.1 mi [place] [address] [place], [place] [address] [phone] Overall rating Much below average 41. 24.2 mi [place] [address] [place], [place] [address] [phone] Overall rating Below average 42. 24.4 [facility] Care/[place] [address] [place], [place] [address] [phone] Overall rating Much below average 43. 24.5 mi [facility] [place],  [address] [phone] Overall rating Above average To explore and dow  Expected Discharge Plan: [place] Barriers to Discharge: Continued Medical Work up  Expected Discharge Plan and Services Expected Discharge Plan: [place]   Discharge Planning Services: CM Consult   Living arrangements for the past 2 months: Single Family Home                                       Social Determinants of Health (SDOH) Interventions    Readmission Risk Interventions No flowsheet data found.

## 2021-09-28 NOTE — Plan of Care (Signed)

## 2021-09-28 NOTE — Progress Notes (Signed)
PROGRESS NOTE  Linda Copeland KYH:[id number] DOB: September 07, 1931   PCP: Gaynelle Arabian, MD  Patient is from: Home.   DOA: 09/25/2021 LOS: 3  Chief complaints:  Chief Complaint  Patient presents with   Fall / Right Hip pain      Brief Narrative / Interim history: 87 y.o. female with a history of recent memory decline/suspected dementia, HTN, osteopenia with vertebral compression fractures, severe glaucoma, hypothyroidism who presented to the ED by EMS after an unwitnessed fall at home 12/24 with resultant right hip pain, confirmed to have displaced, impacted right femoral neck fracture s/p surgical repair on 09/26/21.   Subjective: Seen and examined earlier this morning.  No major events overnight of this morning.  No complaints but not a reliable historian.  She is only oriented to self.  Follows some commands.  Responds no to pain.  Does not appear to be in distress.  Objective: Vitals:   09/27/21 1423 09/27/21 2156 09/28/21 0410 09/28/21 1349  BP: 105/66 112/67 130/70 (!) 148/74  Pulse: 82 76 80 95  Resp: 18 20 18 18   Temp: 98.6 F (37 C) 97.6 F (36.4 C) 98 F (36.7 C) 98.2 F (36.8 C)  TempSrc: Oral  Axillary Oral  SpO2: 92% 94% 95% 90%  Weight:      Height:        Examination:  GENERAL: No apparent distress.  Nontoxic. HEENT: MMM.  Vision and hearing grossly intact.  NECK: Supple.  No apparent JVD.  RESP: 90% on RA.  No IWOB.  Fair aeration bilaterally. CVS:  RRR. Heart sounds normal.  ABD/GI/GU: BS+. Abd soft, NTND.  MSK/EXT:  Moves extremities. No apparent deformity. No edema.  SKIN: no apparent skin lesion or wound.  Dressing over surgical site DCI. NEURO: Awake.  Oriented only to self.  No apparent focal neuro deficit. PSYCH: Calm. Normal affect.   Procedures:  09/26/2021-Right total hip arthroplasty, anterior approach.   Microbiology summarized: COVID-19 and influenza PCR nonreactive. Surgical MRSA PCR screen negative.  Assessment & Plan: Unwitnessed  fall at home Displaced right femoral neck fracture likely due to the above S/p right total hip arthroplasty on 09/26/2021 by Dr. Lyla Glassing orthopedics VTE ppx, postop pain meds per orthopedics PT/OT-recommend SNF   Acute blood loss anemia: Some element of hemodilution from IV fluid as well. Recent Labs    01/11/21 1210 09/25/21 0225 09/26/21 0229 09/27/21 0434 09/28/21 0437 09/28/21 0943  HGB 13.7 13.6 13.2 10.0* 9.0* 8.8*  -Monitor H&H -Check anemia panel in the morning -Discontinue IV fluid   Acute hospital delirium on chronic cognitive impairment: Per patient's son, no formal diagnosis of dementia but intermittent confusion with visual hallucination at times. -Delirium precautions -She was started on low-dose Xanax as needed for anxiety   Hyponatremia: Likely from dehydration.  Resolved.    Essential hypertension: Normotensive for most part. -Continue lisinopril (adjust prn), also on prn hydralazine   Glaucoma -Continue home gtt's   Hypothyroidism: TSH 2.0. -Continue synthroid 147mcg   Osteopenia -Hold home boniva while admitted   Leukocytosis: Likely demargination from the above.  Resolved.  Goal of care counseling: Advanced age with significant cognitive impairment.  Still full code.  Discussed about CODE STATUS including pros and cons of CPR and intubation with patient's son, [name] at bedside.  I recommended DNR/DNI but [name] would like to discuss this with his brother before making decision. -May need palliative follow-up at SNF.  Body mass index is 27.08 kg/m.  DVT prophylaxis:  SCDs Start: 09/26/21 1601 On full dose aspirin per orthopedic surgery.  Code Status: Full code Family Communication: Updated patient's son at bedside. Level of care: Telemetry Status is: Inpatient  Remains inpatient appropriate because: Safe disposition/SNF   Final disposition: SNF likely on 09/29/2021.   Consultants:  Orthopedic surgery   Sch Meds:   Scheduled Meds:  acetaminophen  1,000 mg Oral Q8H   aspirin EC  325 mg Oral Q breakfast   docusate sodium  100 mg Oral BID   dorzolamide  1 drop Right Eye BID   latanoprost  1 drop Both Eyes QHS   levothyroxine  100 mcg Oral QAC breakfast   lisinopril  20 mg Oral Daily   senna  1 tablet Oral BID   Continuous Infusions: PRN Meds:.ALPRAZolam, hydrALAZINE, menthol-cetylpyridinium **OR** phenol, methocarbamol **OR** [DISCONTINUED] methocarbamol (ROBAXIN) IV, metoCLOPramide **OR** metoCLOPramide (REGLAN) injection, ondansetron **OR** ondansetron (ZOFRAN) IV, oxyCODONE, polyethylene glycol, sodium phosphate, sorbitol  Antimicrobials: Anti-infectives (From admission, onward)    Start     Dose/Rate Route Frequency Ordered Stop   09/26/21 2000  ceFAZolin (ANCEF) IVPB 2g/100 mL premix        2 g 200 mL/hr over 30 Minutes Intravenous Every 6 hours 09/26/21 1600 09/27/21 0323   09/26/21 1545  ceFAZolin (ANCEF) IVPB 2g/100 mL premix        2 g 200 mL/hr over 30 Minutes Intravenous On call to O.R. 09/26/21 0258 09/26/21 1335        I have personally reviewed the following labs and images: CBC: Recent Labs  Lab 09/25/21 0225 09/26/21 0229 09/27/21 0434 09/28/21 0437 09/28/21 0943  WBC 10.3 11.0* 12.3* 10.1  --   NEUTROABS 8.6*  --   --   --   --   HGB 13.6 13.2 10.0* 9.0* 8.8*  HCT 42.1 39.4 30.1* 27.2* 26.5*  MCV 90.0 90.6 89.1 90.7  --   PLT 207 184 159 151  --    BMP &GFR Recent Labs  Lab 09/25/21 0225 09/26/21 0229 09/27/21 0434 09/28/21 0437  NA 133* 133* 133* 136  K 3.8 3.6 3.6 3.7  CL 102 101 104 105  CO2 21* 22 24 24   GLUCOSE 133* 104* 122* 104*  BUN 11 9 18 12   CREATININE 0.52 0.43* 0.59 0.40*  CALCIUM 9.0 8.7* 7.7* 8.3*   Estimated Creatinine Clearance: 42 mL/min (A) (by C-G formula based on SCr of 0.4 mg/dL (L)). Liver & Pancreas: Recent Labs  Lab 09/25/21 0225  AST 22  ALT 21  ALKPHOS 67  BILITOT 0.6  PROT 7.3  ALBUMIN 3.7   No results for  input(s): LIPASE, AMYLASE in the last 168 hours. No results for input(s): AMMONIA in the last 168 hours. Diabetic: Recent Labs    09/27/21 0434  HGBA1C 5.6   No results for input(s): GLUCAP in the last 168 hours. Cardiac Enzymes: No results for input(s): CKTOTAL, CKMB, CKMBINDEX, TROPONINI in the last 168 hours. No results for input(s): PROBNP in the last 8760 hours. Coagulation Profile: No results for input(s): INR, PROTIME in the last 168 hours. Thyroid Function Tests: Recent Labs    09/26/21 0229  TSH 2.046   Lipid Profile: No results for input(s): CHOL, HDL, LDLCALC, TRIG, CHOLHDL, LDLDIRECT in the last 72 hours. Anemia Panel: No results for input(s): VITAMINB12, FOLATE, FERRITIN, TIBC, IRON, RETICCTPCT in the last 72 hours. Urine analysis:    Component Value Date/Time   COLORURINE YELLOW 09/25/2021 0330   APPEARANCEUR CLOUDY (A) 09/25/2021 0330  LABSPEC 1.025 09/25/2021 0330   PHURINE 7.0 09/25/2021 0330   GLUCOSEU NEGATIVE 09/25/2021 0330   HGBUR TRACE (A) 09/25/2021 0330   BILIRUBINUR NEGATIVE 09/25/2021 0330   KETONESUR NEGATIVE 09/25/2021 0330   PROTEINUR NEGATIVE 09/25/2021 0330   NITRITE NEGATIVE 09/25/2021 0330   LEUKOCYTESUR TRACE (A) 09/25/2021 0330   Sepsis Labs: Invalid input(s): PROCALCITONIN, [place]  Microbiology: Recent Results (from the past 240 hour(s))  Resp Panel by RT-PCR (Flu A&B, Covid) Nasopharyngeal Swab     Status: None   Collection Time: 09/25/21  2:07 AM   Specimen: Nasopharyngeal Swab; Nasopharyngeal(NP) swabs in vial transport medium  Result Value Ref Range Status   SARS Coronavirus 2 by RT PCR NEGATIVE NEGATIVE Final    Comment: (NOTE) SARS-CoV-2 target nucleic acids are NOT DETECTED.  The SARS-CoV-2 RNA is generally detectable in upper respiratory specimens during the acute phase of infection. The lowest concentration of SARS-CoV-2 viral copies this assay can detect is 138 copies/mL. A negative result does not preclude  SARS-Cov-2 infection and should not be used as the sole basis for treatment or other patient management decisions. A negative result may occur with  improper specimen collection/handling, submission of specimen other than nasopharyngeal swab, presence of viral mutation(s) within the areas targeted by this assay, and inadequate number of viral copies(<138 copies/mL). A negative result must be combined with clinical observations, patient history, and epidemiological information. The expected result is Negative.  Fact Sheet for Patients:  EntrepreneurPulse.com.au  Fact Sheet for Healthcare Providers:  IncredibleEmployment.be  This test is no t yet approved or cleared by the [place] FDA and  has been authorized for detection and/or diagnosis of SARS-CoV-2 by FDA under an Emergency Use Authorization (EUA). This EUA will remain  in effect (meaning this test can be used) for the duration of the COVID-19 declaration under Section 564(b)(1) of the Act, 21 U.S.C.section 360bbb-3(b)(1), unless the authorization is terminated  or revoked sooner.       Influenza A by PCR NEGATIVE NEGATIVE Final   Influenza B by PCR NEGATIVE NEGATIVE Final    Comment: (NOTE) The Xpert Xpress SARS-CoV-2/FLU/RSV plus assay is intended as an aid in the diagnosis of influenza from Nasopharyngeal swab specimens and should not be used as a sole basis for treatment. Nasal washings and aspirates are unacceptable for Xpert Xpress SARS-CoV-2/FLU/RSV testing.  Fact Sheet for Patients: EntrepreneurPulse.com.au  Fact Sheet for Healthcare Providers: IncredibleEmployment.be  This test is not yet approved or cleared by the [place] FDA and has been authorized for detection and/or diagnosis of SARS-CoV-2 by FDA under an Emergency Use Authorization (EUA). This EUA will remain in effect (meaning this test can be used) for the duration of  the COVID-19 declaration under Section 564(b)(1) of the Act, 21 U.S.C. section 360bbb-3(b)(1), unless the authorization is terminated or revoked.  Performed at [facility], [place] [address], [place], [place] [address]   Surgical pcr screen     Status: None   Collection Time: 09/26/21  3:01 AM   Specimen: Nasal Mucosa; Nasal Swab  Result Value Ref Range Status   MRSA, PCR NEGATIVE NEGATIVE Final   Staphylococcus aureus NEGATIVE NEGATIVE Final    Comment: (NOTE) The Xpert SA Assay (FDA approved for NASAL specimens in patients 36 years of age and older), is one component of a comprehensive surveillance program. It is not intended to diagnose infection nor to guide or monitor treatment. Performed at [facility], [place] [address] [place],  [address]  Radiology Studies: No results found.    Jimmie Rueter T. [place]  If 7PM-7AM, please contact night-coverage [email or web address] 09/28/2021, 3:07 PM

## 2021-09-29 DIAGNOSIS — D62 Acute posthemorrhagic anemia: Secondary | ICD-10-CM | POA: Diagnosis not present

## 2021-09-29 DIAGNOSIS — L8990 Pressure ulcer of unspecified site, unspecified stage: Secondary | ICD-10-CM | POA: Insufficient documentation

## 2021-09-29 DIAGNOSIS — I10 Essential (primary) hypertension: Secondary | ICD-10-CM | POA: Diagnosis not present

## 2021-09-29 DIAGNOSIS — S72001A Fracture of unspecified part of neck of right femur, initial encounter for closed fracture: Secondary | ICD-10-CM | POA: Diagnosis not present

## 2021-09-29 DIAGNOSIS — E039 Hypothyroidism, unspecified: Secondary | ICD-10-CM | POA: Diagnosis not present

## 2021-09-29 LAB — CBC
HCT: 26 % — ABNORMAL LOW (ref 36.0–46.0)
Hemoglobin: 8.6 g/dL — ABNORMAL LOW (ref 12.0–15.0)
MCH: 30 pg (ref 26.0–34.0)
MCHC: 33.1 g/dL (ref 30.0–36.0)
MCV: 90.6 fL (ref 80.0–100.0)
Platelets: 161 10*3/uL (ref 150–400)
RBC: 2.87 MIL/uL — ABNORMAL LOW (ref 3.87–5.11)
RDW: 13.2 % (ref 11.5–15.5)
WBC: 9.3 10*3/uL (ref 4.0–10.5)
nRBC: 0 % (ref 0.0–0.2)

## 2021-09-29 LAB — RESP PANEL BY RT-PCR (FLU A&B, COVID) ARPGX2
Influenza A by PCR: NEGATIVE
Influenza B by PCR: NEGATIVE
SARS Coronavirus 2 by RT PCR: NEGATIVE

## 2021-09-29 MED ORDER — ASPIRIN EC 81 MG PO TBEC
81.0000 mg | DELAYED_RELEASE_TABLET | Freq: Every day | ORAL | Status: DC
  Administered 2021-09-30: 09:00:00 81 mg via ORAL
  Filled 2021-09-29: qty 1

## 2021-09-29 MED ORDER — SENNA 8.6 MG PO TABS: 1.0000 | ORAL_TABLET | Freq: Two times a day (BID) | ORAL | 0 refills | Status: DC

## 2021-09-29 MED ORDER — ACETAMINOPHEN 325 MG PO TABS: 650.0000 mg | ORAL_TABLET | Freq: Four times a day (QID) | ORAL | 2 refills | Status: DC | PRN

## 2021-09-29 MED ORDER — ONDANSETRON HCL 4 MG PO TABS: 4.0000 mg | ORAL_TABLET | Freq: Four times a day (QID) | ORAL | 0 refills | Status: DC | PRN

## 2021-09-29 MED ORDER — ORAL CARE MOUTH RINSE
15.0000 mL | Freq: Two times a day (BID) | OROMUCOSAL | Status: DC
  Administered 2021-09-29 – 2021-09-30 (×3): 15 mL via OROMUCOSAL

## 2021-09-29 MED ORDER — POLYETHYLENE GLYCOL 3350 17 G PO PACK: 17.0000 g | PACK | Freq: Two times a day (BID) | ORAL | 0 refills | Status: DC | PRN

## 2021-09-29 NOTE — Plan of Care (Signed)
°  Problem: Coping: Goal: Level of anxiety will decrease Outcome: Progressing   Problem: Elimination: Goal: Will not experience complications related to urinary retention Outcome: Progressing   Problem: Pain Managment: Goal: General experience of comfort will improve Outcome: Progressing   Problem: Safety: Goal: Ability to remain free from injury will improve Outcome: Progressing   Problem: Skin Integrity: Goal: Risk for impaired skin integrity will decrease Outcome: Progressing   Problem: Pain Management: Goal: Pain level will decrease Outcome: Progressing

## 2021-09-29 NOTE — Progress Notes (Signed)
09/29/2021 at 0043: Patient's Respiratory Panel was obtained and sent down to lab.  By error, nurse accidentally entered that the Respiratory panel was collected on 09/28/2021 at 0043.

## 2021-09-29 NOTE — Plan of Care (Signed)
  Problem: Safety: Goal: Ability to remain free from injury will improve Outcome: Progressing   

## 2021-09-29 NOTE — TOC Progression Note (Addendum)
Transition of Care [facility]) - Progression Note    Patient Details  Name: Linda Copeland MRN: [id number] Date of Birth: 09-28-1931  Transition of Care [facility]) CM/SW Contact  Aerilynn Goin, Juliann Pulse, RN Phone Number: 09/29/2021, 9:40 AM  Clinical Narrative: [name] son chose [facility]-left vm w/[facility] on initiating auth.Received pasrr.  10a-Patient not not managed by health-left vm w/[facility] rep Tressa Busman to initiate auth. They can accept once auth received. 11:27a-[facility] rep clive-aware of palliative care services to follow otpt.  Expected Discharge Plan: Skilled Nursing Facility Barriers to Discharge: Insurance Authorization  Expected Discharge Plan and Services Expected Discharge Plan: [place]   Discharge Planning Services: CM Consult   Living arrangements for the past 2 months: Single Family Home Expected Discharge Date: 09/29/21                                     Social Determinants of Health (SDOH) Interventions    Readmission Risk Interventions No flowsheet data found.

## 2021-09-29 NOTE — Discharge Summary (Signed)
Physician Discharge Summary  Linda Copeland SFK:[id number] DOB: [birth year] DOA: 09/25/2021  PCP: Gaynelle Arabian, MD  Admit date: 09/25/2021 Discharge date: 09/29/2021 Admitted From: Home Disposition: SNF Recommendations for Outpatient Follow-up:  Follow ups as below. Please obtain CBC/BMP/Mag at follow up Recommend palliative follow-up at SNF Please follow up on the following pending results: None  Discharge Condition: Stable CODE STATUS: Full code  Follow-up Information     Swinteck, Aaron Edelman, MD. Schedule an appointment as soon as possible for a visit in 2 week(s).   Specialty: Orthopedic Surgery Why: For suture removal Contact information: [address] [place] [address] [phone]                Hospital Course: 87 y.o. female with a history of recent memory decline/suspected dementia, HTN, osteopenia with vertebral compression fractures, severe glaucoma, hypothyroidism who presented to the ED by EMS after an unwitnessed fall at home 12/24 with resultant right hip pain, confirmed to have displaced, impacted right femoral neck fracture s/p surgical repair on 09/26/21.  She is discharged on low-dose aspirin for VTEprophylaxis, and as needed Tylenol and Norco for pain control.  Outpatient follow-up with orthopedic surgery in 2 weeks as above.  Hospital course complicated by confusion and delirium that seems to have improved.  See individual problem list below for more on hospital course.  Discharge Diagnoses:  Unwitnessed fall at home Displaced right femoral neck fracture likely due to the above S/p right total hip arthroplasty on 09/26/2021 by Dr. Lyla Glassing orthopedics -Low-dose aspirin for VTE prophylaxis -Tylenol and Norco as needed for pain control -Bowel regimen -Continue PT/OT at SNF.     Acute blood loss anemia: Some element of hemodilution from IV fluid as well.  H&H is stable after initial drop Recent Labs    01/11/21 1210  09/25/21 0225 09/26/21 0229 09/27/21 0434 09/28/21 0437 09/28/21 0943 09/29/21 0438  HGB 13.7 13.6 13.2 10.0* 9.0* 8.8* 8.6*  -Decreased aspirin to 81 mg daily -Recheck CBC at follow-up.   Acute hospital delirium on chronic cognitive impairment: Per patient's son, no formal diagnosis of dementia but intermittent confusion with visual hallucination at times. -Reorientation delirium precautions -Palliative follow-up at SNF.   Hyponatremia: Likely from dehydration.  Resolved.   Essential hypertension: Normotensive for most part. -Continue lisinopril 20 mg daily   Glaucoma -Continue home gtt's   Hypothyroidism: TSH 2.0. -Continue synthroid 178mcg   Osteopenia -Continue home Boniva   Leukocytosis: Likely demargination from the above.  Resolved.   Goal of care counseling: Advanced age with significant cognitive impairment.  Still full code.  Discussed about CODE STATUS including pros and cons of CPR and intubation with patient's son, [name] at bedside.  I recommended DNR/DNI but [name] would like to discuss this with his brother before making decision. -Palliative follow-up at SNF.  Body mass index is 27.08 kg/m.           Discharge Exam: Vitals:   09/28/21 0410 09/28/21 1349 09/28/21 2106 09/29/21 0632  BP: 130/70 (!) 148/74 139/84 132/65  Pulse: 80 95 87 84  Temp: 98 F (36.7 C) 98.2 F (36.8 C) 98.2 F (36.8 C) 98.3 F (36.8 C)  Resp: 18 18 20 18   Height:      Weight:      SpO2: 95% 90% 93% 98%  TempSrc: Axillary Oral Oral Oral  BMI (Calculated):         GENERAL: Frail looking elderly female.  Nontoxic. HEENT: MMM.  Vision and hearing grossly  intact.  NECK: Supple.  No apparent JVD.  RESP: 98% on RA.  No IWOB.  Fair aeration bilaterally. CVS:  RRR. Heart sounds normal.  ABD/GI/GU: Bowel sounds present. Soft. Non tender.  MSK/EXT:  Moves extremities. No apparent deformity. No edema.  SKIN: Dressing over surgical site DCI. NEURO: Awake and alert.  Oriented  to self.  No apparent focal neuro deficit. PSYCH: Calm. Normal affect.   Discharge Instructions  Discharge Instructions     Diet general   Complete by: As directed    Increase activity slowly   Complete by: As directed    No wound care   Complete by: As directed       Allergies as of 09/29/2021       Reactions   Codeine Nausea And Vomiting, Other (See Comments)   uknown   Biaxin [clarithromycin] Other (See Comments)   Sweats   Caffeine    Shaky        Medication List     STOP taking these medications    meloxicam 7.5 MG tablet Commonly known as: MOBIC       TAKE these medications    acetaminophen 325 MG tablet Commonly known as: Tylenol Take 2 tablets (650 mg total) by mouth every 6 (six) hours as needed for mild pain, fever or headache.   aspirin 81 MG chewable tablet Commonly known as: Aspirin Childrens Chew 1 tablet (81 mg total) by mouth 2 (two) times daily.   dorzolamide 2 % ophthalmic solution Commonly known as: TRUSOPT Place 1 drop into the right eye 2 (two) times daily.   HYDROcodone-acetaminophen 5-325 MG tablet Commonly known as: NORCO/VICODIN Take 1-2 tablets by mouth every 4 (four) hours as needed for moderate pain (pain score 4-6).   ibandronate 150 MG tablet Commonly known as: BONIVA Take 150 mg by mouth every 30 (thirty) days.   levothyroxine 100 MCG tablet Commonly known as: SYNTHROID Take 100 mcg by mouth daily before breakfast.   lisinopril 20 MG tablet Commonly known as: ZESTRIL Take 20 mg by mouth daily.   Lumigan 0.01 % Soln Generic drug: bimatoprost Place 1 drop into both eyes at bedtime.   ondansetron 4 MG tablet Commonly known as: ZOFRAN Take 1 tablet (4 mg total) by mouth every 6 (six) hours as needed for nausea.   polyethylene glycol 17 g packet Commonly known as: MIRALAX / GLYCOLAX Take 17 g by mouth 2 (two) times daily as needed for mild constipation.   senna 8.6 MG Tabs tablet Commonly known as:  SENOKOT Take 1 tablet (8.6 mg total) by mouth 2 (two) times daily.        Consultations: Orthopedic surgery  Procedures/Studies: 09/26/2021-Right total hip arthroplasty, anterior approach   DG Chest 1 View  Result Date: 09/25/2021 CLINICAL DATA:  Fall and confusion EXAM: CHEST  1 VIEW COMPARISON:  07/09/2019 FINDINGS: The heart size and mediastinal contours are within normal limits. Both lungs are clear. The visualized skeletal structures are unremarkable. IMPRESSION: No active disease. Electronically Signed   By: Ulyses Jarred M.D.   On: 09/25/2021 03:03   CT Head Wo Contrast  Result Date: 09/25/2021 CLINICAL DATA:  Recent fall several hours ago with headaches and neck pain, initial encounter EXAM: CT HEAD WITHOUT CONTRAST CT CERVICAL SPINE WITHOUT CONTRAST TECHNIQUE: Multidetector CT imaging of the head and cervical spine was performed following the standard protocol without intravenous contrast. Multiplanar CT image reconstructions of the cervical spine were also generated. COMPARISON:  None. FINDINGS: CT HEAD FINDINGS  Brain: No evidence of acute infarction, hemorrhage, hydrocephalus, extra-axial collection or mass lesion/mass effect. Chronic atrophic and ischemic changes are noted. Vascular: No hyperdense vessel or unexpected calcification. Skull: Normal. Negative for fracture or focal lesion. Sinuses/Orbits: No acute finding. Other: None. CT CERVICAL SPINE FINDINGS Alignment: Mild degenerative anterolisthesis of C7 on T1 is noted. Skull base and vertebrae: 7 cervical segments are well visualized. Vertebral body height is well maintained. Irregularity along the superior aspect of the C6 vertebral body is noted with associated osteophytic changes consistent with degenerative change. No compression deformity is noted. Facet hypertrophic changes are noted. No acute fracture or acute facet abnormality is noted. Soft tissues and spinal canal: Surrounding soft tissue structures are within normal  limits. Upper chest: Visualized lung apices demonstrate a small 3-4 mm nodule in the right upper lobe best seen on image number 72 of series 6. Other: None IMPRESSION: CT of the head: Chronic atrophic and ischemic changes. CT of cervical spine: Multilevel degenerative change. 3-4 mm nodule in the right upper lobe. No follow-up needed if patient is low-risk. Non-contrast chest CT can be considered in 12 months if patient is high-risk. This recommendation follows the consensus statement: Guidelines for Management of Incidental Pulmonary Nodules Detected on CT Images: From the Fleischner Society 2017; Radiology 2017; 284:228-243. Electronically Signed   By: Inez Catalina M.D.   On: 09/25/2021 03:19   CT Cervical Spine Wo Contrast  Result Date: 09/25/2021 CLINICAL DATA:  Recent fall several hours ago with headaches and neck pain, initial encounter EXAM: CT HEAD WITHOUT CONTRAST CT CERVICAL SPINE WITHOUT CONTRAST TECHNIQUE: Multidetector CT imaging of the head and cervical spine was performed following the standard protocol without intravenous contrast. Multiplanar CT image reconstructions of the cervical spine were also generated. COMPARISON:  None. FINDINGS: CT HEAD FINDINGS Brain: No evidence of acute infarction, hemorrhage, hydrocephalus, extra-axial collection or mass lesion/mass effect. Chronic atrophic and ischemic changes are noted. Vascular: No hyperdense vessel or unexpected calcification. Skull: Normal. Negative for fracture or focal lesion. Sinuses/Orbits: No acute finding. Other: None. CT CERVICAL SPINE FINDINGS Alignment: Mild degenerative anterolisthesis of C7 on T1 is noted. Skull base and vertebrae: 7 cervical segments are well visualized. Vertebral body height is well maintained. Irregularity along the superior aspect of the C6 vertebral body is noted with associated osteophytic changes consistent with degenerative change. No compression deformity is noted. Facet hypertrophic changes are noted. No  acute fracture or acute facet abnormality is noted. Soft tissues and spinal canal: Surrounding soft tissue structures are within normal limits. Upper chest: Visualized lung apices demonstrate a small 3-4 mm nodule in the right upper lobe best seen on image number 72 of series 6. Other: None IMPRESSION: CT of the head: Chronic atrophic and ischemic changes. CT of cervical spine: Multilevel degenerative change. 3-4 mm nodule in the right upper lobe. No follow-up needed if patient is low-risk. Non-contrast chest CT can be considered in 12 months if patient is high-risk. This recommendation follows the consensus statement: Guidelines for Management of Incidental Pulmonary Nodules Detected on CT Images: From the Fleischner Society 2017; Radiology 2017; 284:228-243. Electronically Signed   By: Inez Catalina M.D.   On: 09/25/2021 03:19   Pelvis Portable  Result Date: 09/26/2021 CLINICAL DATA:  Right hip replacement EXAM: PORTABLE PELVIS 1-2 VIEWS COMPARISON:  None. FINDINGS: Changes of right hip replacement. Normal AP alignment. No hardware bony complicating feature. IMPRESSION: Right hip replacement.  No visible complicating feature. Electronically Signed   By: Rolm Baptise M.D.  On: 09/26/2021 15:42   DG Knee Right Port  Result Date: 09/25/2021 CLINICAL DATA:  Recent fall with known right hip fracture, initial encounter EXAM: PORTABLE RIGHT KNEE - 2 VIEW COMPARISON:  None. FINDINGS: No acute fracture or dislocation is noted. Mild degenerative changes are noted. No joint effusion is seen. IMPRESSION: Mild degenerative change without acute abnormality. Electronically Signed   By: Inez Catalina M.D.   On: 09/25/2021 22:10   DG C-Arm 1-60 Min-No Report  Result Date: 09/26/2021 Fluoroscopy was utilized by the requesting physician.  No radiographic interpretation.   DG C-Arm 1-60 Min-No Report  Result Date: 09/26/2021 Fluoroscopy was utilized by the requesting physician.  No radiographic interpretation.    DG HIP OPERATIVE UNILAT W OR W/O PELVIS RIGHT  Result Date: 09/26/2021 CLINICAL DATA:  Right hip arthroplasty EXAM: OPERATIVE RIGHT HIP (WITH PELVIS IF PERFORMED)  VIEWS TECHNIQUE: Fluoroscopic spot image(s) were submitted for interpretation post-operatively. COMPARISON:  None. FLUOROSCOPY TIME:  00:06 FINDINGS: Intraoperative fluoroscopic images of the right hip demonstrate total hip arthroplasty. No obvious perihardware fracture or component malpositioning. IMPRESSION: Intraoperative fluoroscopic images of the right hip demonstrate total hip arthroplasty. No obvious perihardware fracture or component malpositioning. Electronically Signed   By: Delanna Ahmadi M.D.   On: 09/26/2021 15:08   DG Hip Unilat W or Wo Pelvis 2-3 Views Right  Result Date: 09/25/2021 CLINICAL DATA:  Fall and right hip pain. EXAM: DG HIP (WITH OR WITHOUT PELVIS) 2-3V RIGHT COMPARISON:  Right hip radiograph dated 01/11/2021. FINDINGS: There is a fracture of the right femoral neck with mild proximal migration and impaction of the femoral shaft. No dislocation. The bones are osteopenic. The soft tissues are unremarkable. Vascular calcifications noted. IMPRESSION: Mildly displaced and impacted right femoral neck fracture. Electronically Signed   By: Anner Crete M.D.   On: 09/25/2021 02:55       The results of significant diagnostics from this hospitalization (including imaging, microbiology, ancillary and laboratory) are listed below for reference.     Microbiology: Recent Results (from the past 240 hour(s))  Resp Panel by RT-PCR (Flu A&B, Covid) Nasopharyngeal Swab     Status: None   Collection Time: 09/25/21  2:07 AM   Specimen: Nasopharyngeal Swab; Nasopharyngeal(NP) swabs in vial transport medium  Result Value Ref Range Status   SARS Coronavirus 2 by RT PCR NEGATIVE NEGATIVE Final    Comment: (NOTE) SARS-CoV-2 target nucleic acids are NOT DETECTED.  The SARS-CoV-2 RNA is generally detectable in upper  respiratory specimens during the acute phase of infection. The lowest concentration of SARS-CoV-2 viral copies this assay can detect is 138 copies/mL. A negative result does not preclude SARS-Cov-2 infection and should not be used as the sole basis for treatment or other patient management decisions. A negative result may occur with  improper specimen collection/handling, submission of specimen other than nasopharyngeal swab, presence of viral mutation(s) within the areas targeted by this assay, and inadequate number of viral copies(<138 copies/mL). A negative result must be combined with clinical observations, patient history, and epidemiological information. The expected result is Negative.  Fact Sheet for Patients:  EntrepreneurPulse.com.au  Fact Sheet for Healthcare Providers:  IncredibleEmployment.be  This test is no t yet approved or cleared by the [place] FDA and  has been authorized for detection and/or diagnosis of SARS-CoV-2 by FDA under an Emergency Use Authorization (EUA). This EUA will remain  in effect (meaning this test can be used) for the duration of the COVID-19 declaration under Section 564(b)(1) of the  Act, 21 U.S.C.section 360bbb-3(b)(1), unless the authorization is terminated  or revoked sooner.       Influenza A by PCR NEGATIVE NEGATIVE Final   Influenza B by PCR NEGATIVE NEGATIVE Final    Comment: (NOTE) The Xpert Xpress SARS-CoV-2/FLU/RSV plus assay is intended as an aid in the diagnosis of influenza from Nasopharyngeal swab specimens and should not be used as a sole basis for treatment. Nasal washings and aspirates are unacceptable for Xpert Xpress SARS-CoV-2/FLU/RSV testing.  Fact Sheet for Patients: EntrepreneurPulse.com.au  Fact Sheet for Healthcare Providers: IncredibleEmployment.be  This test is not yet approved or cleared by the [place] FDA and has been  authorized for detection and/or diagnosis of SARS-CoV-2 by FDA under an Emergency Use Authorization (EUA). This EUA will remain in effect (meaning this test can be used) for the duration of the COVID-19 declaration under Section 564(b)(1) of the Act, 21 U.S.C. section 360bbb-3(b)(1), unless the authorization is terminated or revoked.  Performed at [facility], [place] [address] [place], [place] [address]   Surgical pcr screen     Status: None   Collection Time: 09/26/21  3:01 AM   Specimen: Nasal Mucosa; Nasal Swab  Result Value Ref Range Status   MRSA, PCR NEGATIVE NEGATIVE Final   Staphylococcus aureus NEGATIVE NEGATIVE Final    Comment: (NOTE) The Xpert SA Assay (FDA approved for NASAL specimens in patients 44 years of age and older), is one component of a comprehensive surveillance program. It is not intended to diagnose infection nor to guide or monitor treatment. Performed at [facility], [place] [address], [place], [place] [address]   Resp Panel by RT-PCR (Flu A&B, Covid) Nasopharyngeal Swab     Status: None   Collection Time: 09/28/21 12:43 AM   Specimen: Nasopharyngeal Swab; Nasopharyngeal(NP) swabs in vial transport medium  Result Value Ref Range Status   SARS Coronavirus 2 by RT PCR NEGATIVE NEGATIVE Final    Comment: (NOTE) SARS-CoV-2 target nucleic acids are NOT DETECTED.  The SARS-CoV-2 RNA is generally detectable in upper respiratory specimens during the acute phase of infection. The lowest concentration of SARS-CoV-2 viral copies this assay can detect is 138 copies/mL. A negative result does not preclude SARS-Cov-2 infection and should not be used as the sole basis for treatment or other patient management decisions. A negative result may occur with  improper specimen collection/handling, submission of specimen other than nasopharyngeal swab, presence of viral mutation(s) within the areas targeted by this assay, and inadequate number of  viral copies(<138 copies/mL). A negative result must be combined with clinical observations, patient history, and epidemiological information. The expected result is Negative.  Fact Sheet for Patients:  EntrepreneurPulse.com.au  Fact Sheet for Healthcare Providers:  IncredibleEmployment.be  This test is no t yet approved or cleared by the [place] FDA and  has been authorized for detection and/or diagnosis of SARS-CoV-2 by FDA under an Emergency Use Authorization (EUA). This EUA will remain  in effect (meaning this test can be used) for the duration of the COVID-19 declaration under Section 564(b)(1) of the Act, 21 U.S.C.section 360bbb-3(b)(1), unless the authorization is terminated  or revoked sooner.       Influenza A by PCR NEGATIVE NEGATIVE Final   Influenza B by PCR NEGATIVE NEGATIVE Final    Comment: (NOTE) The Xpert Xpress SARS-CoV-2/FLU/RSV plus assay is intended as an aid in the diagnosis of influenza from Nasopharyngeal swab specimens and should not be used as a sole basis for treatment. Nasal  washings and aspirates are unacceptable for Xpert Xpress SARS-CoV-2/FLU/RSV testing.  Fact Sheet for Patients: EntrepreneurPulse.com.au  Fact Sheet for Healthcare Providers: IncredibleEmployment.be  This test is not yet approved or cleared by the [place] FDA and has been authorized for detection and/or diagnosis of SARS-CoV-2 by FDA under an Emergency Use Authorization (EUA). This EUA will remain in effect (meaning this test can be used) for the duration of the COVID-19 declaration under Section 564(b)(1) of the Act, 21 U.S.C. section 360bbb-3(b)(1), unless the authorization is terminated or revoked.  Performed at [facility], [place] [address], [place], [place] [address]      Labs:  CBC: Recent Labs  Lab 09/25/21 0225 09/26/21 0229 09/27/21 0434 09/28/21 0437  09/28/21 0943 09/29/21 0438  WBC 10.3 11.0* 12.3* 10.1  --  9.3  NEUTROABS 8.6*  --   --   --   --   --   HGB 13.6 13.2 10.0* 9.0* 8.8* 8.6*  HCT 42.1 39.4 30.1* 27.2* 26.5* 26.0*  MCV 90.0 90.6 89.1 90.7  --  90.6  PLT 207 184 159 151  --  161   BMP &GFR Recent Labs  Lab 09/25/21 0225 09/26/21 0229 09/27/21 0434 09/28/21 0437  NA 133* 133* 133* 136  K 3.8 3.6 3.6 3.7  CL 102 101 104 105  CO2 21* 22 24 24   GLUCOSE 133* 104* 122* 104*  BUN 11 9 18 12   CREATININE 0.52 0.43* 0.59 0.40*  CALCIUM 9.0 8.7* 7.7* 8.3*   Estimated Creatinine Clearance: 42 mL/min (A) (by C-G formula based on SCr of 0.4 mg/dL (L)). Liver & Pancreas: Recent Labs  Lab 09/25/21 0225  AST 22  ALT 21  ALKPHOS 67  BILITOT 0.6  PROT 7.3  ALBUMIN 3.7   No results for input(s): LIPASE, AMYLASE in the last 168 hours. No results for input(s): AMMONIA in the last 168 hours. Diabetic: Recent Labs    09/27/21 0434  HGBA1C 5.6   No results for input(s): GLUCAP in the last 168 hours. Cardiac Enzymes: No results for input(s): CKTOTAL, CKMB, CKMBINDEX, TROPONINI in the last 168 hours. No results for input(s): PROBNP in the last 8760 hours. Coagulation Profile: No results for input(s): INR, PROTIME in the last 168 hours. Thyroid Function Tests: No results for input(s): TSH, T4TOTAL, FREET4, T3FREE, THYROIDAB in the last 72 hours. Lipid Profile: No results for input(s): CHOL, HDL, LDLCALC, TRIG, CHOLHDL, LDLDIRECT in the last 72 hours. Anemia Panel: No results for input(s): VITAMINB12, FOLATE, FERRITIN, TIBC, IRON, RETICCTPCT in the last 72 hours. Urine analysis:    Component Value Date/Time   COLORURINE YELLOW 09/25/2021 0330   APPEARANCEUR CLOUDY (A) 09/25/2021 0330   LABSPEC 1.025 09/25/2021 0330   PHURINE 7.0 09/25/2021 0330   GLUCOSEU NEGATIVE 09/25/2021 0330   HGBUR TRACE (A) 09/25/2021 0330   BILIRUBINUR NEGATIVE 09/25/2021 0330   KETONESUR NEGATIVE 09/25/2021 0330   PROTEINUR NEGATIVE  09/25/2021 0330   NITRITE NEGATIVE 09/25/2021 0330   LEUKOCYTESUR TRACE (A) 09/25/2021 0330   Sepsis Labs: Invalid input(s): PROCALCITONIN, LACTICIDVEN   Time coordinating discharge: 45 minutes  SIGNED:  Mercy Riding, MD  [facility] 09/29/2021, 10:46 AM

## 2021-09-29 NOTE — Progress Notes (Signed)
Patient was very anxious at the beginning of the nightshift while yelling out and calling out. Nurse tried to console and reassure patient, but patient was still anxious. Gave PRN 0.25 mg Xanax for anxiety. When it was time to give scheduled nighttime meds, patient was lethargic and was not able to give scheduled meds. Notified on call provider about this and gave the "okay" to hold meds. Patient was less lethargic and more alert with eyes open this morning and was able to take morning meds.

## 2021-09-29 NOTE — Progress Notes (Signed)
Physical Therapy Treatment Patient Details Name: Linda Copeland MRN: [id number] DOB: 1931-03-08 Today's Date: 09/29/2021   History of Present Illness 87 yo female admitted after falling at home-sustained a R hip fx. S/P R THA-DA 09/26/21. Hx of dementia, hearing loss, osteopenia, SDH, R humerus fx 2018, compression fxs, cervical radiculopathy    PT Comments    The patient is pleasantly confused. Asking frequently to hold her hand.Patient  oriented to self.  Total assistance for bed mobility and sitting up on bed edge. Patient able to sit x 8 minutes with close min guard. Unable to stand, pt. Has sever kyphosis which does impede postural position to stand. Patient does not indicate any rt. Hip pain when mobilizing.   Continue mobility efforts.  Recommendations for follow up therapy are one component of a multi-disciplinary discharge planning process, led by the attending physician.  Recommendations may be updated based on patient status, additional functional criteria and insurance authorization.  Follow Up Recommendations  Skilled nursing-short term rehab (<3 hours/day)     Assistance Recommended at Discharge Frequent or constant Supervision/Assistance  Equipment Recommendations  None recommended by PT    Recommendations for Other Services       Precautions / Restrictions Precautions Precautions: Fall Restrictions Weight Bearing Restrictions: No RLE Weight Bearing: Weight bearing as tolerated     Mobility  Bed Mobility   Bed Mobility: Rolling;Supine to Sit;Sit to Sidelying Rolling: Max assist;+2 for physical assistance;+2 for safety/equipment   Supine to sit: Total assist;HOB elevated   Sit to sidelying: Total assist General bed mobility comments: Multiple cues to reach for rails, assist with legs to roll to L and R sides to change bed  linens. Total assist and use of bed pad to move to bed edge and sit upright. Total assist with legs and trunk to return to side , then  bac.    Transfers                   General transfer comment: Attempted x 3 to stand using bed pad under hips. Patient has severe kyphosis . patient unable to bear weight to power up  with severe forward flexion of trunk.    Ambulation/Gait                   Stairs             Wheelchair Mobility    Modified Rankin (Stroke Patients Only)       Balance Overall balance assessment: Needs assistance;History of Falls Sitting-balance support: Bilateral upper extremity supported;Feet supported Sitting balance-Leahy Scale: Poor Sitting balance - Comments: patient did sit  at midline x 8 minutes with close min guard once seated, assisted  with UE exercises while seated to elevate upper trunk. tends to list to right                                    Cognition Arousal/Alertness: Awake/alert Behavior During Therapy: WFL for tasks assessed/performed Overall Cognitive Status: History of cognitive impairments - at baseline                                 General Comments: patient able to follow some simple directions.        Exercises General Exercises - Lower Extremity Long Arc Quad: AAROM;Both;10 reps;Seated    General Comments  Pertinent Vitals/Pain Pain Assessment: No/denies pain    Home Living                          Prior Function            PT Goals (current goals can now be found in the care plan section) Progress towards PT goals: Progressing toward goals    Frequency    Min 2X/week      PT Plan Current plan remains appropriate;Frequency needs to be updated    Co-evaluation              AM-PAC PT "6 Clicks" Mobility   Outcome Measure  Help needed turning from your back to your side while in a flat bed without using bedrails?: Total Help needed moving from lying on your back to sitting on the side of a flat bed without using bedrails?: Total Help needed moving to and from a  bed to a chair (including a wheelchair)?: Total Help needed standing up from a chair using your arms (e.g., wheelchair or bedside chair)?: Total Help needed to walk in hospital room?: Total Help needed climbing 3-5 steps with a railing? : Total 6 Click Score: 6    End of Session   Activity Tolerance: Patient tolerated treatment well Patient left: in bed;with call bell/phone within reach;with bed alarm set Nurse Communication: Mobility status;Need for lift equipment PT Visit Diagnosis: Muscle weakness (generalized) (M62.81);History of falling (Z91.81);Repeated falls (R29.6);Pain;Difficulty in walking, not elsewhere classified (R26.2)     Time: 9563-8756 PT Time Calculation (min) (ACUTE ONLY): 36 min  Charges:  $Therapeutic Activity: 23-37 mins                     Tresa Endo PT Acute Rehabilitation Services Pager [phone] Office [phone]    Linda Copeland 09/29/2021, 11:44 AM

## 2021-09-30 DIAGNOSIS — F03911 Unspecified dementia, unspecified severity, with agitation: Secondary | ICD-10-CM | POA: Diagnosis not present

## 2021-09-30 DIAGNOSIS — M8580 Other specified disorders of bone density and structure, unspecified site: Secondary | ICD-10-CM | POA: Diagnosis not present

## 2021-09-30 DIAGNOSIS — F0390 Unspecified dementia without behavioral disturbance: Secondary | ICD-10-CM | POA: Diagnosis not present

## 2021-09-30 DIAGNOSIS — R4182 Altered mental status, unspecified: Secondary | ICD-10-CM | POA: Diagnosis not present

## 2021-09-30 DIAGNOSIS — Z7982 Long term (current) use of aspirin: Secondary | ICD-10-CM | POA: Diagnosis not present

## 2021-09-30 DIAGNOSIS — Z7401 Bed confinement status: Secondary | ICD-10-CM | POA: Diagnosis not present

## 2021-09-30 DIAGNOSIS — S72051D Unspecified fracture of head of right femur, subsequent encounter for closed fracture with routine healing: Secondary | ICD-10-CM | POA: Diagnosis not present

## 2021-09-30 DIAGNOSIS — M48061 Spinal stenosis, lumbar region without neurogenic claudication: Secondary | ICD-10-CM | POA: Diagnosis not present

## 2021-09-30 DIAGNOSIS — Z515 Encounter for palliative care: Secondary | ICD-10-CM | POA: Diagnosis not present

## 2021-09-30 DIAGNOSIS — R748 Abnormal levels of other serum enzymes: Secondary | ICD-10-CM | POA: Diagnosis present

## 2021-09-30 DIAGNOSIS — I4510 Unspecified right bundle-branch block: Secondary | ICD-10-CM | POA: Diagnosis not present

## 2021-09-30 DIAGNOSIS — Z79890 Hormone replacement therapy (postmenopausal): Secondary | ICD-10-CM | POA: Diagnosis not present

## 2021-09-30 DIAGNOSIS — W050XXA Fall from non-moving wheelchair, initial encounter: Secondary | ICD-10-CM | POA: Diagnosis present

## 2021-09-30 DIAGNOSIS — E538 Deficiency of other specified B group vitamins: Secondary | ICD-10-CM | POA: Diagnosis not present

## 2021-09-30 DIAGNOSIS — R54 Age-related physical debility: Secondary | ICD-10-CM | POA: Diagnosis not present

## 2021-09-30 DIAGNOSIS — S7291XD Unspecified fracture of right femur, subsequent encounter for closed fracture with routine healing: Secondary | ICD-10-CM | POA: Diagnosis not present

## 2021-09-30 DIAGNOSIS — M9711XA Periprosthetic fracture around internal prosthetic right knee joint, initial encounter: Secondary | ICD-10-CM | POA: Diagnosis not present

## 2021-09-30 DIAGNOSIS — M9701XA Periprosthetic fracture around internal prosthetic right hip joint, initial encounter: Secondary | ICD-10-CM | POA: Diagnosis not present

## 2021-09-30 DIAGNOSIS — D62 Acute posthemorrhagic anemia: Secondary | ICD-10-CM | POA: Diagnosis not present

## 2021-09-30 DIAGNOSIS — F29 Unspecified psychosis not due to a substance or known physiological condition: Secondary | ICD-10-CM | POA: Diagnosis not present

## 2021-09-30 DIAGNOSIS — Z888 Allergy status to other drugs, medicaments and biological substances status: Secondary | ICD-10-CM | POA: Diagnosis not present

## 2021-09-30 DIAGNOSIS — Y92129 Unspecified place in nursing home as the place of occurrence of the external cause: Secondary | ICD-10-CM | POA: Diagnosis not present

## 2021-09-30 DIAGNOSIS — R269 Unspecified abnormalities of gait and mobility: Secondary | ICD-10-CM | POA: Diagnosis present

## 2021-09-30 DIAGNOSIS — S72001A Fracture of unspecified part of neck of right femur, initial encounter for closed fracture: Secondary | ICD-10-CM | POA: Diagnosis not present

## 2021-09-30 DIAGNOSIS — Z881 Allergy status to other antibiotic agents status: Secondary | ICD-10-CM | POA: Diagnosis not present

## 2021-09-30 DIAGNOSIS — S7001XA Contusion of right hip, initial encounter: Secondary | ICD-10-CM | POA: Diagnosis present

## 2021-09-30 DIAGNOSIS — Z96641 Presence of right artificial hip joint: Secondary | ICD-10-CM | POA: Diagnosis not present

## 2021-09-30 DIAGNOSIS — F05 Delirium due to known physiological condition: Secondary | ICD-10-CM | POA: Diagnosis not present

## 2021-09-30 DIAGNOSIS — S7291XA Unspecified fracture of right femur, initial encounter for closed fracture: Secondary | ICD-10-CM | POA: Diagnosis not present

## 2021-09-30 DIAGNOSIS — I119 Hypertensive heart disease without heart failure: Secondary | ICD-10-CM | POA: Diagnosis not present

## 2021-09-30 DIAGNOSIS — Z20822 Contact with and (suspected) exposure to covid-19: Secondary | ICD-10-CM | POA: Diagnosis not present

## 2021-09-30 DIAGNOSIS — R4189 Other symptoms and signs involving cognitive functions and awareness: Secondary | ICD-10-CM | POA: Diagnosis not present

## 2021-09-30 DIAGNOSIS — I959 Hypotension, unspecified: Secondary | ICD-10-CM | POA: Diagnosis not present

## 2021-09-30 DIAGNOSIS — Z471 Aftercare following joint replacement surgery: Secondary | ICD-10-CM | POA: Diagnosis not present

## 2021-09-30 DIAGNOSIS — Z66 Do not resuscitate: Secondary | ICD-10-CM | POA: Diagnosis not present

## 2021-09-30 DIAGNOSIS — R627 Adult failure to thrive: Secondary | ICD-10-CM | POA: Diagnosis present

## 2021-09-30 DIAGNOSIS — H401133 Primary open-angle glaucoma, bilateral, severe stage: Secondary | ICD-10-CM | POA: Diagnosis not present

## 2021-09-30 DIAGNOSIS — R410 Disorientation, unspecified: Secondary | ICD-10-CM | POA: Diagnosis not present

## 2021-09-30 DIAGNOSIS — S32411A Displaced fracture of anterior wall of right acetabulum, initial encounter for closed fracture: Secondary | ICD-10-CM | POA: Diagnosis not present

## 2021-09-30 DIAGNOSIS — Z79899 Other long term (current) drug therapy: Secondary | ICD-10-CM | POA: Diagnosis not present

## 2021-09-30 DIAGNOSIS — S32421A Displaced fracture of posterior wall of right acetabulum, initial encounter for closed fracture: Secondary | ICD-10-CM | POA: Diagnosis not present

## 2021-09-30 DIAGNOSIS — M150 Primary generalized (osteo)arthritis: Secondary | ICD-10-CM | POA: Diagnosis not present

## 2021-09-30 DIAGNOSIS — I10 Essential (primary) hypertension: Secondary | ICD-10-CM | POA: Diagnosis not present

## 2021-09-30 DIAGNOSIS — E039 Hypothyroidism, unspecified: Secondary | ICD-10-CM | POA: Diagnosis not present

## 2021-09-30 DIAGNOSIS — S22000A Wedge compression fracture of unspecified thoracic vertebra, initial encounter for closed fracture: Secondary | ICD-10-CM | POA: Diagnosis not present

## 2021-09-30 DIAGNOSIS — Z7189 Other specified counseling: Secondary | ICD-10-CM | POA: Diagnosis not present

## 2021-09-30 DIAGNOSIS — S72031D Displaced midcervical fracture of right femur, subsequent encounter for closed fracture with routine healing: Secondary | ICD-10-CM | POA: Diagnosis not present

## 2021-09-30 DIAGNOSIS — S32401A Unspecified fracture of right acetabulum, initial encounter for closed fracture: Secondary | ICD-10-CM | POA: Diagnosis not present

## 2021-09-30 DIAGNOSIS — T84020A Dislocation of internal right hip prosthesis, initial encounter: Secondary | ICD-10-CM | POA: Diagnosis not present

## 2021-09-30 DIAGNOSIS — Z885 Allergy status to narcotic agent status: Secondary | ICD-10-CM | POA: Diagnosis not present

## 2021-09-30 DIAGNOSIS — L89152 Pressure ulcer of sacral region, stage 2: Secondary | ICD-10-CM | POA: Diagnosis not present

## 2021-09-30 DIAGNOSIS — H409 Unspecified glaucoma: Secondary | ICD-10-CM | POA: Diagnosis not present

## 2021-09-30 LAB — IRON AND TIBC
Iron: 25 ug/dL — ABNORMAL LOW (ref 28–170)
Saturation Ratios: 15 % (ref 10.4–31.8)
TIBC: 172 ug/dL — ABNORMAL LOW (ref 250–450)
UIBC: 147 ug/dL

## 2021-09-30 LAB — RETICULOCYTES
Immature Retic Fract: 12.3 % (ref 2.3–15.9)
RBC.: 3.15 MIL/uL — ABNORMAL LOW (ref 3.87–5.11)
Retic Count, Absolute: 48.2 10*3/uL (ref 19.0–186.0)
Retic Ct Pct: 1.5 % (ref 0.4–3.1)

## 2021-09-30 LAB — FOLATE: Folate: 7.9 ng/mL (ref >5.9)

## 2021-09-30 LAB — HEMOGLOBIN AND HEMATOCRIT, BLOOD
HCT: 29.2 % — ABNORMAL LOW (ref 36.0–46.0)
Hemoglobin: 9.4 g/dL — ABNORMAL LOW (ref 12.0–15.0)

## 2021-09-30 LAB — VITAMIN B12: Vitamin B-12: 147 pg/mL — ABNORMAL LOW (ref 180–914)

## 2021-09-30 LAB — FERRITIN: Ferritin: 224 ng/mL (ref 11–307)

## 2021-09-30 MED ORDER — QUETIAPINE FUMARATE 25 MG PO TABS: 12.5000 mg | ORAL_TABLET | Freq: Every day | ORAL | Status: DC

## 2021-09-30 MED ORDER — CYANOCOBALAMIN 1000 MCG/ML IJ SOLN
1000.0000 ug | Freq: Every day | INTRAMUSCULAR | Status: DC
  Filled 2021-09-30: qty 1

## 2021-09-30 MED ORDER — VITAMIN B-12 1000 MCG PO TABS: 1000.0000 ug | ORAL_TABLET | Freq: Every day | ORAL | Status: DC

## 2021-09-30 NOTE — Discharge Summary (Signed)
Physician Discharge Summary  Linda Copeland KVQ:[id number] DOB: [birth year] DOA: 09/25/2021  PCP: Gaynelle Arabian, MD  Admit date: 09/25/2021 Discharge date: 09/30/2021 Admitted From: Home Disposition: SNF Recommendations for Outpatient Follow-up:  Follow ups as below. Please obtain CBC/BMP/Mag at follow up Recommend palliative follow-up at SNF Please follow up on the following pending results: None  Discharge Condition: Stable CODE STATUS: Full code  Contact information for follow-up providers     Rod Can, MD. Schedule an appointment as soon as possible for a visit in 2 week(s).   Specialty: Orthopedic Surgery Why: For suture removal Contact information: [address] [place] [address] [phone]              Contact information for after-discharge care     Destination     [facility] .   Service: Skilled Nursing Contact information: [address] [place] Hospital Course: 87 y.o. female with a history of recent memory decline/suspected dementia, HTN, osteopenia with vertebral compression fractures, severe glaucoma, hypothyroidism who presented to the ED by EMS after an unwitnessed fall at home 12/24 with resultant right hip pain, confirmed to have displaced, impacted right femoral neck fracture s/p surgical repair on 09/26/21.  She is discharged on low-dose aspirin for VTEprophylaxis, and as needed Tylenol and Norco for pain control.  Outpatient follow-up with orthopedic surgery in 2 weeks as above.  Hospital course complicated by confusion and delirium that seems to have improved.  See individual problem list below for more on hospital course.  Discharge Diagnoses:  Unwitnessed fall at home Displaced right femoral neck fracture likely due to the above S/p right total hip arthroplasty on 09/26/2021 by Dr. Lyla Glassing orthopedics -Low-dose aspirin for  VTE prophylaxis -Tylenol and Norco as needed for pain control -Bowel regimen -Continue PT/OT at SNF.     Acute blood loss anemia: Some element of hemodilution from IV fluid as well.  H&H is stable after initial drop Vitamin B12 deficiency: Received vitamin B12 injection while in house. Recent Labs    01/11/21 1210 09/25/21 0225 09/26/21 0229 09/27/21 0434 09/28/21 0437 09/28/21 0943 09/29/21 0438 09/30/21 0515  HGB 13.7 13.6 13.2 10.0* 9.0* 8.8* 8.6* 9.4*  -Discharged on p.o. vitamin B12 1000 mcg daily   Acute hospital delirium on chronic cognitive impairment: Per patient's son, no formal diagnosis of dementia but intermittent confusion with visual hallucination at times. -Reorientation delirium precautions -Palliative follow-up at SNF.   Hyponatremia: Likely from dehydration.  Resolved.   Essential hypertension: Normotensive for most part. -Continue lisinopril 20 mg daily   Glaucoma -Continue home gtt's   Hypothyroidism: TSH 2.0. -Continue synthroid 138mcg   Osteopenia -Continue home Boniva   Leukocytosis: Likely demargination from the above.  Resolved.   Goal of care counseling: Advanced age with significant cognitive impairment.  Still full code.  Discussed about CODE STATUS including pros and cons of CPR and intubation with patient's son, [name] at bedside.  I recommended DNR/DNI but [name] would like to discuss this with his brother before making decision. -Palliative follow-up at SNF.  Body mass index is 27.08 kg/m.           Discharge Exam: Vitals:   09/29/21 1224 09/29/21 1958 09/30/21 0605 09/30/21 1259  BP: (!) 171/82 (!) 171/91 (!) 120/58 (!) 154/87  Pulse: 84 90 78 99  Temp: 98.4 F (36.9 C) 98.1  F (36.7 C) 97.9 F (36.6 C) 98.1 F (36.7 C)  Resp: 14 18 18  (!) 23  Height:      Weight:      SpO2: 95% 96% 94% 96%  TempSrc: Oral  Oral Oral  BMI (Calculated):         GENERAL: Frail looking elderly female.  Nontoxic. HEENT: MMM.  Vision and  hearing grossly intact.  NECK: Supple.  No apparent JVD.  RESP: 98% on RA.  No IWOB.  Fair aeration bilaterally. CVS:  RRR. Heart sounds normal.  ABD/GI/GU: Bowel sounds present. Soft. Non tender.  MSK/EXT:  Moves extremities. No apparent deformity. No edema.  SKIN: Dressing over surgical site DCI. NEURO: Awake and alert.  Oriented to self.  No apparent focal neuro deficit. PSYCH: Calm. Normal affect.   Discharge Instructions  Discharge Instructions     Diet general   Complete by: As directed    Increase activity slowly   Complete by: As directed    No wound care   Complete by: As directed       Allergies as of 09/30/2021       Reactions   Codeine Nausea And Vomiting, Other (See Comments)   uknown   Biaxin [clarithromycin] Other (See Comments)   Sweats   Caffeine    Shaky        Medication List     STOP taking these medications    meloxicam 7.5 MG tablet Commonly known as: MOBIC       TAKE these medications    acetaminophen 325 MG tablet Commonly known as: Tylenol Take 2 tablets (650 mg total) by mouth every 6 (six) hours as needed for mild pain, fever or headache.   aspirin 81 MG chewable tablet Commonly known as: Aspirin Childrens Chew 1 tablet (81 mg total) by mouth 2 (two) times daily.   dorzolamide 2 % ophthalmic solution Commonly known as: TRUSOPT Place 1 drop into the right eye 2 (two) times daily.   HYDROcodone-acetaminophen 5-325 MG tablet Commonly known as: NORCO/VICODIN Take 1-2 tablets by mouth every 4 (four) hours as needed for moderate pain (pain score 4-6).   ibandronate 150 MG tablet Commonly known as: BONIVA Take 150 mg by mouth every 30 (thirty) days.   levothyroxine 100 MCG tablet Commonly known as: SYNTHROID Take 100 mcg by mouth daily before breakfast.   lisinopril 20 MG tablet Commonly known as: ZESTRIL Take 20 mg by mouth daily.   Lumigan 0.01 % Soln Generic drug: bimatoprost Place 1 drop into both eyes at  bedtime.   ondansetron 4 MG tablet Commonly known as: ZOFRAN Take 1 tablet (4 mg total) by mouth every 6 (six) hours as needed for nausea.   polyethylene glycol 17 g packet Commonly known as: MIRALAX / GLYCOLAX Take 17 g by mouth 2 (two) times daily as needed for mild constipation.   senna 8.6 MG Tabs tablet Commonly known as: SENOKOT Take 1 tablet (8.6 mg total) by mouth 2 (two) times daily.   vitamin B-12 1000 MCG tablet Commonly known as: CYANOCOBALAMIN Take 1 tablet (1,000 mcg total) by mouth daily.        Consultations: Orthopedic surgery  Procedures/Studies: 09/26/2021-Right total hip arthroplasty, anterior approach   DG Chest 1 View  Result Date: 09/25/2021 CLINICAL DATA:  Fall and confusion EXAM: CHEST  1 VIEW COMPARISON:  07/09/2019 FINDINGS: The heart size and mediastinal contours are within normal limits. Both lungs are clear. The visualized skeletal structures are unremarkable. IMPRESSION: No active  disease. Electronically Signed   By: Ulyses Jarred M.D.   On: 09/25/2021 03:03   CT Head Wo Contrast  Result Date: 09/25/2021 CLINICAL DATA:  Recent fall several hours ago with headaches and neck pain, initial encounter EXAM: CT HEAD WITHOUT CONTRAST CT CERVICAL SPINE WITHOUT CONTRAST TECHNIQUE: Multidetector CT imaging of the head and cervical spine was performed following the standard protocol without intravenous contrast. Multiplanar CT image reconstructions of the cervical spine were also generated. COMPARISON:  None. FINDINGS: CT HEAD FINDINGS Brain: No evidence of acute infarction, hemorrhage, hydrocephalus, extra-axial collection or mass lesion/mass effect. Chronic atrophic and ischemic changes are noted. Vascular: No hyperdense vessel or unexpected calcification. Skull: Normal. Negative for fracture or focal lesion. Sinuses/Orbits: No acute finding. Other: None. CT CERVICAL SPINE FINDINGS Alignment: Mild degenerative anterolisthesis of C7 on T1 is noted. Skull base  and vertebrae: 7 cervical segments are well visualized. Vertebral body height is well maintained. Irregularity along the superior aspect of the C6 vertebral body is noted with associated osteophytic changes consistent with degenerative change. No compression deformity is noted. Facet hypertrophic changes are noted. No acute fracture or acute facet abnormality is noted. Soft tissues and spinal canal: Surrounding soft tissue structures are within normal limits. Upper chest: Visualized lung apices demonstrate a small 3-4 mm nodule in the right upper lobe best seen on image number 72 of series 6. Other: None IMPRESSION: CT of the head: Chronic atrophic and ischemic changes. CT of cervical spine: Multilevel degenerative change. 3-4 mm nodule in the right upper lobe. No follow-up needed if patient is low-risk. Non-contrast chest CT can be considered in 12 months if patient is high-risk. This recommendation follows the consensus statement: Guidelines for Management of Incidental Pulmonary Nodules Detected on CT Images: From the Fleischner Society 2017; Radiology 2017; [id number]:228-243. Electronically Signed   By: Inez Catalina M.D.   On: 09/25/2021 03:19   CT Cervical Spine Wo Contrast  Result Date: 09/25/2021 CLINICAL DATA:  Recent fall several hours ago with headaches and neck pain, initial encounter EXAM: CT HEAD WITHOUT CONTRAST CT CERVICAL SPINE WITHOUT CONTRAST TECHNIQUE: Multidetector CT imaging of the head and cervical spine was performed following the standard protocol without intravenous contrast. Multiplanar CT image reconstructions of the cervical spine were also generated. COMPARISON:  None. FINDINGS: CT HEAD FINDINGS Brain: No evidence of acute infarction, hemorrhage, hydrocephalus, extra-axial collection or mass lesion/mass effect. Chronic atrophic and ischemic changes are noted. Vascular: No hyperdense vessel or unexpected calcification. Skull: Normal. Negative for fracture or focal lesion. Sinuses/Orbits:  No acute finding. Other: None. CT CERVICAL SPINE FINDINGS Alignment: Mild degenerative anterolisthesis of C7 on T1 is noted. Skull base and vertebrae: 7 cervical segments are well visualized. Vertebral body height is well maintained. Irregularity along the superior aspect of the C6 vertebral body is noted with associated osteophytic changes consistent with degenerative change. No compression deformity is noted. Facet hypertrophic changes are noted. No acute fracture or acute facet abnormality is noted. Soft tissues and spinal canal: Surrounding soft tissue structures are within normal limits. Upper chest: Visualized lung apices demonstrate a small 3-4 mm nodule in the right upper lobe best seen on image number 72 of series 6. Other: None IMPRESSION: CT of the head: Chronic atrophic and ischemic changes. CT of cervical spine: Multilevel degenerative change. 3-4 mm nodule in the right upper lobe. No follow-up needed if patient is low-risk. Non-contrast chest CT can be considered in 12 months if patient is high-risk. This recommendation follows the consensus statement: Guidelines  for Management of Incidental Pulmonary Nodules Detected on CT Images: From the Fleischner Society 2017; Radiology 2017; [phone]. Electronically Signed   By: Inez Catalina M.D.   On: 09/25/2021 03:19   Pelvis Portable  Result Date: 09/26/2021 CLINICAL DATA:  Right hip replacement EXAM: PORTABLE PELVIS 1-2 VIEWS COMPARISON:  None. FINDINGS: Changes of right hip replacement. Normal AP alignment. No hardware bony complicating feature. IMPRESSION: Right hip replacement.  No visible complicating feature. Electronically Signed   By: Rolm Baptise M.D.   On: 09/26/2021 15:42   DG Knee Right Port  Result Date: 09/25/2021 CLINICAL DATA:  Recent fall with known right hip fracture, initial encounter EXAM: PORTABLE RIGHT KNEE - 2 VIEW COMPARISON:  None. FINDINGS: No acute fracture or dislocation is noted. Mild degenerative changes are noted. No  joint effusion is seen. IMPRESSION: Mild degenerative change without acute abnormality. Electronically Signed   By: Inez Catalina M.D.   On: 09/25/2021 22:10   DG C-Arm 1-60 Min-No Report  Result Date: 09/26/2021 Fluoroscopy was utilized by the requesting physician.  No radiographic interpretation.   DG C-Arm 1-60 Min-No Report  Result Date: 09/26/2021 Fluoroscopy was utilized by the requesting physician.  No radiographic interpretation.   DG HIP OPERATIVE UNILAT W OR W/O PELVIS RIGHT  Result Date: 09/26/2021 CLINICAL DATA:  Right hip arthroplasty EXAM: OPERATIVE RIGHT HIP (WITH PELVIS IF PERFORMED)  VIEWS TECHNIQUE: Fluoroscopic spot image(s) were submitted for interpretation post-operatively. COMPARISON:  None. FLUOROSCOPY TIME:  00:06 FINDINGS: Intraoperative fluoroscopic images of the right hip demonstrate total hip arthroplasty. No obvious perihardware fracture or component malpositioning. IMPRESSION: Intraoperative fluoroscopic images of the right hip demonstrate total hip arthroplasty. No obvious perihardware fracture or component malpositioning. Electronically Signed   By: Delanna Ahmadi M.D.   On: 09/26/2021 15:08   DG Hip Unilat W or Wo Pelvis 2-3 Views Right  Result Date: 09/25/2021 CLINICAL DATA:  Fall and right hip pain. EXAM: DG HIP (WITH OR WITHOUT PELVIS) 2-3V RIGHT COMPARISON:  Right hip radiograph dated 01/11/2021. FINDINGS: There is a fracture of the right femoral neck with mild proximal migration and impaction of the femoral shaft. No dislocation. The bones are osteopenic. The soft tissues are unremarkable. Vascular calcifications noted. IMPRESSION: Mildly displaced and impacted right femoral neck fracture. Electronically Signed   By: Anner Crete M.D.   On: 09/25/2021 02:55       The results of significant diagnostics from this hospitalization (including imaging, microbiology, ancillary and laboratory) are listed below for reference.     Microbiology: Recent  Results (from the past 240 hour(s))  Resp Panel by RT-PCR (Flu A&B, Covid) Nasopharyngeal Swab     Status: None   Collection Time: 09/25/21  2:07 AM   Specimen: Nasopharyngeal Swab; Nasopharyngeal(NP) swabs in vial transport medium  Result Value Ref Range Status   SARS Coronavirus 2 by RT PCR NEGATIVE NEGATIVE Final    Comment: (NOTE) SARS-CoV-2 target nucleic acids are NOT DETECTED.  The SARS-CoV-2 RNA is generally detectable in upper respiratory specimens during the acute phase of infection. The lowest concentration of SARS-CoV-2 viral copies this assay can detect is 138 copies/mL. A negative result does not preclude SARS-Cov-2 infection and should not be used as the sole basis for treatment or other patient management decisions. A negative result may occur with  improper specimen collection/handling, submission of specimen other than nasopharyngeal swab, presence of viral mutation(s) within the areas targeted by this assay, and inadequate number of viral copies(<138 copies/mL). A negative result must be  combined with clinical observations, patient history, and epidemiological information. The expected result is Negative.  Fact Sheet for Patients:  EntrepreneurPulse.com.au  Fact Sheet for Healthcare Providers:  IncredibleEmployment.be  This test is no t yet approved or cleared by the [place] FDA and  has been authorized for detection and/or diagnosis of SARS-CoV-2 by FDA under an Emergency Use Authorization (EUA). This EUA will remain  in effect (meaning this test can be used) for the duration of the COVID-19 declaration under Section 564(b)(1) of the Act, 21 U.S.C.section 360bbb-3(b)(1), unless the authorization is terminated  or revoked sooner.       Influenza A by PCR NEGATIVE NEGATIVE Final   Influenza B by PCR NEGATIVE NEGATIVE Final    Comment: (NOTE) The Xpert Xpress SARS-CoV-2/FLU/RSV plus assay is intended as an aid in the  diagnosis of influenza from Nasopharyngeal swab specimens and should not be used as a sole basis for treatment. Nasal washings and aspirates are unacceptable for Xpert Xpress SARS-CoV-2/FLU/RSV testing.  Fact Sheet for Patients: EntrepreneurPulse.com.au  Fact Sheet for Healthcare Providers: IncredibleEmployment.be  This test is not yet approved or cleared by the [place] FDA and has been authorized for detection and/or diagnosis of SARS-CoV-2 by FDA under an Emergency Use Authorization (EUA). This EUA will remain in effect (meaning this test can be used) for the duration of the COVID-19 declaration under Section 564(b)(1) of the Act, 21 U.S.C. section 360bbb-3(b)(1), unless the authorization is terminated or revoked.  Performed at [facility], [place] [address] [place], [place] [address]   Surgical pcr screen     Status: None   Collection Time: 09/26/21  3:01 AM   Specimen: Nasal Mucosa; Nasal Swab  Result Value Ref Range Status   MRSA, PCR NEGATIVE NEGATIVE Final   Staphylococcus aureus NEGATIVE NEGATIVE Final    Comment: (NOTE) The Xpert SA Assay (FDA approved for NASAL specimens in patients 65 years of age and older), is one component of a comprehensive surveillance program. It is not intended to diagnose infection nor to guide or monitor treatment. Performed at [facility], [place] [address], [place], [place] [address]   Resp Panel by RT-PCR (Flu A&B, Covid) Nasopharyngeal Swab     Status: None   Collection Time: 09/28/21 12:43 AM   Specimen: Nasopharyngeal Swab; Nasopharyngeal(NP) swabs in vial transport medium  Result Value Ref Range Status   SARS Coronavirus 2 by RT PCR NEGATIVE NEGATIVE Final    Comment: (NOTE) SARS-CoV-2 target nucleic acids are NOT DETECTED.  The SARS-CoV-2 RNA is generally detectable in upper respiratory specimens during the acute phase of infection. The lowest concentration of SARS-CoV-2 viral  copies this assay can detect is 138 copies/mL. A negative result does not preclude SARS-Cov-2 infection and should not be used as the sole basis for treatment or other patient management decisions. A negative result may occur with  improper specimen collection/handling, submission of specimen other than nasopharyngeal swab, presence of viral mutation(s) within the areas targeted by this assay, and inadequate number of viral copies(<138 copies/mL). A negative result must be combined with clinical observations, patient history, and epidemiological information. The expected result is Negative.  Fact Sheet for Patients:  EntrepreneurPulse.com.au  Fact Sheet for Healthcare Providers:  IncredibleEmployment.be  This test is no t yet approved or cleared by the [place] FDA and  has been authorized for detection and/or diagnosis of SARS-CoV-2 by FDA under an Emergency Use Authorization (EUA). This EUA will remain  in effect (meaning this  test can be used) for the duration of the COVID-19 declaration under Section 564(b)(1) of the Act, 21 U.S.C.section 360bbb-3(b)(1), unless the authorization is terminated  or revoked sooner.       Influenza A by PCR NEGATIVE NEGATIVE Final   Influenza B by PCR NEGATIVE NEGATIVE Final    Comment: (NOTE) The Xpert Xpress SARS-CoV-2/FLU/RSV plus assay is intended as an aid in the diagnosis of influenza from Nasopharyngeal swab specimens and should not be used as a sole basis for treatment. Nasal washings and aspirates are unacceptable for Xpert Xpress SARS-CoV-2/FLU/RSV testing.  Fact Sheet for Patients: EntrepreneurPulse.com.au  Fact Sheet for Healthcare Providers: IncredibleEmployment.be  This test is not yet approved or cleared by the [place] FDA and has been authorized for detection and/or diagnosis of SARS-CoV-2 by FDA under an Emergency Use Authorization (EUA). This  EUA will remain in effect (meaning this test can be used) for the duration of the COVID-19 declaration under Section 564(b)(1) of the Act, 21 U.S.C. section 360bbb-3(b)(1), unless the authorization is terminated or revoked.  Performed at [facility], [place] [address] [place], [place] [address]      Labs:  CBC: Recent Labs  Lab 09/25/21 0225 09/26/21 0229 09/27/21 0434 09/28/21 0437 09/28/21 0943 09/29/21 0438 09/30/21 0515  WBC 10.3 11.0* 12.3* 10.1  --  9.3  --   NEUTROABS 8.6*  --   --   --   --   --   --   HGB 13.6 13.2 10.0* 9.0* 8.8* 8.6* 9.4*  HCT 42.1 39.4 30.1* 27.2* 26.5* 26.0* 29.2*  MCV 90.0 90.6 89.1 90.7  --  90.6  --   PLT 207 184 159 151  --  161  --    BMP &GFR Recent Labs  Lab 09/25/21 0225 09/26/21 0229 09/27/21 0434 09/28/21 0437  NA 133* 133* 133* 136  K 3.8 3.6 3.6 3.7  CL 102 101 104 105  CO2 21* 22 24 24   GLUCOSE 133* 104* 122* 104*  BUN 11 9 18 12   CREATININE 0.52 0.43* 0.59 0.40*  CALCIUM 9.0 8.7* 7.7* 8.3*   Estimated Creatinine Clearance: 42 mL/min (A) (by C-G formula based on SCr of 0.4 mg/dL (L)). Liver & Pancreas: Recent Labs  Lab 09/25/21 0225  AST 22  ALT 21  ALKPHOS 67  BILITOT 0.6  PROT 7.3  ALBUMIN 3.7   No results for input(s): LIPASE, AMYLASE in the last 168 hours. No results for input(s): AMMONIA in the last 168 hours. Diabetic: No results for input(s): HGBA1C in the last 72 hours.  No results for input(s): GLUCAP in the last 168 hours. Cardiac Enzymes: No results for input(s): CKTOTAL, CKMB, CKMBINDEX, TROPONINI in the last 168 hours. No results for input(s): PROBNP in the last 8760 hours. Coagulation Profile: No results for input(s): INR, PROTIME in the last 168 hours. Thyroid Function Tests: No results for input(s): TSH, T4TOTAL, FREET4, T3FREE, THYROIDAB in the last 72 hours. Lipid Profile: No results for input(s): CHOL, HDL, LDLCALC, TRIG, CHOLHDL, LDLDIRECT in the last 72  hours. Anemia Panel: Recent Labs    09/30/21 0515  VITAMINB12 147*  FOLATE 7.9  FERRITIN 224  TIBC 172*  IRON 25*  RETICCTPCT 1.5   Urine analysis:    Component Value Date/Time   COLORURINE YELLOW 09/25/2021 0330   APPEARANCEUR CLOUDY (A) 09/25/2021 0330   LABSPEC 1.025 09/25/2021 0330   PHURINE 7.0 09/25/2021 0330   GLUCOSEU NEGATIVE 09/25/2021 0330   HGBUR TRACE (A) 09/25/2021 0330  BILIRUBINUR NEGATIVE 09/25/2021 0330   KETONESUR NEGATIVE 09/25/2021 0330   PROTEINUR NEGATIVE 09/25/2021 0330   NITRITE NEGATIVE 09/25/2021 0330   LEUKOCYTESUR TRACE (A) 09/25/2021 0330   Sepsis Labs: Invalid input(s): PROCALCITONIN, LACTICIDVEN   Time coordinating discharge: 45 minutes  SIGNED:  Mercy Riding, MD  [facility] 09/30/2021, 3:01 PM

## 2021-09-30 NOTE — TOC Progression Note (Deleted)
Transition of Care [facility]) - Progression Note    Patient Details  Name: Linda Copeland MRN: [id number] Date of Birth: [birth year]  [facility] CM/SW Contact  Leeroy Cha, RN Phone Number: 09/30/2021, 2:15 PM  Clinical Narrative:       Expected Discharge Plan: [place] Barriers to Discharge: Insurance Authorization  Expected Discharge Plan and Services Expected Discharge Plan: [place]   Discharge Planning Services: CM Consult   Living arrangements for the past 2 months: Single Family Home Expected Discharge Date: 09/29/21                                     Social Determinants of Health (SDOH) Interventions    Readmission Risk Interventions No flowsheet data found.

## 2021-09-30 NOTE — Progress Notes (Signed)
PROGRESS NOTE  Linda Copeland BSW:[id number] DOB: January 22, 1931   PCP: Gaynelle Arabian, MD  Patient is from: Home.   DOA: 09/25/2021 LOS: 5  Chief complaints:  Chief Complaint  Patient presents with   Fall / Right Hip pain      Brief Narrative / Interim history: 87 y.o. female with a history of recent memory decline/suspected dementia, HTN, osteopenia with vertebral compression fractures, severe glaucoma, hypothyroidism who presented to the ED by EMS after an unwitnessed fall at home 12/24 with resultant right hip pain, confirmed to have displaced, impacted right femoral neck fracture s/p surgical repair on 09/26/21.  She is discharged on low-dose aspirin for VTEprophylaxis, and as needed Tylenol and Norco for pain control.  Outpatient follow-up with orthopedic surgery in 2 weeks as above.   Hospital course complicated by confusion and delirium that seems to have improved.   See individual problem list below for more on hospital course.  Subjective: Seen and examined earlier this morning.  No major events overnight of this morning.  Not a reliable historian.  Per patient's RN, she was up last night.  An episode of choking while eating breakfast but resolved quickly.  Cleared by SLP.   Objective: Vitals:   09/29/21 1224 09/29/21 1958 09/30/21 0605 09/30/21 1259  BP: (!) 171/82 (!) 171/91 (!) 120/58 (!) 154/87  Pulse: 84 90 78 99  Resp: 14 18 18  (!) 23  Temp: 98.4 F (36.9 C) 98.1 F (36.7 C) 97.9 F (36.6 C) 98.1 F (36.7 C)  TempSrc: Oral  Oral Oral  SpO2: 95% 96% 94% 96%  Weight:      Height:        Examination:  GENERAL: No apparent distress.  Nontoxic. HEENT: MMM.  Vision and hearing grossly intact.  NECK: Supple.  No apparent JVD.  RESP: 90% on RA.  No IWOB.  Fair aeration bilaterally. CVS:  RRR. Heart sounds normal.  ABD/GI/GU: BS+. Abd soft, NTND.  MSK/EXT:  Moves extremities. No apparent deformity. No edema.  SKIN: no apparent skin lesion or wound.  Dressing  over surgical site DCI. NEURO: Awake.  Oriented only to self.  No apparent focal neuro deficit. PSYCH: Calm. Normal affect.   Procedures:  09/26/2021-Right total hip arthroplasty, anterior approach.   Microbiology summarized: COVID-19 and influenza PCR nonreactive. Surgical MRSA PCR screen negative.  Assessment & Plan: Unwitnessed fall at home Displaced right femoral neck fracture likely due to the above S/p right total hip arthroplasty on 09/26/2021 by Dr. Lyla Glassing orthopedics -Low-dose aspirin for VTE prophylaxis -Tylenol and Norco as needed for pain control -Bowel regimen -Continue PT/OT at SNF.   ABLA: Some element of hemodilution from IV fluid as well.  H&H is stable after initial drop.  Vitamin B12 deficiency: 147. Recent Labs    01/11/21 1210 09/25/21 0225 09/26/21 0229 09/27/21 0434 09/28/21 0437 09/28/21 0943 09/29/21 0438 09/30/21 0515  HGB 13.7 13.6 13.2 10.0* 9.0* 8.8* 8.6* 9.4*  -Subcu vitamin B12 1000 mcg daily while in house.   Acute hospital delirium on chronic cognitive impairment: Per patient's son, no formal diagnosis of dementia but intermittent confusion with visual hallucination at times. -Reorientation delirium precautions -Palliative follow-up at SNF.   Hyponatremia: Likely from dehydration.  Resolved.   Essential hypertension: Normotensive for most part. -Continue lisinopril 20 mg daily   Glaucoma -Continue home gtt's   Hypothyroidism: TSH 2.0. -Continue synthroid 149mcg   Osteopenia -Continue home Boniva   Leukocytosis: Likely demargination from the above.  Resolved.   Goal of  care counseling: Remains full code per family's preference. -Palliative follow-up at SNF  Body mass index is 27.08 kg/m.         DVT prophylaxis:  SCDs Start: 09/26/21 1601 On low-dose aspirin  Code Status: Full code Family Communication: Updated patient's son at bedside on 12/28.  None at bedside today. Level of care: Med-Surg Status is:  Inpatient  Remains inpatient appropriate because: Pending insurance authorization for SNF   Final disposition: SNF    Consultants:  Orthopedic surgery   Sch Meds:  Scheduled Meds:  acetaminophen  1,000 mg Oral Q8H   aspirin EC  81 mg Oral Q breakfast   cyanocobalamin  1,000 mcg Intramuscular Daily   docusate  100 mg Oral BID   dorzolamide  1 drop Right Eye BID   latanoprost  1 drop Both Eyes QHS   levothyroxine  100 mcg Oral QAC breakfast   lisinopril  20 mg Oral Daily   mouth rinse  15 mL Mouth Rinse BID   QUEtiapine  12.5 mg Oral QHS   senna  1 tablet Oral BID   Continuous Infusions: PRN Meds:.hydrALAZINE, menthol-cetylpyridinium **OR** phenol, methocarbamol **OR** [DISCONTINUED] methocarbamol (ROBAXIN) IV, metoCLOPramide **OR** metoCLOPramide (REGLAN) injection, ondansetron **OR** ondansetron (ZOFRAN) IV, oxyCODONE, polyethylene glycol, sodium phosphate, sorbitol  Antimicrobials: Anti-infectives (From admission, onward)    Start     Dose/Rate Route Frequency Ordered Stop   09/26/21 2000  ceFAZolin (ANCEF) IVPB 2g/100 mL premix        2 g 200 mL/hr over 30 Minutes Intravenous Every 6 hours 09/26/21 1600 09/27/21 0323   09/26/21 1545  ceFAZolin (ANCEF) IVPB 2g/100 mL premix        2 g 200 mL/hr over 30 Minutes Intravenous On call to O.R. 09/26/21 0258 09/26/21 1335        I have personally reviewed the following labs and images: CBC: Recent Labs  Lab 09/25/21 0225 09/26/21 0229 09/27/21 0434 09/28/21 0437 09/28/21 0943 09/29/21 0438 09/30/21 0515  WBC 10.3 11.0* 12.3* 10.1  --  9.3  --   NEUTROABS 8.6*  --   --   --   --   --   --   HGB 13.6 13.2 10.0* 9.0* 8.8* 8.6* 9.4*  HCT 42.1 39.4 30.1* 27.2* 26.5* 26.0* 29.2*  MCV 90.0 90.6 89.1 90.7  --  90.6  --   PLT 207 184 159 151  --  161  --    BMP &GFR Recent Labs  Lab 09/25/21 0225 09/26/21 0229 09/27/21 0434 09/28/21 0437  NA 133* 133* 133* 136  K 3.8 3.6 3.6 3.7  CL 102 101 104 105  CO2 21* 22  24 24   GLUCOSE 133* 104* 122* 104*  BUN 11 9 18 12   CREATININE 0.52 0.43* 0.59 0.40*  CALCIUM 9.0 8.7* 7.7* 8.3*   Estimated Creatinine Clearance: 42 mL/min (A) (by C-G formula based on SCr of 0.4 mg/dL (L)). Liver & Pancreas: Recent Labs  Lab 09/25/21 0225  AST 22  ALT 21  ALKPHOS 67  BILITOT 0.6  PROT 7.3  ALBUMIN 3.7   No results for input(s): LIPASE, AMYLASE in the last 168 hours. No results for input(s): AMMONIA in the last 168 hours. Diabetic: No results for input(s): HGBA1C in the last 72 hours.  No results for input(s): GLUCAP in the last 168 hours. Cardiac Enzymes: No results for input(s): CKTOTAL, CKMB, CKMBINDEX, TROPONINI in the last 168 hours. No results for input(s): PROBNP in the last 8760 hours. Coagulation Profile: No  results for input(s): INR, PROTIME in the last 168 hours. Thyroid Function Tests: No results for input(s): TSH, T4TOTAL, FREET4, T3FREE, THYROIDAB in the last 72 hours.  Lipid Profile: No results for input(s): CHOL, HDL, LDLCALC, TRIG, CHOLHDL, LDLDIRECT in the last 72 hours. Anemia Panel: Recent Labs    09/30/21 0515  VITAMINB12 147*  FOLATE 7.9  FERRITIN 224  TIBC 172*  IRON 25*  RETICCTPCT 1.5   Urine analysis:    Component Value Date/Time   COLORURINE YELLOW 09/25/2021 0330   APPEARANCEUR CLOUDY (A) 09/25/2021 0330   LABSPEC 1.025 09/25/2021 0330   PHURINE 7.0 09/25/2021 0330   GLUCOSEU NEGATIVE 09/25/2021 0330   HGBUR TRACE (A) 09/25/2021 0330   BILIRUBINUR NEGATIVE 09/25/2021 0330   KETONESUR NEGATIVE 09/25/2021 0330   PROTEINUR NEGATIVE 09/25/2021 0330   NITRITE NEGATIVE 09/25/2021 0330   LEUKOCYTESUR TRACE (A) 09/25/2021 0330   Sepsis Labs: Invalid input(s): PROCALCITONIN, [place]  Microbiology: Recent Results (from the past 240 hour(s))  Resp Panel by RT-PCR (Flu A&B, Covid) Nasopharyngeal Swab     Status: None   Collection Time: 09/25/21  2:07 AM   Specimen: Nasopharyngeal Swab; Nasopharyngeal(NP) swabs in  vial transport medium  Result Value Ref Range Status   SARS Coronavirus 2 by RT PCR NEGATIVE NEGATIVE Final    Comment: (NOTE) SARS-CoV-2 target nucleic acids are NOT DETECTED.  The SARS-CoV-2 RNA is generally detectable in upper respiratory specimens during the acute phase of infection. The lowest concentration of SARS-CoV-2 viral copies this assay can detect is 138 copies/mL. A negative result does not preclude SARS-Cov-2 infection and should not be used as the sole basis for treatment or other patient management decisions. A negative result may occur with  improper specimen collection/handling, submission of specimen other than nasopharyngeal swab, presence of viral mutation(s) within the areas targeted by this assay, and inadequate number of viral copies(<138 copies/mL). A negative result must be combined with clinical observations, patient history, and epidemiological information. The expected result is Negative.  Fact Sheet for Patients:  EntrepreneurPulse.com.au  Fact Sheet for Healthcare Providers:  IncredibleEmployment.be  This test is no t yet approved or cleared by the [place] FDA and  has been authorized for detection and/or diagnosis of SARS-CoV-2 by FDA under an Emergency Use Authorization (EUA). This EUA will remain  in effect (meaning this test can be used) for the duration of the COVID-19 declaration under Section 564(b)(1) of the Act, 21 U.S.C.section 360bbb-3(b)(1), unless the authorization is terminated  or revoked sooner.       Influenza A by PCR NEGATIVE NEGATIVE Final   Influenza B by PCR NEGATIVE NEGATIVE Final    Comment: (NOTE) The Xpert Xpress SARS-CoV-2/FLU/RSV plus assay is intended as an aid in the diagnosis of influenza from Nasopharyngeal swab specimens and should not be used as a sole basis for treatment. Nasal washings and aspirates are unacceptable for Xpert Xpress SARS-CoV-2/FLU/RSV testing.  Fact  Sheet for Patients: EntrepreneurPulse.com.au  Fact Sheet for Healthcare Providers: IncredibleEmployment.be  This test is not yet approved or cleared by the [place] FDA and has been authorized for detection and/or diagnosis of SARS-CoV-2 by FDA under an Emergency Use Authorization (EUA). This EUA will remain in effect (meaning this test can be used) for the duration of the COVID-19 declaration under Section 564(b)(1) of the Act, 21 U.S.C. section 360bbb-3(b)(1), unless the authorization is terminated or revoked.  Performed at [facility], [place] [address] [place], [place] [address]   Surgical pcr screen  Status: None   Collection Time: 09/26/21  3:01 AM   Specimen: Nasal Mucosa; Nasal Swab  Result Value Ref Range Status   MRSA, PCR NEGATIVE NEGATIVE Final   Staphylococcus aureus NEGATIVE NEGATIVE Final    Comment: (NOTE) The Xpert SA Assay (FDA approved for NASAL specimens in patients 30 years of age and older), is one component of a comprehensive surveillance program. It is not intended to diagnose infection nor to guide or monitor treatment. Performed at [facility], [place] [address], [place], [place] [address]   Resp Panel by RT-PCR (Flu A&B, Covid) Nasopharyngeal Swab     Status: None   Collection Time: 09/28/21 12:43 AM   Specimen: Nasopharyngeal Swab; Nasopharyngeal(NP) swabs in vial transport medium  Result Value Ref Range Status   SARS Coronavirus 2 by RT PCR NEGATIVE NEGATIVE Final    Comment: (NOTE) SARS-CoV-2 target nucleic acids are NOT DETECTED.  The SARS-CoV-2 RNA is generally detectable in upper respiratory specimens during the acute phase of infection. The lowest concentration of SARS-CoV-2 viral copies this assay can detect is 138 copies/mL. A negative result does not preclude SARS-Cov-2 infection and should not be used as the sole basis for treatment or other patient management decisions. A negative  result may occur with  improper specimen collection/handling, submission of specimen other than nasopharyngeal swab, presence of viral mutation(s) within the areas targeted by this assay, and inadequate number of viral copies(<138 copies/mL). A negative result must be combined with clinical observations, patient history, and epidemiological information. The expected result is Negative.  Fact Sheet for Patients:  EntrepreneurPulse.com.au  Fact Sheet for Healthcare Providers:  IncredibleEmployment.be  This test is no t yet approved or cleared by the [place] FDA and  has been authorized for detection and/or diagnosis of SARS-CoV-2 by FDA under an Emergency Use Authorization (EUA). This EUA will remain  in effect (meaning this test can be used) for the duration of the COVID-19 declaration under Section 564(b)(1) of the Act, 21 U.S.C.section 360bbb-3(b)(1), unless the authorization is terminated  or revoked sooner.       Influenza A by PCR NEGATIVE NEGATIVE Final   Influenza B by PCR NEGATIVE NEGATIVE Final    Comment: (NOTE) The Xpert Xpress SARS-CoV-2/FLU/RSV plus assay is intended as an aid in the diagnosis of influenza from Nasopharyngeal swab specimens and should not be used as a sole basis for treatment. Nasal washings and aspirates are unacceptable for Xpert Xpress SARS-CoV-2/FLU/RSV testing.  Fact Sheet for Patients: EntrepreneurPulse.com.au  Fact Sheet for Healthcare Providers: IncredibleEmployment.be  This test is not yet approved or cleared by the [place] FDA and has been authorized for detection and/or diagnosis of SARS-CoV-2 by FDA under an Emergency Use Authorization (EUA). This EUA will remain in effect (meaning this test can be used) for the duration of the COVID-19 declaration under Section 564(b)(1) of the Act, 21 U.S.C. section 360bbb-3(b)(1), unless the authorization is  terminated or revoked.  Performed at [facility], [place] [address] [place], [place] [address]     Radiology Studies: No results found.    Jaylynn Siefert T. Idamay  If 7PM-7AM, please contact night-coverage [email or web address] 09/30/2021, 3:00 PM

## 2021-09-30 NOTE — TOC Progression Note (Addendum)
Transition of Care [facility]) - Progression Note    Patient Details  Name: Linda Copeland MRN: [id number] Date of Birth: Apr 06, 1931  [facility]) CM/SW Contact  Leeroy Cha, RN Phone Number: 09/30/2021, 2:14 PM  Clinical Narrative:    [facility] at [facility] x2 without return call. 1500 [facility]ct-[facility] transferred to [facility]. Was waiting on  call back patient can go to room 207  Expected Discharge Plan: [place] Barriers to Discharge: Insurance Authorization  Expected Discharge Plan and Services Expected Discharge Plan: [place]   Discharge Planning Services: CM Consult   Living arrangements for the past 2 months: Single Family Home Expected Discharge Date: 09/29/21                                     Social Determinants of Health (SDOH) Interventions    Readmission Risk Interventions No flowsheet data found.

## 2021-09-30 NOTE — Evaluation (Signed)
Clinical/Bedside Swallow Evaluation Patient Details  Name: Linda Copeland MRN: [id number] Date of Birth: 02-14-1931  Today's Date: 09/30/2021 Time: SLP Start Time (ACUTE ONLY): 0935 SLP Stop Time (ACUTE ONLY): 5027 SLP Time Calculation (min) (ACUTE ONLY): 15 min  Past Medical History:  Past Medical History:  Diagnosis Date   Arthritis    bilateral severe facet L4-L5, L5-S1   Carpal tunnel syndrome of right wrist    Chronic cervical radiculopathy    C5   Compression fracture of lumbar vertebra (HCC)    L2-L3   Environmental and seasonal allergies    Frequent urination    Hematoma of frontal scalp 10/06/2016   History of bronchitis    History of humerus fracture 10/06/2016   Right proximal   History of subdural hematoma 10/06/2016   Left falcotentorial    HOH (hard of hearing)    Hypertension    Hypothyroidism    Nasal fracture 10/06/2016   OA (osteoarthritis)    AC glenohumeral joinr   Osteopenia    Spinal stenosis at L4-L5 level    Past Surgical History:  Past Surgical History:  Procedure Laterality Date   CARPAL TUNNEL RELEASE Right 01/22/2018   Procedure: RIGHT CARPAL TUNNEL RELEASE;  Surgeon: Daryll Brod, MD;  Location: [facility];  Service: Orthopedics;  Laterality: Right;   COLONOSCOPY     TOTAL HIP ARTHROPLASTY Right 09/26/2021   Procedure: TOTAL HIP ARTHROPLASTY ANTERIOR APPROACH;  Surgeon: Rod Can, MD;  Location: [facility];  Service: Orthopedics;  Laterality: Right;   HPI:  Linda Copeland is an 87 yo female admitted after falling at home-sustained a R hip fx. S/P R THA-DA 09/26/21. Hx of suspected dementia/chronic cognitive impairment, hearing loss, osteopenia, SDH, R humerus fx 2018, compression fxs, cervical radiculopathy. The morning of 12/30 pt was coughing a lot with breakfast per RN, hence swallow evaluation was ordered.    Assessment / Plan / Recommendation  Clinical Impression  Pt did very well during clinical swallow assessment, and none  of the coughing that she presented with at breakfast was replicated.  She demonstrated thorough but slowed mastication (still functional), a brisk swallow response, and no s/s of aspiration when consuming successive boluses of thin liquid or when mixing solid/liquid consistencies. There do not appear to be ongoing swallowing difficulties and the coughing at breakfast was likely incidental.  Recommend continuing current regular diet, thin liquids. Meds with liquid. No SLP f/u here or at [facility] for rehab. SLP Visit Diagnosis: Dysphagia, unspecified (R13.10)    Aspiration Risk  No limitations    Diet Recommendation   Regular solids, thin liquids  Medication Administration: Whole meds with liquid    Other  Recommendations Oral Care Recommendations: Oral care BID    Recommendations for follow up therapy are one component of a multi-disciplinary discharge planning process, led by the attending physician.  Recommendations may be updated based on patient status, additional functional criteria and insurance authorization.  Follow up Recommendations No SLP follow up        Swallow Study   General HPI: Linda Copeland is an 87 yo female admitted after falling at home-sustained a R hip fx. S/P R THA-DA 09/26/21. Hx of suspected dementia/chronic cognitive impairment, hearing loss, osteopenia, SDH, R humerus fx 2018, compression fxs, cervical radiculopathy. The morning of 12/30 pt was coughing a lot with breakfast per RN, hence swallow evaluation was ordered. Type of Study: Bedside Swallow Evaluation Previous Swallow Assessment: no Diet Prior to this Study: Regular;Thin liquids Temperature Spikes Noted:  No Respiratory Status: Room air History of Recent Intubation: No Behavior/Cognition: Alert;Confused Oral Cavity Assessment: Within Functional Limits Oral Care Completed by SLP: Recent completion by staff Oral Cavity - Dentition: Adequate natural dentition Vision: Functional for self-feeding Self-Feeding  Abilities: Needs assist Patient Positioning: Upright in bed Baseline Vocal Quality: Other (comment) (high pitched) Volitional Cough: Strong Volitional Swallow: Able to elicit    Oral/Motor/Sensory Function Overall Oral Motor/Sensory Function: Within functional limits   Ice Chips Ice chips: Within functional limits   Thin Liquid Thin Liquid: Within functional limits    Nectar Thick Nectar Thick Liquid: Not tested   Honey Thick Honey Thick Liquid: Not tested   Puree Puree: Within functional limits   Solid     Solid: Within functional limits      Linda Copeland 09/30/2021,9:56 AM  Linda Copeland, [place] Office number [phone] Pager [phone]

## 2021-09-30 NOTE — Progress Notes (Signed)
Attempted to call report to [facility] x2 approximately 1 hour apart.  First call was unanswered.  Second call was answered, but rang unanswered when transferring to the patient's RN.

## 2021-09-30 NOTE — TOC Progression Note (Addendum)
Transition of Care [facility]) - Progression Note    Patient Details  Name: Redonna Wilbert MRN: [id number] Date of Birth: December 28, 1930  Transition of [facility]) CM/SW Contact  Miran Kautzman, Juliann Pulse, RN Phone Number: 09/30/2021, 7:40 AM  Clinical Narrative:  Noted d/c order in place. Left vm w/[facility] rep Tressa Busman if they received auth-awaiting auth(Non navi health plan). D/c plan SNF w/otpt Palliative Care Services. 12p-packet in shadow in case Josem Kaufmann is given today.    Expected Discharge Plan: Skilled Nursing Facility Barriers to Discharge: Insurance Authorization  Expected Discharge Plan and Services Expected Discharge Plan: [place]   Discharge Planning Services: CM Consult   Living arrangements for the past 2 months: Single Family Home Expected Discharge Date: 09/29/21                                     Social Determinants of Health (SDOH) Interventions    Readmission Risk Interventions No flowsheet data found.

## 2021-09-30 NOTE — Progress Notes (Signed)
Occupational Therapy Treatment Patient Details Name: Linda Copeland MRN: [id number] DOB: 07-22-1931 Today's Date: 09/30/2021   History of present illness 87 yo female admitted after falling at home-sustained a R hip fx. S/P R THA-DA 09/26/21. Hx of dementia, hearing loss, osteopenia, SDH, R humerus fx 2018, compression fxs, cervical radiculopathy   OT comments  Total assist for bed mobility, sat EOB with min guard assist x 10 minutes, stood x 2 with max assist. Pt requiring total assist to comb hair and don socks. Continues to be appropriate for SNF level rehab.   Recommendations for follow up therapy are one component of a multi-disciplinary discharge planning process, led by the attending physician.  Recommendations may be updated based on patient status, additional functional criteria and insurance authorization.    Follow Up Recommendations  Skilled nursing-short term rehab (<3 hours/day)    Assistance Recommended at Discharge Frequent or constant Supervision/Assistance  Equipment Recommendations  Other (comment) (defer to next venue)    Recommendations for Other Services      Precautions / Restrictions Precautions Precautions: Fall Restrictions Weight Bearing Restrictions: Yes RLE Weight Bearing: Weight bearing as tolerated       Mobility Bed Mobility Overal bed mobility: Needs Assistance Bed Mobility: Supine to Sit;Sit to Supine     Supine to sit: Total assist;HOB elevated Sit to supine: Total assist   General bed mobility comments: pt not assisting    Transfers Overall transfer level: Needs assistance Equipment used: Rolling walker (2 wheels);2 person hand held assist Transfers: Sit to/from Stand Sit to Stand: Max assist           General transfer comment: stood x 1 face to face, x 1 with RW and max assist     Balance Overall balance assessment: Needs assistance;History of Falls Sitting-balance support: Bilateral upper extremity supported Sitting  balance-Leahy Scale: Fair Sitting balance - Comments: min guard assist x 10 minutes   Standing balance support: Bilateral upper extremity supported Standing balance-Leahy Scale: Zero                             ADL either performed or assessed with clinical judgement   ADL Overall ADL's : Needs assistance/impaired     Grooming: Brushing hair;Sitting;Total assistance               Lower Body Dressing: Total assistance;Bed level                      Extremity/Trunk Assessment              Vision       Perception     Praxis      Cognition Arousal/Alertness: Awake/alert Behavior During Therapy: WFL for tasks assessed/performed Overall Cognitive Status: History of cognitive impairments - at baseline                                 General Comments: patient able to follow some simple directions and make needs known          Exercises     Shoulder Instructions       General Comments      Pertinent Vitals/ Pain       Pain Assessment: Faces Faces Pain Scale: No hurt  Home Living  Prior Functioning/Environment              Frequency  Min 2X/week        Progress Toward Goals  OT Goals(current goals can now be found in the care plan section)  Progress towards OT goals: Progressing toward goals  Acute Rehab OT Goals OT Goal Formulation: With family Time For Goal Achievement: 10/11/21 Potential to Achieve Goals: [place] Discharge plan remains appropriate    Co-evaluation                 AM-PAC OT "6 Clicks" Daily Activity     Outcome Measure   Help from another person eating meals?: A Little Help from another person taking care of personal grooming?: Total Help from another person toileting, which includes using toliet, bedpan, or urinal?: Total Help from another person bathing (including washing, rinsing, drying)?: Total Help from  another person to put on and taking off regular upper body clothing?: A Lot Help from another person to put on and taking off regular lower body clothing?: Total 6 Click Score: 9    End of Session Equipment Utilized During Treatment: Rolling walker (2 wheels);Gait belt  OT Visit Diagnosis: Unsteadiness on feet (R26.81);Other symptoms and signs involving cognitive function;History of falling (Z91.81);Muscle weakness (generalized) (M62.81);Repeated falls (R29.6)   Activity Tolerance Patient tolerated treatment well   Patient Left in bed;with call bell/phone within reach;with bed alarm set;with SCD's reapplied   Nurse Communication          Time: 7356-7014 OT Time Calculation (min): 25 min  Charges: OT General Charges $OT Visit: 1 Visit OT Treatments $Self Care/Home Management : 8-22 mins $Therapeutic Activity: 8-22 mins  Nestor Lewandowsky, OTR/L Acute Rehabilitation Services Pager: [phone] Office: [phone]  Malka So 09/30/2021, 11:11 AM

## 2021-10-05 DIAGNOSIS — R54 Age-related physical debility: Secondary | ICD-10-CM | POA: Diagnosis not present

## 2021-10-05 DIAGNOSIS — F29 Unspecified psychosis not due to a substance or known physiological condition: Secondary | ICD-10-CM | POA: Diagnosis not present

## 2021-10-05 DIAGNOSIS — F0390 Unspecified dementia without behavioral disturbance: Secondary | ICD-10-CM | POA: Diagnosis not present

## 2021-10-05 DIAGNOSIS — I10 Essential (primary) hypertension: Secondary | ICD-10-CM | POA: Diagnosis not present

## 2021-10-05 DIAGNOSIS — L89152 Pressure ulcer of sacral region, stage 2: Secondary | ICD-10-CM | POA: Diagnosis not present

## 2021-10-05 DIAGNOSIS — S7291XA Unspecified fracture of right femur, initial encounter for closed fracture: Secondary | ICD-10-CM | POA: Diagnosis not present

## 2021-10-05 DIAGNOSIS — S22000A Wedge compression fracture of unspecified thoracic vertebra, initial encounter for closed fracture: Secondary | ICD-10-CM | POA: Diagnosis not present

## 2021-10-05 DIAGNOSIS — I4510 Unspecified right bundle-branch block: Secondary | ICD-10-CM | POA: Diagnosis not present

## 2021-10-05 DIAGNOSIS — H409 Unspecified glaucoma: Secondary | ICD-10-CM | POA: Diagnosis not present

## 2021-10-14 DIAGNOSIS — F29 Unspecified psychosis not due to a substance or known physiological condition: Secondary | ICD-10-CM | POA: Diagnosis not present

## 2021-10-14 DIAGNOSIS — F0390 Unspecified dementia without behavioral disturbance: Secondary | ICD-10-CM | POA: Diagnosis not present

## 2021-10-14 DIAGNOSIS — S7291XD Unspecified fracture of right femur, subsequent encounter for closed fracture with routine healing: Secondary | ICD-10-CM | POA: Diagnosis not present

## 2021-10-18 ENCOUNTER — Encounter (HOSPITAL_COMMUNITY): Payer: Self-pay

## 2021-10-18 ENCOUNTER — Other Ambulatory Visit: Payer: Self-pay

## 2021-10-18 ENCOUNTER — Emergency Department (HOSPITAL_COMMUNITY): Payer: Medicare HMO

## 2021-10-18 ENCOUNTER — Inpatient Hospital Stay (HOSPITAL_COMMUNITY)
Admission: EM | Admit: 2021-10-18 | Discharge: 2021-10-22 | DRG: 536 | Disposition: A | Discharge status: Hospice | Payer: Medicare HMO | Source: Skilled Nursing Facility | Attending: Internal Medicine | Admitting: Internal Medicine

## 2021-10-18 DIAGNOSIS — F05 Delirium due to known physiological condition: Secondary | ICD-10-CM | POA: Diagnosis not present

## 2021-10-18 DIAGNOSIS — Z79890 Hormone replacement therapy (postmenopausal): Secondary | ICD-10-CM

## 2021-10-18 DIAGNOSIS — Z96649 Presence of unspecified artificial hip joint: Secondary | ICD-10-CM

## 2021-10-18 DIAGNOSIS — Y92129 Unspecified place in nursing home as the place of occurrence of the external cause: Secondary | ICD-10-CM | POA: Diagnosis not present

## 2021-10-18 DIAGNOSIS — I959 Hypotension, unspecified: Secondary | ICD-10-CM | POA: Diagnosis not present

## 2021-10-18 DIAGNOSIS — Z79899 Other long term (current) drug therapy: Secondary | ICD-10-CM

## 2021-10-18 DIAGNOSIS — Z515 Encounter for palliative care: Secondary | ICD-10-CM | POA: Diagnosis not present

## 2021-10-18 DIAGNOSIS — I10 Essential (primary) hypertension: Secondary | ICD-10-CM | POA: Diagnosis present

## 2021-10-18 DIAGNOSIS — S7001XA Contusion of right hip, initial encounter: Secondary | ICD-10-CM | POA: Diagnosis present

## 2021-10-18 DIAGNOSIS — S32411A Displaced fracture of anterior wall of right acetabulum, initial encounter for closed fracture: Secondary | ICD-10-CM | POA: Diagnosis not present

## 2021-10-18 DIAGNOSIS — S32421A Displaced fracture of posterior wall of right acetabulum, initial encounter for closed fracture: Principal | ICD-10-CM | POA: Diagnosis present

## 2021-10-18 DIAGNOSIS — M9701XA Periprosthetic fracture around internal prosthetic right hip joint, initial encounter: Secondary | ICD-10-CM | POA: Diagnosis not present

## 2021-10-18 DIAGNOSIS — F0390 Unspecified dementia without behavioral disturbance: Secondary | ICD-10-CM | POA: Diagnosis not present

## 2021-10-18 DIAGNOSIS — R627 Adult failure to thrive: Secondary | ICD-10-CM | POA: Diagnosis present

## 2021-10-18 DIAGNOSIS — H401133 Primary open-angle glaucoma, bilateral, severe stage: Secondary | ICD-10-CM | POA: Diagnosis not present

## 2021-10-18 DIAGNOSIS — M9711XA Periprosthetic fracture around internal prosthetic right knee joint, initial encounter: Secondary | ICD-10-CM | POA: Diagnosis not present

## 2021-10-18 DIAGNOSIS — Z96641 Presence of right artificial hip joint: Secondary | ICD-10-CM | POA: Diagnosis present

## 2021-10-18 DIAGNOSIS — W050XXA Fall from non-moving wheelchair, initial encounter: Secondary | ICD-10-CM | POA: Diagnosis present

## 2021-10-18 DIAGNOSIS — F0393 Unspecified dementia, unspecified severity, with mood disturbance: Secondary | ICD-10-CM | POA: Diagnosis not present

## 2021-10-18 DIAGNOSIS — S32401A Unspecified fracture of right acetabulum, initial encounter for closed fracture: Secondary | ICD-10-CM | POA: Diagnosis not present

## 2021-10-18 DIAGNOSIS — Z881 Allergy status to other antibiotic agents status: Secondary | ICD-10-CM

## 2021-10-18 DIAGNOSIS — R269 Unspecified abnormalities of gait and mobility: Secondary | ICD-10-CM | POA: Diagnosis present

## 2021-10-18 DIAGNOSIS — M2550 Pain in unspecified joint: Secondary | ICD-10-CM | POA: Diagnosis not present

## 2021-10-18 DIAGNOSIS — Z888 Allergy status to other drugs, medicaments and biological substances status: Secondary | ICD-10-CM

## 2021-10-18 DIAGNOSIS — D62 Acute posthemorrhagic anemia: Secondary | ICD-10-CM | POA: Diagnosis present

## 2021-10-18 DIAGNOSIS — F03911 Unspecified dementia, unspecified severity, with agitation: Secondary | ICD-10-CM | POA: Diagnosis not present

## 2021-10-18 DIAGNOSIS — Z7982 Long term (current) use of aspirin: Secondary | ICD-10-CM | POA: Diagnosis not present

## 2021-10-18 DIAGNOSIS — W19XXXA Unspecified fall, initial encounter: Secondary | ICD-10-CM | POA: Diagnosis not present

## 2021-10-18 DIAGNOSIS — R410 Disorientation, unspecified: Secondary | ICD-10-CM | POA: Diagnosis present

## 2021-10-18 DIAGNOSIS — Z885 Allergy status to narcotic agent status: Secondary | ICD-10-CM

## 2021-10-18 DIAGNOSIS — S72031D Displaced midcervical fracture of right femur, subsequent encounter for closed fracture with routine healing: Secondary | ICD-10-CM | POA: Diagnosis not present

## 2021-10-18 DIAGNOSIS — Z20822 Contact with and (suspected) exposure to covid-19: Secondary | ICD-10-CM | POA: Diagnosis present

## 2021-10-18 DIAGNOSIS — R748 Abnormal levels of other serum enzymes: Secondary | ICD-10-CM | POA: Diagnosis present

## 2021-10-18 DIAGNOSIS — M8580 Other specified disorders of bone density and structure, unspecified site: Secondary | ICD-10-CM | POA: Diagnosis present

## 2021-10-18 DIAGNOSIS — Z66 Do not resuscitate: Secondary | ICD-10-CM | POA: Diagnosis not present

## 2021-10-18 DIAGNOSIS — Z7401 Bed confinement status: Secondary | ICD-10-CM

## 2021-10-18 DIAGNOSIS — E039 Hypothyroidism, unspecified: Secondary | ICD-10-CM | POA: Diagnosis not present

## 2021-10-18 DIAGNOSIS — M978XXA Periprosthetic fracture around other internal prosthetic joint, initial encounter: Secondary | ICD-10-CM

## 2021-10-18 DIAGNOSIS — T84020A Dislocation of internal right hip prosthesis, initial encounter: Secondary | ICD-10-CM | POA: Diagnosis not present

## 2021-10-18 DIAGNOSIS — S72001D Fracture of unspecified part of neck of right femur, subsequent encounter for closed fracture with routine healing: Secondary | ICD-10-CM

## 2021-10-18 DIAGNOSIS — Z471 Aftercare following joint replacement surgery: Secondary | ICD-10-CM | POA: Diagnosis not present

## 2021-10-18 LAB — CBC WITH DIFFERENTIAL/PLATELET
Abs Immature Granulocytes: 0.06 10*3/uL (ref 0.00–0.07)
Basophils Absolute: 0 10*3/uL (ref 0.0–0.1)
Basophils Relative: 0 %
Eosinophils Absolute: 0.1 10*3/uL (ref 0.0–0.5)
Eosinophils Relative: 1 %
HCT: 30.3 % — ABNORMAL LOW (ref 36.0–46.0)
Hemoglobin: 9.6 g/dL — ABNORMAL LOW (ref 12.0–15.0)
Immature Granulocytes: 1 %
Lymphocytes Relative: 12 %
Lymphs Abs: 1.3 10*3/uL (ref 0.7–4.0)
MCH: 31.1 pg (ref 26.0–34.0)
MCHC: 31.7 g/dL (ref 30.0–36.0)
MCV: 98.1 fL (ref 80.0–100.0)
Monocytes Absolute: 0.8 10*3/uL (ref 0.1–1.0)
Monocytes Relative: 7 %
Neutro Abs: 8.7 10*3/uL — ABNORMAL HIGH (ref 1.7–7.7)
Neutrophils Relative %: 79 %
Platelets: 356 10*3/uL (ref 150–400)
RBC: 3.09 MIL/uL — ABNORMAL LOW (ref 3.87–5.11)
RDW: 17.1 % — ABNORMAL HIGH (ref 11.5–15.5)
WBC: 11.1 10*3/uL — ABNORMAL HIGH (ref 4.0–10.5)
nRBC: 0 % (ref 0.0–0.2)

## 2021-10-18 LAB — URINALYSIS, ROUTINE W REFLEX MICROSCOPIC
Bilirubin Urine: NEGATIVE
Glucose, UA: NEGATIVE mg/dL
Ketones, ur: 5 mg/dL — AB
Leukocytes,Ua: NEGATIVE
Nitrite: NEGATIVE
Protein, ur: 30 mg/dL — AB
Specific Gravity, Urine: 1.026 (ref 1.005–1.030)
pH: 5 (ref 5.0–8.0)

## 2021-10-18 LAB — COMPREHENSIVE METABOLIC PANEL
ALT: 15 U/L (ref 0–44)
AST: 21 U/L (ref 15–41)
Albumin: 3.2 g/dL — ABNORMAL LOW (ref 3.5–5.0)
Alkaline Phosphatase: 157 U/L — ABNORMAL HIGH (ref 38–126)
Anion gap: 6 (ref 5–15)
BUN: 15 mg/dL (ref 8–23)
CO2: 26 mmol/L (ref 22–32)
Calcium: 8.3 mg/dL — ABNORMAL LOW (ref 8.9–10.3)
Chloride: 103 mmol/L (ref 98–111)
Creatinine, Ser: 0.31 mg/dL — ABNORMAL LOW (ref 0.44–1.00)
GFR, Estimated: >60 mL/min (ref >60)
Glucose, Bld: 107 mg/dL — ABNORMAL HIGH (ref 70–99)
Potassium: 3.5 mmol/L (ref 3.5–5.1)
Sodium: 135 mmol/L (ref 135–145)
Total Bilirubin: 0.3 mg/dL (ref 0.3–1.2)
Total Protein: 7.2 g/dL (ref 6.5–8.1)

## 2021-10-18 LAB — RESP PANEL BY RT-PCR (FLU A&B, COVID) ARPGX2
Influenza A by PCR: NEGATIVE
Influenza B by PCR: NEGATIVE
SARS Coronavirus 2 by RT PCR: NEGATIVE

## 2021-10-18 MED ORDER — VITAMIN B-12 1000 MCG PO TABS
1000.0000 ug | ORAL_TABLET | Freq: Every day | ORAL | Status: DC
  Administered 2021-10-18 – 2021-10-20 (×3): 1000 ug via ORAL
  Filled 2021-10-18 (×4): qty 1

## 2021-10-18 MED ORDER — ENOXAPARIN SODIUM 40 MG/0.4ML IJ SOSY: 40.0000 mg | PREFILLED_SYRINGE | INTRAMUSCULAR | Status: DC

## 2021-10-18 MED ORDER — LACTATED RINGERS IV SOLN: INTRAVENOUS | Status: DC

## 2021-10-18 MED ORDER — LORAZEPAM 2 MG/ML IJ SOLN
0.5000 mg | Freq: Once | INTRAMUSCULAR | Status: AC
  Administered 2021-10-18: 0.5 mg via INTRAVENOUS
  Filled 2021-10-18: qty 1

## 2021-10-18 MED ORDER — ACETAMINOPHEN 500 MG PO TABS
1000.0000 mg | ORAL_TABLET | Freq: Four times a day (QID) | ORAL | Status: DC
Start: 2021-10-18 — End: 2021-10-21
  Administered 2021-10-18 – 2021-10-21 (×3): 1000 mg via ORAL
  Filled 2021-10-18 (×10): qty 2

## 2021-10-18 MED ORDER — LEVOTHYROXINE SODIUM 100 MCG PO TABS
100.0000 ug | ORAL_TABLET | Freq: Every day | ORAL | Status: DC
  Administered 2021-10-19 – 2021-10-21 (×3): 100 ug via ORAL
  Filled 2021-10-18 (×4): qty 1

## 2021-10-18 MED ORDER — SENNA 8.6 MG PO TABS
1.0000 | ORAL_TABLET | Freq: Two times a day (BID) | ORAL | Status: DC
  Administered 2021-10-18 – 2021-10-21 (×6): 8.6 mg via ORAL
  Filled 2021-10-18 (×6): qty 1

## 2021-10-18 MED ORDER — MORPHINE SULFATE (PF) 2 MG/ML IV SOLN: 0.5000 mg | INTRAVENOUS | Status: DC | PRN

## 2021-10-18 MED ORDER — OXYCODONE HCL 5 MG PO TABS: 5.0000 mg | ORAL_TABLET | Freq: Four times a day (QID) | ORAL | Status: DC | PRN

## 2021-10-18 MED ORDER — HYDROMORPHONE HCL 1 MG/ML IJ SOLN
0.5000 mg | INTRAMUSCULAR | Status: DC | PRN
  Administered 2021-10-19: 0.5 mg via INTRAVENOUS
  Filled 2021-10-18: qty 0.5

## 2021-10-18 MED ORDER — HYDROCODONE-ACETAMINOPHEN 5-325 MG PO TABS: 1.0000 | ORAL_TABLET | Freq: Four times a day (QID) | ORAL | Status: DC | PRN

## 2021-10-18 MED ORDER — ACETAMINOPHEN 325 MG PO TABS: 650.0000 mg | ORAL_TABLET | Freq: Four times a day (QID) | ORAL | Status: DC | PRN

## 2021-10-18 NOTE — H&P (Addendum)
History and Physical  Linda Copeland NHA:[id number] DOB: Jan 30, 1931 DOA: 10/18/2021  Referring physician:  Marlou Starks PCP: Gaynelle Arabian, MD  Outpatient Specialists: Orthopedic surgery, Dr. Lyla Glassing. Patient coming from: SNF  Chief Complaint: Right hip deformity and pain.  HPI: Linda Copeland is a 88 y.o. female with medical history significant for dementia, hypertension, osteopenia, vertebral compression fracture, severe glaucoma, hypothyroidism, R femoral neck fracture s/p repair on 09/26/21 by Dr. Lyla Glassing,  who presented to [facility] ED from SNF due to right hip pain and deformity.  Patient is unable to provide a history due to advanced dementia.  History is mainly obtained from EDP, patient's son at bedside, and from review of medical records.  Patient fell at SNF from wheelchair to ground-level on 10/16/2021.  She went to her orthopedic surgeon today for a routine post surgical appointment and was noted to have right hip deformity.  A right hip x-ray was done and it showed right acetabulum fracture.  She was sent to the ED for further evaluation.    EDP discussed case with orthopedic surgery, plans to have surgical repair on Thursday, 10/20/2021.  TRH, hospitalist team, was asked to admit.  Orthopedic surgery will see in consultation.  ED Course: Temperature 98.2.  BP 116/55, pulse 78, respiration rate 22, with saturation 95% on room air.  UA is pending.  CMP essentially unremarkable except for elevated alkaline phosphatase 157.  CBC unremarkable except for elevated WBC 11.1, hemoglobin 9.6 close to baseline.  Review of Systems: Review of systems as noted in the HPI. All other systems reviewed and are negative.   Past Medical History:  Diagnosis Date   Arthritis    bilateral severe facet L4-L5, L5-S1   Carpal tunnel syndrome of right wrist    Chronic cervical radiculopathy    C5   Compression fracture of lumbar vertebra (HCC)    L2-L3   Environmental and seasonal allergies     Frequent urination    Hematoma of frontal scalp 10/06/2016   History of bronchitis    History of humerus fracture 10/06/2016   Right proximal   History of subdural hematoma 10/06/2016   Left falcotentorial    HOH (hard of hearing)    Hypertension    Hypothyroidism    Nasal fracture 10/06/2016   OA (osteoarthritis)    AC glenohumeral joinr   Osteopenia    Spinal stenosis at L4-L5 level    Past Surgical History:  Procedure Laterality Date   CARPAL TUNNEL RELEASE Right 01/22/2018   Procedure: RIGHT CARPAL TUNNEL RELEASE;  Surgeon: Daryll Brod, MD;  Location: [facility];  Service: Orthopedics;  Laterality: Right;   COLONOSCOPY     TOTAL HIP ARTHROPLASTY Right 09/26/2021   Procedure: TOTAL HIP ARTHROPLASTY ANTERIOR APPROACH;  Surgeon: Rod Can, MD;  Location: [facility];  Service: Orthopedics;  Laterality: Right;    Social History:  reports that she has never smoked. She has never used smokeless tobacco. She reports that she does not currently use alcohol. She reports that she does not use drugs.   Allergies  Allergen Reactions   Codeine Nausea And Vomiting and Other (See Comments)    uknown   Biaxin [Clarithromycin] Other (See Comments)    Sweats   Caffeine     Shaky    Family History  Problem Relation Age of Onset   Diabetes Son       Prior to Admission medications   Medication Sig Start Date End Date Taking? Authorizing Provider  acetaminophen (TYLENOL) 325  MG tablet Take 2 tablets (650 mg total) by mouth every 6 (six) hours as needed for mild pain, fever or headache. 09/29/21 09/29/22  Mercy Riding, MD  aspirin (ASPIRIN CHILDRENS) 81 MG chewable tablet Chew 1 tablet (81 mg total) by mouth 2 (two) times daily. 09/27/21 11/08/21  Cherlynn June B, PA  dorzolamide (TRUSOPT) 2 % ophthalmic solution Place 1 drop into the right eye 2 (two) times daily. 08/06/21   [provider]  HYDROcodone-acetaminophen (NORCO/VICODIN) 5-325 MG tablet Take 1-2  tablets by mouth every 4 (four) hours as needed for moderate pain (pain score 4-6). 09/27/21   Dorothyann Peng, PA  ibandronate (BONIVA) 150 MG tablet Take 150 mg by mouth every 30 (thirty) days. 01/26/19   [provider]  levothyroxine (SYNTHROID, LEVOTHROID) 100 MCG tablet Take 100 mcg by mouth daily before breakfast. 09/09/16   [provider]  lisinopril (PRINIVIL,ZESTRIL) 20 MG tablet Take 20 mg by mouth daily. 09/09/16   [provider]  LUMIGAN 0.01 % SOLN Place 1 drop into both eyes at bedtime.  03/18/18   [provider]  ondansetron (ZOFRAN) 4 MG tablet Take 1 tablet (4 mg total) by mouth every 6 (six) hours as needed for nausea. 09/29/21   Mercy Riding, MD  polyethylene glycol (MIRALAX / GLYCOLAX) 17 g packet Take 17 g by mouth 2 (two) times daily as needed for mild constipation. 09/29/21   Mercy Riding, MD  senna (SENOKOT) 8.6 MG TABS tablet Take 1 tablet (8.6 mg total) by mouth 2 (two) times daily. 09/29/21   Mercy Riding, MD  vitamin B-12 (CYANOCOBALAMIN) 1000 MCG tablet Take 1 tablet (1,000 mcg total) by mouth daily. 09/30/21   Mercy Riding, MD    Physical Exam: BP (!) 116/55    Pulse 78    Temp 98.2 F (36.8 C) (Oral)    Resp (!) 22    Ht 5\' 1"  (1.549 m)    Wt 65 kg    SpO2 95%    BMI 27.08 kg/m   General: 88 y.o. year-old female well developed well nourished in no acute distress.  Alert and confused, restless. Cardiovascular: Regular rate and rhythm with no rubs or gallops.  No thyromegaly or JVD noted.  No lower extremity edema bilaterally. Respiratory: Clear to auscultation with no wheezes or rales. Good inspiratory effort. Abdomen: Soft nontender nondistended with normal bowel sounds x4 quadrants. Muskuloskeletal: No cyanosis, clubbing or edema noted bilaterally Neuro: CN II-XII intact, strength, sensation, reflexes Skin: No ulcerative lesions noted or rashes Psychiatry: Judgement and insight appear altered in the setting of advanced  dementia. Mood is appropriate for condition and setting          Labs on Admission:  Basic Metabolic Panel: Recent Labs  Lab 10/18/21 1323  NA 135  K 3.5  CL 103  CO2 26  GLUCOSE 107*  BUN 15  CREATININE 0.31*  CALCIUM 8.3*   Liver Function Tests: Recent Labs  Lab 10/18/21 1323  AST 21  ALT 15  ALKPHOS 157*  BILITOT 0.3  PROT 7.2  ALBUMIN 3.2*   No results for input(s): LIPASE, AMYLASE in the last 168 hours. No results for input(s): AMMONIA in the last 168 hours. CBC: Recent Labs  Lab 10/18/21 1323  WBC 11.1*  NEUTROABS 8.7*  HGB 9.6*  HCT 30.3*  MCV 98.1  PLT 356   Cardiac Enzymes: No results for input(s): CKTOTAL, CKMB, CKMBINDEX, TROPONINI in the last 168 hours.  BNP (  last 3 results) No results for input(s): BNP in the last 8760 hours.  ProBNP (last 3 results) No results for input(s): PROBNP in the last 8760 hours.  CBG: No results for input(s): GLUCAP in the last 168 hours.  Radiological Exams on Admission: CT HIP RIGHT WO CONTRAST  Result Date: 10/18/2021 CLINICAL DATA:  Hip surgical planning. Right hip shifting after replacement EXAM: CT OF THE RIGHT HIP WITHOUT CONTRAST TECHNIQUE: Multidetector CT imaging of the right hip was performed according to the standard protocol. Multiplanar CT image reconstructions were also generated. RADIATION DOSE REDUCTION: This exam was performed according to the departmental dose-optimization program which includes automated exposure control, adjustment of the mA and/or kV according to patient size and/or use of iterative reconstruction technique. COMPARISON:  None. FINDINGS: Bones/Joint/Cartilage There is fracture of the posterior acetabular wall with approximately 1.6 cm posterior displacement of the largest fracture fragment. There is superior posterior displacement of the femoral component which is dislocated out of the acetabular component. There is also mildly displaced fractures of the anterior acetabular wall  (series 3, image 53). There is likely intrapelvic migration of the acetabular component with adjacent acetabular roof fracture. Diffuse osteopenia. Ligaments Suboptimally assessed by CT. Muscles and Tendons Soft tissue swelling and likely intramuscular hematoma in the quadriceps femoris muscles measuring approximately 5.5 x 5.9 x 9.3 cm. There is also small hematoma about the posterior aspect of the hip joint. Soft tissues Small subcutaneous fluid collection/hematoma in the subcutaneous soft tissues of the anterior thigh measuring approximately 1.7 x 6.1 x 8.9 cm. IMPRESSION: 1. Status post right hip arthroplasty. Posterior acetabular wall fracture with dislocation of the femoral component out of the acetabulum and superior posterior displacement of the femoral head. 2. There is also fracture of the anterior acetabular wall. 3. Fracture of the acetabular roof with mild migration of the acetabular component into the pelvis. 4. Large anterior hip hematoma about the quadriceps muscles measuring at least 5.5 x 5.9 cm. Small hematoma about the posterior aspect of the acetabular fracture. 5. Subcutaneous soft tissue hematoma/seroma about the anterior aspect of the thigh. Electronically Signed   By: Keane Police D.O.   On: 10/18/2021 14:30   DG Knee Right Port  Result Date: 10/18/2021 CLINICAL DATA:  Periprosthetic fracture. EXAM: PORTABLE RIGHT KNEE - 1-2 VIEW COMPARISON:  Right knee radiographs 09/25/2021 FINDINGS: No acute fracture, dislocation, or knee joint effusion is identified. There is mild medial compartment joint space narrowing. Mild marginal spurring is noted in the medial compartment and along the patella. Atherosclerotic vascular calcifications are noted. IMPRESSION: No acute osseous abnormality identified. Electronically Signed   By: Logan Bores M.D.   On: 10/18/2021 13:46   DG Hip Unilat W or Wo Pelvis 2-3 Views Right  Result Date: 10/18/2021 CLINICAL DATA:  Total right hip arthroplasty  09/26/2021. Postop complication. EXAM: DG HIP (WITH OR WITHOUT PELVIS) 2-3V RIGHT COMPARISON:  09/26/2021 FINDINGS: The acetabular cup has migrated since the prior study. The cup has migrated inferiorly. There is now uncovering of the superior femoral head prosthesis. In addition, there is significant protrusio of the prosthesis which could be due to medial acetabular fracture. This was not present previously. Left hip joint normal.  No other pelvic lesion. IMPRESSION: Right hip replacement. Interval migration of the acetabular cup prosthesis inferiorly and medially. Probable fracture of the medial acetabulum. Recommend CT pelvis for further evaluation. Electronically Signed   By: Franchot Gallo M.D.   On: 10/18/2021 13:12    EKG: I independently viewed  the EKG done and my findings are as followed: None available at the time of this dictation.  Assessment/Plan Present on Admission:  Acetabulum fracture, right ([place])  Principal Problem:   Acetabulum fracture, right ([place])  Right acetabulum fracture post fall in the setting of prior hip replacement (09/26/2021) Reported fall from wheelchair on 10/16/2021 at SNF facility Sent to the ED from orthopedic surgery's office due to right acetabular fracture seen on x-ray. CT right hip without contrast done on 10/18/2021 showed posterior acetabular wall fracture with dislocation of the femoral component and of the acetabulum and superior posterior displacement of the femoral head.  Also fracture of the anterior acetabular wall.  Fracture of the acetabular roof with mild migration of the acetabular component into the pelvis.  Large anterior hip hematoma about the quadricep muscles measuring at least 5.5 x 5.9 cm.  Small hematoma about the posterior aspect of the acetabular fracture subcutaneous soft tissue hematoma seroma about the anterior aspect of the thigh. Pain management with bowel regimen Plan orthopedic surgical repair on Thursday, 10/20/2021 Make n.p.o.  after midnight for surgery  Large anterior hip hematoma about the quadricep muscles measuring at least 5.5 x 5.9 cm, seen on CT scan.   Small hematoma about the posterior aspect of the acetabular fracture subcutaneous soft tissue hematoma seroma about the anterior aspect of the thigh. DVT pharmacological prophylaxis on hold due to large hematoma SCDs for DVT prophylaxis. Monitor H&H  Delirium with restlessness Restless in the ED at the time of this visit Baseline dementia Treat underlying condition Follow UA, treat if indicated Gentle IV fluid hydration to avoid dehydration Treat pain Reorient as needed Fall precautions/aspiration precautions/delirium precautions  Elevated alkaline phosphatase in the setting of bone fracture Monitor for now Gentle IV fluid hydration  Chronic normocytic anemia Appears to be at her baseline hemoglobin 9.6 No overt bleeding Monitor H&H  Hypothyroidism Resume home levothyroxine  Hypertension BP is at goal Continue to monitor vital signs  Ambulatory dysfunction with fall UA pending Plan for surgical repair as stated above Fall precautions  Goals of care Patient is full code Palliative care team consulted to assist with establishing goals of care.    DVT prophylaxis: SCDs.  Pharmacological DVT prophylaxis held due to large anterior hip hematoma seen on CT scan.   Defer VTE prophylaxis to orthopedic surgery.   Code Status: Full code  Family Communication: Son at bedside  Disposition Plan: Admitted to telemetry unit  Consults called: Orthopedic surgery consulted by EDP  Admission status: Inpatient   Status is: Inpatient  Patient requires at least 2 midnights for further evaluation and treatment of present condition.       Kayleen Memos MD [facility] Pager [phone]  If 7PM-7AM, please contact night-coverage [email or web address] Password [facility]  10/18/2021, 2:33 PM

## 2021-10-18 NOTE — Progress Notes (Signed)
Palliative care consult order received. Chart reviewed. Urgent need for goals of care discussion since she is currently a full code and it is unclear what the goal of surgical intervention will be in this already mostly bed-bound fully dependent woman with advance age and progressive dementia with significant delirium. We can control her pain and provide comfort care without surgical intervention if this is desired by family-it should be offered at minimum as a reasonable approach to care in this situation.Will place order for scheduled Tylenol at minimum until she can be seen by our team.  [name], DO Palliative Medicine

## 2021-10-18 NOTE — ED Notes (Signed)
Mits placed on pt.  Pt actively pulling at lines, IV, and medical equipment. Pt confused.

## 2021-10-18 NOTE — ED Provider Notes (Signed)
[place] DEPT Provider Note   CSN: [id number] Arrival date & time: 10/18/21  1142     History  Chief Complaint  Patient presents with   Hip Pain    Linda Copeland is a 88 y.o. female with past medical history of suspected dementia, hypertension, osteopenia with vertebral compression fractures, severe glaucoma, hypothyroidism.  Patient is alert to person only.  Level 5 caveat applies.  Per chart review patient was seen on 12/25 after suffering an unwitnessed fall at home on 12/24.  Patient was found to have impacted right femoral neck fracture.  Patient had total right hip arthroplasty performed by Dr. Lyla Glassing on 12/26.  Per chart review patient was seen by Dr. Lyla Glassing at [facility] earlier today.  Patient was sent to the emergency department due to appearance of right hip shifting after replacement.  Patient is a resident of [facility].   Hip Pain      Home Medications Prior to Admission medications   Medication Sig Start Date End Date Taking? Authorizing Provider  acetaminophen (TYLENOL) 325 MG tablet Take 2 tablets (650 mg total) by mouth every 6 (six) hours as needed for mild pain, fever or headache. 09/29/21 09/29/22  Mercy Riding, MD  aspirin (ASPIRIN CHILDRENS) 81 MG chewable tablet Chew 1 tablet (81 mg total) by mouth 2 (two) times daily. 09/27/21 11/08/21  Cherlynn June B, PA  dorzolamide (TRUSOPT) 2 % ophthalmic solution Place 1 drop into the right eye 2 (two) times daily. 08/06/21   [provider]  HYDROcodone-acetaminophen (NORCO/VICODIN) 5-325 MG tablet Take 1-2 tablets by mouth every 4 (four) hours as needed for moderate pain (pain score 4-6). 09/27/21   Dorothyann Peng, PA  ibandronate (BONIVA) 150 MG tablet Take 150 mg by mouth every 30 (thirty) days. 01/26/19   [provider]  levothyroxine (SYNTHROID, LEVOTHROID) 100 MCG tablet Take 100 mcg by mouth daily before breakfast. 09/09/16    [provider]  lisinopril (PRINIVIL,ZESTRIL) 20 MG tablet Take 20 mg by mouth daily. 09/09/16   [provider]  LUMIGAN 0.01 % SOLN Place 1 drop into both eyes at bedtime.  03/18/18   [provider]  ondansetron (ZOFRAN) 4 MG tablet Take 1 tablet (4 mg total) by mouth every 6 (six) hours as needed for nausea. 09/29/21   Mercy Riding, MD  polyethylene glycol (MIRALAX / GLYCOLAX) 17 g packet Take 17 g by mouth 2 (two) times daily as needed for mild constipation. 09/29/21   Mercy Riding, MD  senna (SENOKOT) 8.6 MG TABS tablet Take 1 tablet (8.6 mg total) by mouth 2 (two) times daily. 09/29/21   Mercy Riding, MD  vitamin B-12 (CYANOCOBALAMIN) 1000 MCG tablet Take 1 tablet (1,000 mcg total) by mouth daily. 09/30/21   Mercy Riding, MD      Allergies    Codeine, Biaxin [clarithromycin], and Caffeine    Review of Systems   Review of Systems  Unable to perform ROS: Dementia   Physical Exam Updated Vital Signs Pulse 78    Temp 98.2 F (36.8 C) (Oral)    Resp 18    Ht 5' 1"  (1.549 m)    Wt 65 kg    SpO2 97%    BMI 27.08 kg/m  Physical Exam Vitals and nursing note reviewed.  Constitutional:      General: She is not in acute distress.    Appearance: She is not ill-appearing, toxic-appearing or diaphoretic.  HENT:  Head: Normocephalic.  Eyes:     General: No scleral icterus.       Right eye: No discharge.        Left eye: No discharge.  Cardiovascular:     Rate and Rhythm: Normal rate.     Pulses:          Dorsalis pedis pulses are 2+ on the right side and 2+ on the left side.  Pulmonary:     Effort: Pulmonary effort is normal.  Musculoskeletal:     Right hip: No deformity, lacerations, tenderness, bony tenderness or crepitus.     Left hip: No deformity, lacerations, tenderness, bony tenderness or crepitus.     Right upper leg: Swelling and laceration present. No edema, deformity, tenderness or bony tenderness.     Left upper leg: No swelling, edema,  deformity, lacerations, tenderness or bony tenderness.     Right knee: No swelling, deformity, effusion, erythema, ecchymosis, lacerations, bony tenderness or crepitus. No tenderness. Normal alignment.     Left knee: No swelling, deformity, effusion, erythema, ecchymosis, lacerations, bony tenderness or crepitus. No tenderness. Normal alignment.     Right lower leg: Normal.     Left lower leg: Normal.     Right ankle: No swelling, deformity, ecchymosis or lacerations. No tenderness. Normal range of motion.     Left ankle: No swelling, deformity, ecchymosis or lacerations. No tenderness. Normal range of motion.     Right foot: Normal capillary refill. No swelling, deformity, laceration, tenderness, bony tenderness or crepitus.     Left foot: Normal capillary refill. No swelling, deformity, laceration, tenderness, bony tenderness or crepitus.     Comments: Incision to right hip is clean, dry, and intact.  Patient has significant swelling surrounding incision site.  No erythema, rash, or purulent discharge.  No tenderness to palpation.  No shortening or rotation of right leg.  Skin:    General: Skin is warm and dry.  Neurological:     General: No focal deficit present.     Mental Status: She is alert.  Psychiatric:        Behavior: Behavior is cooperative.    ED Results / Procedures / Treatments   Labs (all labs ordered are listed, but only abnormal results are displayed) Labs Reviewed  COMPREHENSIVE METABOLIC PANEL - Abnormal; Notable for the following components:      Result Value   Glucose, Bld 107 (*)    Creatinine, Ser 0.31 (*)    Calcium 8.3 (*)    Albumin 3.2 (*)    Alkaline Phosphatase 157 (*)    All other components within normal limits  CBC WITH DIFFERENTIAL/PLATELET - Abnormal; Notable for the following components:   WBC 11.1 (*)    RBC 3.09 (*)    Hemoglobin 9.6 (*)    HCT 30.3 (*)    RDW 17.1 (*)    Neutro Abs 8.7 (*)    All other components within normal limits  RESP  PANEL BY RT-PCR (FLU A&B, COVID) [id number]  URINALYSIS, ROUTINE W REFLEX MICROSCOPIC  TYPE AND SCREEN    EKG None  Radiology CT HIP RIGHT WO CONTRAST  Result Date: 10/18/2021 CLINICAL DATA:  Hip surgical planning. Right hip shifting after replacement EXAM: CT OF THE RIGHT HIP WITHOUT CONTRAST TECHNIQUE: Multidetector CT imaging of the right hip was performed according to the standard protocol. Multiplanar CT image reconstructions were also generated. RADIATION DOSE REDUCTION: This exam was performed according to the departmental dose-optimization program which includes automated exposure control,  adjustment of the mA and/or kV according to patient size and/or use of iterative reconstruction technique. COMPARISON:  None. FINDINGS: Bones/Joint/Cartilage There is fracture of the posterior acetabular wall with approximately 1.6 cm posterior displacement of the largest fracture fragment. There is superior posterior displacement of the femoral component which is dislocated out of the acetabular component. There is also mildly displaced fractures of the anterior acetabular wall (series 3, image 53). There is likely intrapelvic migration of the acetabular component with adjacent acetabular roof fracture. Diffuse osteopenia. Ligaments Suboptimally assessed by CT. Muscles and Tendons Soft tissue swelling and likely intramuscular hematoma in the quadriceps femoris muscles measuring approximately 5.5 x 5.9 x 9.3 cm. There is also small hematoma about the posterior aspect of the hip joint. Soft tissues Small subcutaneous fluid collection/hematoma in the subcutaneous soft tissues of the anterior thigh measuring approximately 1.7 x 6.1 x 8.9 cm. IMPRESSION: 1. Status post right hip arthroplasty. Posterior acetabular wall fracture with dislocation of the femoral component out of the acetabulum and superior posterior displacement of the femoral head. 2. There is also fracture of the anterior acetabular wall. 3. Fracture of  the acetabular roof with mild migration of the acetabular component into the pelvis. 4. Large anterior hip hematoma about the quadriceps muscles measuring at least 5.5 x 5.9 cm. Small hematoma about the posterior aspect of the acetabular fracture. 5. Subcutaneous soft tissue hematoma/seroma about the anterior aspect of the thigh. Electronically Signed   By: Keane Police D.O.   On: 10/18/2021 14:30   DG Knee Right Port  Result Date: 10/18/2021 CLINICAL DATA:  Periprosthetic fracture. EXAM: PORTABLE RIGHT KNEE - 1-2 VIEW COMPARISON:  Right knee radiographs 09/25/2021 FINDINGS: No acute fracture, dislocation, or knee joint effusion is identified. There is mild medial compartment joint space narrowing. Mild marginal spurring is noted in the medial compartment and along the patella. Atherosclerotic vascular calcifications are noted. IMPRESSION: No acute osseous abnormality identified. Electronically Signed   By: Logan Bores M.D.   On: 10/18/2021 13:46   DG Hip Unilat W or Wo Pelvis 2-3 Views Right  Result Date: 10/18/2021 CLINICAL DATA:  Total right hip arthroplasty 09/26/2021. Postop complication. EXAM: DG HIP (WITH OR WITHOUT PELVIS) 2-3V RIGHT COMPARISON:  09/26/2021 FINDINGS: The acetabular cup has migrated since the prior study. The cup has migrated inferiorly. There is now uncovering of the superior femoral head prosthesis. In addition, there is significant protrusio of the prosthesis which could be due to medial acetabular fracture. This was not present previously. Left hip joint normal.  No other pelvic lesion. IMPRESSION: Right hip replacement. Interval migration of the acetabular cup prosthesis inferiorly and medially. Probable fracture of the medial acetabulum. Recommend CT pelvis for further evaluation. Electronically Signed   By: Franchot Gallo M.D.   On: 10/18/2021 13:12    Procedures Procedures    Medications Ordered in ED   ED Course/ Medical Decision Making/ A&P Clinical Course as of  10/18/21 1610  Tue Oct 18, 2021  1218 I spoke to orthopedic provider Dr. Lyla Glassing with [facility] who advised that he was told by patient's son that patient suffered a fall 2 days prior at her rehab facility.  Since then patient has been refusing to walk.  X-ray imaging was performed earlier today which showed periacetabular fracture.  Dr. Lyla Glassing advised that patient will need to be admitted to hospital service and plans to perform surgery on Thursday.  Advised to order basic lab work and type and screen. [PB]  [id number] GNFAO to  Dr. Nevada Crane who will see the patient for admission. [PB]    Clinical Course User Index [PB] Loni Beckwith, PA-C                           Medical Decision Making Amount and/or Complexity of Data Reviewed Labs: ordered. Radiology: ordered.  Risk Prescription drug management. Decision regarding hospitalization.   This patient presents to the ED for concern of right hip pain, this involves an extensive number of treatment options, and is a complaint that carries with it a high risk of complications and morbidity.  The differential diagnosis includes but is not limited to hip fracture, hip dislocation, septic arthritis, musculoskeletal pain.   Co morbidities that complicate the patient evaluation  Dementia, hypertension   Additional history obtained:  Additional history obtained from SNF facility staff External records from outside source obtained and reviewed including previous provider notes, lab work, and imaging   Lab Tests:  I Ordered, and personally interpreted labs.  The pertinent results include:   CMP shows alk phos elevated at 157, this is new from previous lab results Slight leukocytosis at 11.1 Anemia however appears baseline due to patient's previous lab results   Imaging Studies ordered:  I ordered imaging studies including x-ray imaging right hip, x-ray of the right knee, noncontrast CT right hip I independently visualized and  interpreted x-ray imaging which showed no acute osseous abnormality right knee.  Right hip replacement, lateral migration of the acetabular cup prosthesis inferior medially. I agree with the radiologist interpretation CT Imaging was pending at time of consult with hospitalist for admission   Medicines ordered and prescription drug management:  I ordered medication including Ativan for agitation Reevaluation of the patient after these medicines showed that the patient stayed the same I have reviewed the patients home medicines and have made adjustments as needed  Consultations Obtained:  I requested consultation with the on-call [facility] orthopedic provider Dr. Lyla Glassing,  and discussed lab and imaging findings as well as pertinent plan - they recommend: Admission, obtaining type and screen, basic lab work, COVID-19 testing, and CT imaging of right hip.  Plan is for surgery on Thursday.   Problem List / ED Course:  Right hip deformity Patient had total right hip arthroplasty performed on 12/26 by Dr. Lyla Glassing after suffering a right femoral neck fracture. Facility staff reports that patient suffered a fall from her wheelchair to ground level on Sunday 1/15.  Patient went to orthopedic provider today for routine follow-up after her surgery.  X-ray there was concerning for fracture.  Patient was sent to the emergency department for further work-up. Patient noted to have deformity on right hip.  No tenderness on exam.  Patient unable to lift leg.  Pulse, motor, and sensation intact distally. Patient has history of dementia and is unable to provide any information.  Level 5 caveat applies. I spoke with Dr. Lyla Glassing who advised patient will need to be admitted with likely surgery on Thursday. I spoke with hospitalist Dr. Nevada Crane who will see the patient for admission.   Reevaluation:  After the interventions noted above, I reevaluated the patient and found that they have :stayed the  same   Disposition:  After consideration of the diagnostic results and the patients response to treatment, I feel that the patent would benefit from admission.          Final Clinical Impression(s) / ED Diagnoses Final diagnoses:  Closed nondisplaced fracture of  right acetabulum, unspecified portion of acetabulum, initial encounter [facility])    Rx / DC Orders ED Discharge Orders     None         Dyann Ruddle 10/18/21 1618    Valarie Merino, MD 10/19/21 2253

## 2021-10-18 NOTE — ED Notes (Signed)
Pt given gramcrackers and apple juice.  Son at bedside

## 2021-10-18 NOTE — ED Notes (Signed)
Pt has noted obvious deformity to right hip upon removal of pt's pants.

## 2021-10-18 NOTE — ED Triage Notes (Signed)
Pt bib ems from [facility], pt from [facility] and [facility].  Pt sent from [facility] d/t appearance of right hip shifting after replacement.

## 2021-10-18 NOTE — ED Notes (Signed)
ED RN called [facility] to talk w/ nurse caring for pt to find out more information on pt.  ED RN spoke w/ [facility] unit manager who was unable/unwilling to answer ED RN's questions.  ED RN was then transferred to Billey Chang Director by Doctor, hospital.  Sharyn Lull reports pt had fall from wheelchair to floor on Sunday (10/16/21) between 10:30-11:00am.  Per Sharyn Lull, Physical Therapy documentation on Monday (10/17/21), pt did not complain of any pain and was able to complete her normal routine physical therapy.  Per PT documentation, pt was able to bear weight on right leg during transfer from wheelchair to bed.  Pt is reported by PT to be pleasantly confused during PT session.  Sharyn Lull reports she is currently investigating the fall and the care the patient received post fall. Per Sharyn Lull, there is no documentation of pt complaining of pain or any pain medications given to patient.  (NOTE: pt confused and combative upon arrival to ed, Pt orientated to self only upon arrival to ed)      Pt was sent to [facility] today for routine appointment follow right hip replacement.   Nelda Bucks PA notified and aware of ED RN's conversation w/ Sharyn Lull and aware of pt's fall on Sunday.

## 2021-10-19 DIAGNOSIS — F0390 Unspecified dementia without behavioral disturbance: Secondary | ICD-10-CM | POA: Diagnosis present

## 2021-10-19 DIAGNOSIS — E039 Hypothyroidism, unspecified: Secondary | ICD-10-CM

## 2021-10-19 DIAGNOSIS — H401133 Primary open-angle glaucoma, bilateral, severe stage: Secondary | ICD-10-CM

## 2021-10-19 DIAGNOSIS — I10 Essential (primary) hypertension: Secondary | ICD-10-CM

## 2021-10-19 LAB — BASIC METABOLIC PANEL
Anion gap: 5 (ref 5–15)
BUN: 11 mg/dL (ref 8–23)
CO2: 25 mmol/L (ref 22–32)
Calcium: 7.8 mg/dL — ABNORMAL LOW (ref 8.9–10.3)
Chloride: 107 mmol/L (ref 98–111)
Creatinine, Ser: 0.36 mg/dL — ABNORMAL LOW (ref 0.44–1.00)
GFR, Estimated: >60 mL/min (ref >60)
Glucose, Bld: 91 mg/dL (ref 70–99)
Potassium: 3.8 mmol/L (ref 3.5–5.1)
Sodium: 137 mmol/L (ref 135–145)

## 2021-10-19 LAB — RETICULOCYTES
Immature Retic Fract: 14.8 % (ref 2.3–15.9)
RBC.: 2.92 MIL/uL — ABNORMAL LOW (ref 3.87–5.11)
Retic Count, Absolute: 110.1 10*3/uL (ref 19.0–186.0)
Retic Ct Pct: 3.8 % — ABNORMAL HIGH (ref 0.4–3.1)

## 2021-10-19 LAB — IRON AND TIBC
Iron: 58 ug/dL (ref 28–170)
Saturation Ratios: 24 % (ref 10.4–31.8)
TIBC: 237 ug/dL — ABNORMAL LOW (ref 250–450)
UIBC: 179 ug/dL

## 2021-10-19 LAB — PREPARE RBC (CROSSMATCH)

## 2021-10-19 LAB — CBC
HCT: 29 % — ABNORMAL LOW (ref 36.0–46.0)
Hemoglobin: 8.8 g/dL — ABNORMAL LOW (ref 12.0–15.0)
MCH: 30.4 pg (ref 26.0–34.0)
MCHC: 30.3 g/dL (ref 30.0–36.0)
MCV: 100.3 fL — ABNORMAL HIGH (ref 80.0–100.0)
Platelets: 299 10*3/uL (ref 150–400)
RBC: 2.89 MIL/uL — ABNORMAL LOW (ref 3.87–5.11)
RDW: 16.9 % — ABNORMAL HIGH (ref 11.5–15.5)
WBC: 6.6 10*3/uL (ref 4.0–10.5)
nRBC: 0 % (ref 0.0–0.2)

## 2021-10-19 LAB — PHOSPHORUS: Phosphorus: 3 mg/dL (ref 2.5–4.6)

## 2021-10-19 LAB — VITAMIN B12: Vitamin B-12: 630 pg/mL (ref 180–914)

## 2021-10-19 LAB — MAGNESIUM: Magnesium: 2 mg/dL (ref 1.7–2.4)

## 2021-10-19 LAB — FOLATE: Folate: 11.2 ng/mL (ref >5.9)

## 2021-10-19 LAB — FERRITIN: Ferritin: 219 ng/mL (ref 11–307)

## 2021-10-19 MED ORDER — HALOPERIDOL LACTATE 2 MG/ML PO CONC
2.0000 mg | ORAL | Status: AC
  Administered 2021-10-19: 2 mg via ORAL
  Filled 2021-10-19: qty 1

## 2021-10-19 MED ORDER — LATANOPROST 0.005 % OP SOLN
1.0000 [drp] | Freq: Every day | OPHTHALMIC | Status: DC
  Administered 2021-10-19 – 2021-10-21 (×2): 1 [drp] via OPHTHALMIC
  Filled 2021-10-19: qty 2.5

## 2021-10-19 MED ORDER — SODIUM CHLORIDE 0.9% IV SOLUTION: Freq: Once | INTRAVENOUS | Status: AC

## 2021-10-19 MED ORDER — LORAZEPAM 2 MG/ML IJ SOLN: 1.0000 mg | Freq: Four times a day (QID) | INTRAMUSCULAR | Status: DC | PRN

## 2021-10-19 MED ORDER — HYDRALAZINE HCL 25 MG PO TABS: 25.0000 mg | ORAL_TABLET | Freq: Three times a day (TID) | ORAL | Status: DC | PRN

## 2021-10-19 MED ORDER — ACETAMINOPHEN 10 MG/ML IV SOLN: 1000.0000 mg | INTRAVENOUS | Status: DC

## 2021-10-19 MED ORDER — FERROUS GLUCONATE 324 (38 FE) MG PO TABS
324.0000 mg | ORAL_TABLET | Freq: Every day | ORAL | Status: DC
  Administered 2021-10-20 – 2021-10-21 (×2): 324 mg via ORAL
  Filled 2021-10-19 (×2): qty 1

## 2021-10-19 MED ORDER — ACETAMINOPHEN 10 MG/ML IV SOLN
1000.0000 mg | Freq: Four times a day (QID) | INTRAVENOUS | Status: AC
  Administered 2021-10-19 – 2021-10-20 (×4): 1000 mg via INTRAVENOUS
  Filled 2021-10-19 (×4): qty 100

## 2021-10-19 MED ORDER — DORZOLAMIDE HCL 2 % OP SOLN
1.0000 [drp] | Freq: Two times a day (BID) | OPHTHALMIC | Status: DC
  Administered 2021-10-19 – 2021-10-22 (×6): 1 [drp] via OPHTHALMIC
  Filled 2021-10-19: qty 10

## 2021-10-19 MED ORDER — HYDROMORPHONE HCL 1 MG/ML IJ SOLN
1.0000 mg | INTRAMUSCULAR | Status: DC | PRN
  Administered 2021-10-19: 1 mg via INTRAVENOUS
  Filled 2021-10-19: qty 1

## 2021-10-19 MED ORDER — MORPHINE SULFATE (CONCENTRATE) 10 MG/0.5ML PO SOLN
20.0000 mg | ORAL | Status: DC | PRN
  Administered 2021-10-22: 20 mg via SUBLINGUAL
  Filled 2021-10-19: qty 1

## 2021-10-19 MED ORDER — LORAZEPAM 2 MG/ML IJ SOLN: 0.5000 mg | Freq: Four times a day (QID) | INTRAMUSCULAR | Status: DC | PRN

## 2021-10-19 NOTE — Consult Note (Signed)
Palliative Care Consultation Note  Narrative: 88 yo woman with advanced dementia s/p hip fracture with repair on 12/26, was discharged to [facility] for rehab. Her son Linda Copeland reports that she did fine for about three days but then started refusing to work with therapy, was agitated, not cooperative with staff and had poor PO intake-she has been mostly bedbound or in a wheelchair since her prior surgery and also did not have recovery of her mental status. Even though she had dementia, prior to this hip fracture she was able to walk some at home (lives with [place]), was able to watch TV and use a remote control and was mostly content and happy.She was admitted to hospital following a fall at [facility] that has resulted in complete dislocation of her hip and previously placed hardware. Palliative care consultation requested for goals of care.  Exam and Evaluation: Elderly woman sitting upright in bed pulling at her gown and bed sheets, I could hearing screaming out for help from the hallway and nurses station. Her right hip is swollen and deformed, she is agitated and paranoid, unable to carry on basic conversation or be distracted or consoled. She has blood products running through a fragile IV in her AC. She is on tele and continuous sats and is pulling at cords and lines.   Goals of Care: I spoke with her son by phone. He understands that his mother's condition is serious and that she is having severe pain, agitation and suffering related to her hip. He has concerns about what to expect following surgery and what her care needs will be and how he will manage them. He has financial struggles and problems with his transmission which make it difficult for him to travel to facilities to advocate for her care. He has multiple questions about additional surgery for which I have deferred to Dr. Delfino Copeland.  Clarified her CODE STATUS as DNR- ultimately her son desires relief of her suffering and preserving her  QOL.  Summary of Recommendations:  DNR, Ongoing discussions about medical and surgical interventions and treatment options. Hip Trauma/Fracture Dislocation: She is currently on the schedule for surgery tomorrow. I anticipate her outcomes will be similar surgery or no surgery regarding ultimate outcome. She will likely need pain and sedation either way due to agitated delirium and advanced dementia, she will probably not be able to meet her nutritional requirements and is looking at a baseline QOL bedbound or wc bound and dependent on all ADLs. If a non-surgical option ie comfort care is decided we can aggressively manage her pain and agitation in a hospice IPU. Consideration was given to the potential palliative role for surgical intervention ie. Pain relief, hematoma evacuation etc.and Will defer that conversation to Dr. Delfino Copeland. I have presented the option of comfort care and also hospice care. Her son is going to discuss with his brother and I am going to call him in AM to follow up.  3. Pain Control  Scheduled IV Tylenol  SL Roxanol PRN  SL Haldol PRN  Ativan IV PRN  Will follow up in AM.  Linda Hacker, DO Palliative Medicine  Time: 90 minutes

## 2021-10-19 NOTE — ED Notes (Signed)
Pt sitting up in bed, meds given, pt spit tylenol out, attempted to given tylenol again, pt spit it out again.  Pt took other medications.

## 2021-10-19 NOTE — Progress Notes (Signed)
PROGRESS NOTE    Linda Copeland  OFB:[id number] DOB: Aug 13, 1931 DOA: 10/18/2021 PCP: Gaynelle Arabian, MD    Brief Narrative:  Linda Copeland is a 88 year old female with past medical history significant for dementia, essential hypertension, osteopenia, history of vertebral compression fracture, glaucoma, hypothyroidism, recent right femoral neck fracture s/p repair on 09/26/2021 by Dr. Lyla Glassing who presents to [facility] ED from SNF with right hip pain and deformity.  Patient unable to provide history due to advanced dementia; and obtained from [place] and son at bedside.  Patient with reported fall at SNF from wheelchair to ground-level on 10/16/2021.  Went to her orthopedic surgeon for routine postsurgical appointment and was noted to have a right hip deformity.  Right hip x-ray was done notable for right acetabulum fracture and sent to the ED for further evaluation.  In the ED, temperature 98.2 F, BP 116/55, HR 78, RR 22, SPO2 95% on room air.  Sodium 135, potassium 3.5, chloride 103, CO2 26, glucose 107, BUN 15, creatinine 0.31.  Alkaline phosphatase 157, AST 21, ALT 15, total bilirubin 0.3.  WBC 11.1, hemoglobin 9.6, platelets 356.  COVID-19 PCR negative.  Influenza A/B PCR negative.  Urinalysis unrevealing.  Right knee x-ray with no acute osseous abnormality.  CT right hip with posterior acetabular wall fracture with dislocation of the femoral component and superior/posterior displacement of the femoral head, large anterior hip hematoma.  EDP discussed with orthopedics, plan operative management on 10/20/2021.  Hospital service consulted for further evaluation and management of hip fracture.   Assessment & Plan:   Principal Problem:   Acetabulum fracture, right (HCC) Active Problems:   Primary open angle glaucoma (POAG) of both eyes, severe stage   Hypertension   Hypothyroidism   Dementia without behavioral disturbance ([place])   Acetabulum fracture, right  Patient presenting to ED from orthopedics  office following fall at SNF with subsequent right acetabular fracture. --Orthopedics following, appreciate assistance --Tylenol 1000 mg QID --Oxycodone 5 mg PO q6h PRN moderate pain --Dilaudid 0.5 mg IV q4h PRN severe pain --Plan operative management 1/19, n.p.o. after midnight  Anemia Hematoma, right anterior hip Hemoglobin 9.6>8.8, transfusing 1 unit PRBCs per orthopedics today. --Check anemia panel --Continue ferrous gluconate 324 mg p.o. daily --Repeat hemoglobin in a.m.  Primary open angle glaucoma (POAG) of both eyes, severe stage --Continue home latanoprost and Trusopt eyedrops  Hypertension --Holding home lisinopril --Continue monitor BP closely  Hypothyroidism --Levothyroxine 100 mcg p.o. daily  Dementia without behavioral disturbance (HCC) --Delirium precautions --Get up during the day --Encourage a familiar face to remain present throughout the day --Keep blinds open and lights on during daylight hours --Minimize the use of opioids/benzodiazepines   DVT prophylaxis: Place and maintain sequential compression device Start: 10/18/21 1649   Code Status: Full Code Family Communication: No family present at bedside this morning  Disposition Plan:  Level of care: Telemetry Status is: Inpatient  Remains inpatient appropriate because: Pending operative management for right hip fracture planned for tomorrow, anticipate return back to [facility] after PT/OT evaluation postoperatively.   Consultants:  [facility]; Dr. Lyla Glassing  Procedures:  None  Antimicrobials:  None   Subjective: Patient seen examined bedside, resting comfortably.  Remains in ED holding area.  Eating breakfast being assisted by nurse tech.  Pleasantly confused.  No family present.  Denies pain.  No other questions or concerns at this time.  Unable to assess further ROS due to her underlying advanced dementia.  No acute concerns per nursing staff this morning.  Objective: Vitals:  10/19/21 0802 10/19/21 1023 10/19/21 1143 10/19/21 1303  BP: (!) 173/92 124/86 (!) 148/97 (!) 126/57  Pulse: 79 83 83 82  Resp: 20 20 14 18   Temp: 97.7 F (36.5 C) 98.8 F (37.1 C) 98.8 F (37.1 C) 98.2 F (36.8 C)  TempSrc: Oral Oral Oral Oral  SpO2: 98% 98% 100% 97%  Weight:   65 kg   Height:   5\' 1"  (1.549 m)    No intake or output data in the 24 hours ending 10/19/21 1350 Filed Weights   10/18/21 1155 10/19/21 1143  Weight: 65 kg 65 kg    Examination:  General exam: Appears calm and comfortable, pleasantly confused, elderly in appearance Respiratory system: Clear to auscultation. Respiratory effort normal.  On room air Cardiovascular system: S1 & S2 heard, RRR. No JVD, murmurs, rubs, gallops or clicks. No pedal edema. Gastrointestinal system: Abdomen is nondistended, soft and nontender. No organomegaly or masses felt. Normal bowel sounds heard. Central nervous system: Alert and oriented. No focal neurological deficits. Extremities: Right hip internally rotated and shortened, otherwise moves all extremities independently Skin: No rashes, lesions or ulcers Psychiatry: Judgement and insight appear poor. Mood & affect appropriate.     Data Reviewed: I have personally reviewed following labs and imaging studies  CBC: Recent Labs  Lab 10/18/21 1323 10/19/21 0500  WBC 11.1* 6.6  NEUTROABS 8.7*  --   HGB 9.6* 8.8*  HCT 30.3* 29.0*  MCV 98.1 100.3*  PLT 356 096   Basic Metabolic Panel: Recent Labs  Lab 10/18/21 1323 10/19/21 0500  NA 135 137  K 3.5 3.8  CL 103 107  CO2 26 25  GLUCOSE 107* 91  BUN 15 11  CREATININE 0.31* 0.36*  CALCIUM 8.3* 7.8*  MG  --  2.0  PHOS  --  3.0   GFR: Estimated Creatinine Clearance: 42 mL/min (A) (by C-G formula based on SCr of 0.36 mg/dL (L)). Liver Function Tests: Recent Labs  Lab 10/18/21 1323  AST 21  ALT 15  ALKPHOS 157*  BILITOT 0.3  PROT 7.2  ALBUMIN 3.2*   No results for input(s): LIPASE, AMYLASE in the last  168 hours. No results for input(s): AMMONIA in the last 168 hours. Coagulation Profile: No results for input(s): INR, PROTIME in the last 168 hours. Cardiac Enzymes: No results for input(s): CKTOTAL, CKMB, CKMBINDEX, TROPONINI in the last 168 hours. BNP (last 3 results) No results for input(s): PROBNP in the last 8760 hours. HbA1C: No results for input(s): HGBA1C in the last 72 hours. CBG: No results for input(s): GLUCAP in the last 168 hours. Lipid Profile: No results for input(s): CHOL, HDL, LDLCALC, TRIG, CHOLHDL, LDLDIRECT in the last 72 hours. Thyroid Function Tests: No results for input(s): TSH, T4TOTAL, FREET4, T3FREE, THYROIDAB in the last 72 hours. Anemia Panel: No results for input(s): VITAMINB12, FOLATE, FERRITIN, TIBC, IRON, RETICCTPCT in the last 72 hours. Sepsis Labs: No results for input(s): PROCALCITON, LATICACIDVEN in the last 168 hours.  Recent Results (from the past 240 hour(s))  Resp Panel by RT-PCR (Flu A&B, Covid) Nasopharyngeal Swab     Status: None   Collection Time: 10/18/21  1:23 PM   Specimen: Nasopharyngeal Swab; Nasopharyngeal(NP) swabs in vial transport medium  Result Value Ref Range Status   SARS Coronavirus 2 by RT PCR NEGATIVE NEGATIVE Final    Comment: (NOTE) SARS-CoV-2 target nucleic acids are NOT DETECTED.  The SARS-CoV-2 RNA is generally detectable in upper respiratory specimens during the acute phase of infection. The lowest  concentration of SARS-CoV-2 viral copies this assay can detect is 138 copies/mL. A negative result does not preclude SARS-Cov-2 infection and should not be used as the sole basis for treatment or other patient management decisions. A negative result may occur with  improper specimen collection/handling, submission of specimen other than nasopharyngeal swab, presence of viral mutation(s) within the areas targeted by this assay, and inadequate number of viral copies(<138 copies/mL). A negative result must be combined  with clinical observations, patient history, and epidemiological information. The expected result is Negative.  Fact Sheet for Patients:  EntrepreneurPulse.com.au  Fact Sheet for Healthcare Providers:  IncredibleEmployment.be  This test is no t yet approved or cleared by the [place] FDA and  has been authorized for detection and/or diagnosis of SARS-CoV-2 by FDA under an Emergency Use Authorization (EUA). This EUA will remain  in effect (meaning this test can be used) for the duration of the COVID-19 declaration under Section 564(b)(1) of the Act, 21 U.S.C.section 360bbb-3(b)(1), unless the authorization is terminated  or revoked sooner.       Influenza A by PCR NEGATIVE NEGATIVE Final   Influenza B by PCR NEGATIVE NEGATIVE Final    Comment: (NOTE) The Xpert Xpress SARS-CoV-2/FLU/RSV plus assay is intended as an aid in the diagnosis of influenza from Nasopharyngeal swab specimens and should not be used as a sole basis for treatment. Nasal washings and aspirates are unacceptable for Xpert Xpress SARS-CoV-2/FLU/RSV testing.  Fact Sheet for Patients: EntrepreneurPulse.com.au  Fact Sheet for Healthcare Providers: IncredibleEmployment.be  This test is not yet approved or cleared by the [place] FDA and has been authorized for detection and/or diagnosis of SARS-CoV-2 by FDA under an Emergency Use Authorization (EUA). This EUA will remain in effect (meaning this test can be used) for the duration of the COVID-19 declaration under Section 564(b)(1) of the Act, 21 U.S.C. section 360bbb-3(b)(1), unless the authorization is terminated or revoked.  Performed at [facility], [place] [address], [place], [place] [address]          Radiology Studies: CT HIP RIGHT WO CONTRAST  Result Date: 10/18/2021 CLINICAL DATA:  Hip surgical planning. Right hip shifting after replacement EXAM:  CT OF THE RIGHT HIP WITHOUT CONTRAST TECHNIQUE: Multidetector CT imaging of the right hip was performed according to the standard protocol. Multiplanar CT image reconstructions were also generated. RADIATION DOSE REDUCTION: This exam was performed according to the departmental dose-optimization program which includes automated exposure control, adjustment of the mA and/or kV according to patient size and/or use of iterative reconstruction technique. COMPARISON:  None. FINDINGS: Bones/Joint/Cartilage There is fracture of the posterior acetabular wall with approximately 1.6 cm posterior displacement of the largest fracture fragment. There is superior posterior displacement of the femoral component which is dislocated out of the acetabular component. There is also mildly displaced fractures of the anterior acetabular wall (series 3, image 53). There is likely intrapelvic migration of the acetabular component with adjacent acetabular roof fracture. Diffuse osteopenia. Ligaments Suboptimally assessed by CT. Muscles and Tendons Soft tissue swelling and likely intramuscular hematoma in the quadriceps femoris muscles measuring approximately 5.5 x 5.9 x 9.3 cm. There is also small hematoma about the posterior aspect of the hip joint. Soft tissues Small subcutaneous fluid collection/hematoma in the subcutaneous soft tissues of the anterior thigh measuring approximately 1.7 x 6.1 x 8.9 cm. IMPRESSION: 1. Status post right hip arthroplasty. Posterior acetabular wall fracture with dislocation of the femoral component out of the acetabulum and superior posterior displacement of  the femoral head. 2. There is also fracture of the anterior acetabular wall. 3. Fracture of the acetabular roof with mild migration of the acetabular component into the pelvis. 4. Large anterior hip hematoma about the quadriceps muscles measuring at least 5.5 x 5.9 cm. Small hematoma about the posterior aspect of the acetabular fracture. 5. Subcutaneous  soft tissue hematoma/seroma about the anterior aspect of the thigh. Electronically Signed   By: Keane Police D.O.   On: 10/18/2021 14:30   DG Knee Right Port  Result Date: 10/18/2021 CLINICAL DATA:  Periprosthetic fracture. EXAM: PORTABLE RIGHT KNEE - 1-2 VIEW COMPARISON:  Right knee radiographs 09/25/2021 FINDINGS: No acute fracture, dislocation, or knee joint effusion is identified. There is mild medial compartment joint space narrowing. Mild marginal spurring is noted in the medial compartment and along the patella. Atherosclerotic vascular calcifications are noted. IMPRESSION: No acute osseous abnormality identified. Electronically Signed   By: Logan Bores M.D.   On: 10/18/2021 13:46   DG Hip Unilat W or Wo Pelvis 2-3 Views Right  Result Date: 10/18/2021 CLINICAL DATA:  Total right hip arthroplasty 09/26/2021. Postop complication. EXAM: DG HIP (WITH OR WITHOUT PELVIS) 2-3V RIGHT COMPARISON:  09/26/2021 FINDINGS: The acetabular cup has migrated since the prior study. The cup has migrated inferiorly. There is now uncovering of the superior femoral head prosthesis. In addition, there is significant protrusio of the prosthesis which could be due to medial acetabular fracture. This was not present previously. Left hip joint normal.  No other pelvic lesion. IMPRESSION: Right hip replacement. Interval migration of the acetabular cup prosthesis inferiorly and medially. Probable fracture of the medial acetabulum. Recommend CT pelvis for further evaluation. Electronically Signed   By: Franchot Gallo M.D.   On: 10/18/2021 13:12        Scheduled Meds:  sodium chloride   Intravenous Once   acetaminophen  1,000 mg Oral QID   levothyroxine  100 mcg Oral QAC breakfast   senna  1 tablet Oral BID   vitamin B-12  1,000 mcg Oral Daily   Continuous Infusions:  lactated ringers       LOS: 1 day    Time spent: 46 minutes spent on chart review, discussion with nursing staff, consultants, updating family and  interview/physical exam; more than 50% of that time was spent in counseling and/or coordination of care.    Beulah Matusek J [place] DO [facility] Available via Epic secure chat 7am-7pm After these hours, please refer to coverage provider listed on [email or web address] 10/19/2021, 1:50 PM

## 2021-10-19 NOTE — ED Notes (Signed)
Pt in bed, pt oriented to first name, pt doesn't know day of the week or where she is, pt c/o mits, mits removed, no signs of injury noted, pt requests breakfast explained that breakfast would be here soon, pt has no grimace or c/o pain.

## 2021-10-19 NOTE — ED Notes (Signed)
ED TO INPATIENT HANDOFF REPORT  ED Nurse Name and Phone #: Salvatore Decent Name/Age/Gender Linda Copeland 88 y.o. female Room/Bed: WA17/WA17  Code Status   Code Status: Full Code  Home/SNF/Other Skilled nursing facility Patient oriented to: self Is this baseline? Yes   Triage Complete: Triage complete  Chief Complaint Acetabulum fracture, right ([place]) [place]  Triage Note Pt bib ems from [place], pt from [facility] and [facility].  Pt sent from emerg ortho d/t appearance of right hip shifting after replacement.    Allergies Allergies  Allergen Reactions   Codeine Nausea And Vomiting and Other (See Comments)    uknown   Biaxin [Clarithromycin] Other (See Comments)    Sweats   Caffeine     Shaky    Level of Care/Admitting Diagnosis ED Disposition     ED Disposition  Admit   Condition  --   Comment  Hospital Area: [place] [[id number]]  Level of Care: Telemetry [5]  Admit to tele based on following criteria: Monitor for Ischemic changes  May admit patient to [facility] or [facility] if equivalent level of care is available:: No  Covid Evaluation: Confirmed COVID Negative  Diagnosis: Acetabulum fracture, right [facility]) [[id number]]  Admitting Physician: Kayleen Memos [[id number]]  Attending Physician: Kayleen Memos [[id number]]  Estimated length of stay: past midnight tomorrow  Certification:: I certify this patient will need inpatient services for at least 2 midnights          B Medical/Surgery History Past Medical History:  Diagnosis Date   Arthritis    bilateral severe facet L4-L5, L5-S1   Carpal tunnel syndrome of right wrist    Chronic cervical radiculopathy    C5   Compression fracture of lumbar vertebra (HCC)    L2-L3   Environmental and seasonal allergies    Frequent urination    Hematoma of frontal scalp 10/06/2016   History of bronchitis    History of humerus fracture 10/06/2016   Right proximal   History of subdural  hematoma 10/06/2016   Left falcotentorial    HOH (hard of hearing)    Hypertension    Hypothyroidism    Nasal fracture 10/06/2016   OA (osteoarthritis)    AC glenohumeral joinr   Osteopenia    Spinal stenosis at L4-L5 level    Past Surgical History:  Procedure Laterality Date   CARPAL TUNNEL RELEASE Right 01/22/2018   Procedure: RIGHT CARPAL TUNNEL RELEASE;  Surgeon: Daryll Brod, MD;  Location: [facility];  Service: Orthopedics;  Laterality: Right;   COLONOSCOPY     TOTAL HIP ARTHROPLASTY Right 09/26/2021   Procedure: TOTAL HIP ARTHROPLASTY ANTERIOR APPROACH;  Surgeon: Rod Can, MD;  Location: [facility];  Service: Orthopedics;  Laterality: Right;     A IV Location/Drains/Wounds Patient Lines/Drains/Airways Status     Active Line/Drains/Airways     Name Placement date Placement time Site Days   Peripheral IV 10/18/21 20 G Right Antecubital 10/18/21  1320  Antecubital  1   External Urinary Catheter 10/18/21  1158  --  1   Incision (Closed) 09/26/21 Hip Right 09/26/21  1519  -- 23   Wound / Incision (Open or Dehisced) 09/28/21 (MASD) Moisture Associated Skin Damage Buttocks Posterior Red, Pink, Blanchable Circular Excoriated Area 09/28/21  2105  Buttocks  21            Intake/Output Last 24 hours No intake or output data in the 24 hours ending 10/19/21 1135  Labs/Imaging  Results for orders placed or performed during the hospital encounter of 10/18/21 (from the past 48 hour(s))  Type and screen [place]     Status: None (Preliminary result)   Collection Time: 10/18/21  1:23 PM  Result Value Ref Range   ABO/RH(D) O POS    Antibody Screen NEG    Sample Expiration      10/21/2021,2359 Performed at [facility], [place] [address], [place], [place] [address]    Unit Number [id number]    Blood Component Type RED CELLS,LR    Unit division 00    Status of Unit ALLOCATED    Transfusion Status OK TO TRANSFUSE     Crossmatch Result Compatible    Unit Number [id number]    Blood Component Type RED CELLS,LR    Unit division 00    Status of Unit ALLOCATED    Transfusion Status OK TO TRANSFUSE    Crossmatch Result Compatible    Unit Number [id number]    Blood Component Type RED CELLS,LR    Unit division 00    Status of Unit ALLOCATED    Transfusion Status OK TO TRANSFUSE    Crossmatch Result Compatible   Comprehensive metabolic panel     Status: Abnormal   Collection Time: 10/18/21  1:23 PM  Result Value Ref Range   Sodium 135 135 - 145 mmol/L   Potassium 3.5 3.5 - 5.1 mmol/L   Chloride 103 98 - 111 mmol/L   CO2 26 22 - 32 mmol/L   Glucose, Bld 107 (H) 70 - 99 mg/dL    Comment: Glucose reference range applies only to samples taken after fasting for at least 8 hours.   BUN 15 8 - 23 mg/dL   Creatinine, Ser 0.31 (L) 0.44 - 1.00 mg/dL   Calcium 8.3 (L) 8.9 - 10.3 mg/dL   Total Protein 7.2 6.5 - 8.1 g/dL   Albumin 3.2 (L) 3.5 - 5.0 g/dL   AST 21 15 - 41 U/L   ALT 15 0 - 44 U/L   Alkaline Phosphatase 157 (H) 38 - 126 U/L   Total Bilirubin 0.3 0.3 - 1.2 mg/dL   GFR, Estimated >60 >60 mL/min    Comment: (NOTE) Calculated using the CKD-EPI Creatinine Equation (2021)    Anion gap 6 5 - 15    Comment: Performed at [facility], [place] [address] [place], [place] [address]  CBC with Differential     Status: Abnormal   Collection Time: 10/18/21  1:23 PM  Result Value Ref Range   WBC 11.1 (H) 4.0 - 10.5 K/uL   RBC 3.09 (L) 3.87 - 5.11 MIL/uL   Hemoglobin 9.6 (L) 12.0 - 15.0 g/dL   HCT 30.3 (L) 36.0 - 46.0 %   MCV 98.1 80.0 - 100.0 fL   MCH 31.1 26.0 - 34.0 pg   MCHC 31.7 30.0 - 36.0 g/dL   RDW 17.1 (H) 11.5 - 15.5 %   Platelets 356 150 - 400 K/uL   nRBC 0.0 0.0 - 0.2 %   Neutrophils Relative % 79 %   Neutro Abs 8.7 (H) 1.7 - 7.7 K/uL   Lymphocytes Relative 12 %   Lymphs Abs 1.3 0.7 - 4.0 K/uL   Monocytes Relative 7 %   Monocytes Absolute 0.8 0.1 - 1.0 K/uL    Eosinophils Relative 1 %   Eosinophils Absolute 0.1 0.0 - 0.5 K/uL   Basophils Relative 0 %   Basophils Absolute 0.0 0.0 - 0.1  K/uL   Immature Granulocytes 1 %   Abs Immature Granulocytes 0.06 0.00 - 0.07 K/uL    Comment: Performed at [facility], [place] [address] [place], [place] [address]  Resp Panel by RT-PCR (Flu A&B, Covid) Nasopharyngeal Swab     Status: None   Collection Time: 10/18/21  1:23 PM   Specimen: Nasopharyngeal Swab; Nasopharyngeal(NP) swabs in vial transport medium  Result Value Ref Range   SARS Coronavirus 2 by RT PCR NEGATIVE NEGATIVE    Comment: (NOTE) SARS-CoV-2 target nucleic acids are NOT DETECTED.  The SARS-CoV-2 RNA is generally detectable in upper respiratory specimens during the acute phase of infection. The lowest concentration of SARS-CoV-2 viral copies this assay can detect is 138 copies/mL. A negative result does not preclude SARS-Cov-2 infection and should not be used as the sole basis for treatment or other patient management decisions. A negative result may occur with  improper specimen collection/handling, submission of specimen other than nasopharyngeal swab, presence of viral mutation(s) within the areas targeted by this assay, and inadequate number of viral copies(<138 copies/mL). A negative result must be combined with clinical observations, patient history, and epidemiological information. The expected result is Negative.  Fact Sheet for Patients:  EntrepreneurPulse.com.au  Fact Sheet for Healthcare Providers:  IncredibleEmployment.be  This test is no t yet approved or cleared by the [place] FDA and  has been authorized for detection and/or diagnosis of SARS-CoV-2 by FDA under an Emergency Use Authorization (EUA). This EUA will remain  in effect (meaning this test can be used) for the duration of the COVID-19 declaration under Section 564(b)(1) of the Act, 21 U.S.C.section  360bbb-3(b)(1), unless the authorization is terminated  or revoked sooner.       Influenza A by PCR NEGATIVE NEGATIVE   Influenza B by PCR NEGATIVE NEGATIVE    Comment: (NOTE) The Xpert Xpress SARS-CoV-2/FLU/RSV plus assay is intended as an aid in the diagnosis of influenza from Nasopharyngeal swab specimens and should not be used as a sole basis for treatment. Nasal washings and aspirates are unacceptable for Xpert Xpress SARS-CoV-2/FLU/RSV testing.  Fact Sheet for Patients: EntrepreneurPulse.com.au  Fact Sheet for Healthcare Providers: IncredibleEmployment.be  This test is not yet approved or cleared by the [place] FDA and has been authorized for detection and/or diagnosis of SARS-CoV-2 by FDA under an Emergency Use Authorization (EUA). This EUA will remain in effect (meaning this test can be used) for the duration of the COVID-19 declaration under Section 564(b)(1) of the Act, 21 U.S.C. section 360bbb-3(b)(1), unless the authorization is terminated or revoked.  Performed at [facility], [place] [address], [place], [place] [address]   Urinalysis, Routine w reflex microscopic Urine, Clean Catch     Status: Abnormal   Collection Time: 10/18/21  8:56 PM  Result Value Ref Range   Color, Urine AMBER (A) YELLOW    Comment: BIOCHEMICALS MAY BE AFFECTED BY COLOR   APPearance CLOUDY (A) CLEAR   Specific Gravity, Urine 1.026 1.005 - 1.030   pH 5.0 5.0 - 8.0   Glucose, UA NEGATIVE NEGATIVE mg/dL   Hgb urine dipstick SMALL (A) NEGATIVE   Bilirubin Urine NEGATIVE NEGATIVE   Ketones, ur 5 (A) NEGATIVE mg/dL   Protein, ur 30 (A) NEGATIVE mg/dL   Nitrite NEGATIVE NEGATIVE   Leukocytes,Ua NEGATIVE NEGATIVE   RBC / HPF 0-5 0 - 5 RBC/hpf   WBC, UA 0-5 0 - 5 WBC/hpf   Bacteria, UA RARE (A) NONE SEEN   Squamous  Epithelial / LPF 0-5 0 - 5   Hyaline Casts, UA PRESENT    Amorphous Crystal PRESENT     Comment: Performed at [facility], [place] [address], [place], [place] [address]  CBC     Status: Abnormal   Collection Time: 10/19/21  5:00 AM  Result Value Ref Range   WBC 6.6 4.0 - 10.5 K/uL   RBC 2.89 (L) 3.87 - 5.11 MIL/uL   Hemoglobin 8.8 (L) 12.0 - 15.0 g/dL   HCT 29.0 (L) 36.0 - 46.0 %   MCV 100.3 (H) 80.0 - 100.0 fL   MCH 30.4 26.0 - 34.0 pg   MCHC 30.3 30.0 - 36.0 g/dL   RDW 16.9 (H) 11.5 - 15.5 %   Platelets 299 150 - 400 K/uL   nRBC 0.0 0.0 - 0.2 %    Comment: Performed at [facility], [place] [address] [place], [place] [address]  Basic metabolic panel     Status: Abnormal   Collection Time: 10/19/21  5:00 AM  Result Value Ref Range   Sodium 137 135 - 145 mmol/L   Potassium 3.8 3.5 - 5.1 mmol/L   Chloride 107 98 - 111 mmol/L   CO2 25 22 - 32 mmol/L   Glucose, Bld 91 70 - 99 mg/dL    Comment: Glucose reference range applies only to samples taken after fasting for at least 8 hours.   BUN 11 8 - 23 mg/dL   Creatinine, Ser 0.36 (L) 0.44 - 1.00 mg/dL   Calcium 7.8 (L) 8.9 - 10.3 mg/dL   GFR, Estimated >60 >60 mL/min    Comment: (NOTE) Calculated using the CKD-EPI Creatinine Equation (2021)    Anion gap 5 5 - 15    Comment: Performed at [facility], [place] [address], [place], [place] [address]  Magnesium     Status: None   Collection Time: 10/19/21  5:00 AM  Result Value Ref Range   Magnesium 2.0 1.7 - 2.4 mg/dL    Comment: Performed at [facility], [place] [address] [place], [place] [address]  Phosphorus     Status: None   Collection Time: 10/19/21  5:00 AM  Result Value Ref Range   Phosphorus 3.0 2.5 - 4.6 mg/dL    Comment: Performed at [facility], [place] [address], [place], [place] [address]  Prepare RBC (crossmatch)     Status: None   Collection Time: 10/19/21 10:39 AM  Result Value Ref Range   Order Confirmation      ORDER PROCESSED BY BLOOD BANK Performed at [facility], [place]  [address], [place],  [address]    CT HIP RIGHT WO CONTRAST  Result Date: 10/18/2021 CLINICAL DATA:  Hip surgical planning. Right hip shifting after replacement EXAM: CT OF THE RIGHT HIP WITHOUT CONTRAST TECHNIQUE: Multidetector CT imaging of the right hip was performed according to the standard protocol. Multiplanar CT image reconstructions were also generated. RADIATION DOSE REDUCTION: This exam was performed according to the departmental dose-optimization program which includes automated exposure control, adjustment of the mA and/or kV according to patient size and/or use of iterative reconstruction technique. COMPARISON:  None. FINDINGS: Bones/Joint/Cartilage There is fracture of the posterior acetabular wall with approximately 1.6 cm posterior displacement of the largest fracture fragment. There is superior posterior displacement of the femoral component which is dislocated out of the acetabular component. There is also mildly displaced fractures of the anterior acetabular wall (series 3, image  11). There is likely intrapelvic migration of the acetabular component with adjacent acetabular roof fracture. Diffuse osteopenia. Ligaments Suboptimally assessed by CT. Muscles and Tendons Soft tissue swelling and likely intramuscular hematoma in the quadriceps femoris muscles measuring approximately 5.5 x 5.9 x 9.3 cm. There is also small hematoma about the posterior aspect of the hip joint. Soft tissues Small subcutaneous fluid collection/hematoma in the subcutaneous soft tissues of the anterior thigh measuring approximately 1.7 x 6.1 x 8.9 cm. IMPRESSION: 1. Status post right hip arthroplasty. Posterior acetabular wall fracture with dislocation of the femoral component out of the acetabulum and superior posterior displacement of the femoral head. 2. There is also fracture of the anterior acetabular wall. 3. Fracture of the acetabular roof with mild migration of the acetabular component into the pelvis. 4.  Large anterior hip hematoma about the quadriceps muscles measuring at least 5.5 x 5.9 cm. Small hematoma about the posterior aspect of the acetabular fracture. 5. Subcutaneous soft tissue hematoma/seroma about the anterior aspect of the thigh. Electronically Signed   By: Keane Police D.O.   On: 10/18/2021 14:30   DG Knee Right Port  Result Date: 10/18/2021 CLINICAL DATA:  Periprosthetic fracture. EXAM: PORTABLE RIGHT KNEE - 1-2 VIEW COMPARISON:  Right knee radiographs 09/25/2021 FINDINGS: No acute fracture, dislocation, or knee joint effusion is identified. There is mild medial compartment joint space narrowing. Mild marginal spurring is noted in the medial compartment and along the patella. Atherosclerotic vascular calcifications are noted. IMPRESSION: No acute osseous abnormality identified. Electronically Signed   By: Logan Bores M.D.   On: 10/18/2021 13:46   DG Hip Unilat W or Wo Pelvis 2-3 Views Right  Result Date: 10/18/2021 CLINICAL DATA:  Total right hip arthroplasty 09/26/2021. Postop complication. EXAM: DG HIP (WITH OR WITHOUT PELVIS) 2-3V RIGHT COMPARISON:  09/26/2021 FINDINGS: The acetabular cup has migrated since the prior study. The cup has migrated inferiorly. There is now uncovering of the superior femoral head prosthesis. In addition, there is significant protrusio of the prosthesis which could be due to medial acetabular fracture. This was not present previously. Left hip joint normal.  No other pelvic lesion. IMPRESSION: Right hip replacement. Interval migration of the acetabular cup prosthesis inferiorly and medially. Probable fracture of the medial acetabulum. Recommend CT pelvis for further evaluation. Electronically Signed   By: Franchot Gallo M.D.   On: 10/18/2021 13:12    Pending Labs Unresulted Labs (From admission, onward)     Start     Ordered   10/19/21 0500  CBC  Daily,   R      10/18/21 1448   10/19/21 0867  Basic metabolic panel  Daily,   R      10/18/21 1448             Vitals/Pain Today's Vitals   10/19/21 0430 10/19/21 0500 10/19/21 0802 10/19/21 1023  BP: (!) 151/85 (!) 154/86 (!) 173/92 124/86  Pulse: 72 75 79 83  Resp: 19 (!) 22 20 20   Temp:   97.7 F (36.5 C) 98.8 F (37.1 C)  TempSrc:   Oral Oral  SpO2: 97% 98% 98% 98%  Weight:      Height:      PainSc:        Isolation Precautions No active isolations  Medications Medications  levothyroxine (SYNTHROID) tablet 100 mcg (100 mcg Oral Given 10/19/21 0529)  senna (SENOKOT) tablet 8.6 mg (8.6 mg Oral Given 10/19/21 1124)  vitamin B-12 (CYANOCOBALAMIN) tablet 1,000 mcg (1,000 mcg Oral  Given 10/19/21 1123)  HYDROmorphone (DILAUDID) injection 0.5 mg (has no administration in time range)  oxyCODONE (Oxy IR/ROXICODONE) immediate release tablet 5 mg (has no administration in time range)  lactated ringers infusion (has no administration in time range)  acetaminophen (TYLENOL) tablet 1,000 mg (1,000 mg Oral Not Given 10/19/21 1117)  0.9 %  sodium chloride infusion (Manually program via Guardrails IV Fluids) (has no administration in time range)  LORazepam (ATIVAN) injection 0.5 mg (0.5 mg Intravenous Given 10/18/21 1424)    Mobility non-ambulatory High fall risk      R Recommendations: See Admitting Provider Note  Report given to:   Additional Notes: Pt knows her last name, pt doesn't know place, time, birthday, pt confused.

## 2021-10-19 NOTE — Progress Notes (Signed)
Plan for surgery, revision R THA, tomorrow. Hgb 8.8 due to acute blood loss anemia. Will give 1 unit PRBCs today. NPO after MN. Hold chemical DVT ppx due to risk of bleeding / surgery tomorrow.

## 2021-10-19 NOTE — ED Notes (Signed)
Pt in bed, pt confused, pt not moaning or making noises of pain, pt requests me to put her blankets in the back of the car, rearranged pt's bedding.

## 2021-10-20 ENCOUNTER — Inpatient Hospital Stay (HOSPITAL_COMMUNITY): Admission: RE | Admit: 2021-10-20 | Payer: Medicare HMO | Source: Ambulatory Visit | Admitting: Orthopedic Surgery

## 2021-10-20 ENCOUNTER — Encounter (HOSPITAL_COMMUNITY)
Admission: EM | Disposition: A | Discharge status: Hospice | Payer: Self-pay | Source: Skilled Nursing Facility | Attending: Internal Medicine

## 2021-10-20 LAB — CBC
HCT: 31.2 % — ABNORMAL LOW (ref 36.0–46.0)
Hemoglobin: 9.9 g/dL — ABNORMAL LOW (ref 12.0–15.0)
MCH: 30.1 pg (ref 26.0–34.0)
MCHC: 31.7 g/dL (ref 30.0–36.0)
MCV: 94.8 fL (ref 80.0–100.0)
Platelets: 244 10*3/uL (ref 150–400)
RBC: 3.29 MIL/uL — ABNORMAL LOW (ref 3.87–5.11)
RDW: 17.6 % — ABNORMAL HIGH (ref 11.5–15.5)
WBC: 6.3 10*3/uL (ref 4.0–10.5)
nRBC: 0 % (ref 0.0–0.2)

## 2021-10-20 LAB — BASIC METABOLIC PANEL
Anion gap: 8 (ref 5–15)
BUN: 10 mg/dL (ref 8–23)
CO2: 23 mmol/L (ref 22–32)
Calcium: 8.2 mg/dL — ABNORMAL LOW (ref 8.9–10.3)
Chloride: 104 mmol/L (ref 98–111)
Creatinine, Ser: <0.3 mg/dL — ABNORMAL LOW (ref 0.44–1.00)
Glucose, Bld: 88 mg/dL (ref 70–99)
Potassium: 3.8 mmol/L (ref 3.5–5.1)
Sodium: 135 mmol/L (ref 135–145)

## 2021-10-20 SURGERY — ARTHROPLASTY, HIP, TOTAL, ANTERIOR APPROACH
Anesthesia: Choice | Site: Hip | Laterality: Right

## 2021-10-20 MED ORDER — BUPIVACAINE-EPINEPHRINE (PF) 0.25% -1:200000 IJ SOLN
INTRAMUSCULAR | Status: AC
  Filled 2021-10-20: qty 30

## 2021-10-20 MED ORDER — KETOROLAC TROMETHAMINE 30 MG/ML IJ SOLN
INTRAMUSCULAR | Status: AC
  Filled 2021-10-20: qty 1

## 2021-10-20 MED ORDER — HALOPERIDOL LACTATE 2 MG/ML PO CONC
2.0000 mg | Freq: Three times a day (TID) | ORAL | Status: DC | PRN
  Filled 2021-10-20 (×2): qty 1

## 2021-10-20 NOTE — TOC Initial Note (Signed)
Transition of Care [facility]) - Initial/Assessment Note   Patient Details  Name: Jakira Mcfadden MRN: [id number] Date of Birth: 07/21/1931  Transition of Care [facility]) CM/SW Contact:    Sherie Don, LCSW Phone Number: 10/20/2021, 12:34 PM  Clinical Narrative: [facility] consulted for residential referral to [facility]. CSW spoke with patient's son, [name] [place]an[place], regarding the referral. Son agreeable to hospice referral. CSW made referral to [facility] with [facility]. [facility] to assess patient for [facility]. TOC to follow.  Expected Discharge Plan: [place] Barriers to Discharge: Hospice Bed not available  Patient Goals and CMS Choice Patient states their goals for this hospitalization and ongoing recovery are:: Go to [facility] Medicare.gov Compare Post Acute Care list provided to:: Patient Represenative (must comment) Choice offered to / list presented to : Adult Children  Expected Discharge Plan and Services Expected Discharge Plan: [place] In-house Referral: Clinical Social Work Post Acute Care Choice: Residential Hospice Bed Living arrangements for the past 2 months: [place]          DME Arranged: N/A DME Agency: NA  Prior Living Arrangements/Services Living arrangements for the past 2 months: Single Family Home Lives with:: Adult Children Patient language and need for interpreter reviewed:: Yes Need for Family Participation in Patient Care: Yes (Comment) Care giver support system in place?: Yes (comment) Criminal Activity/Legal Involvement Pertinent to Current Situation/Hospitalization: No - Comment as needed  Permission Sought/Granted Permission sought to share information with : Chartered certified accountant granted to share information with : Yes, Verbal Permission Granted Permission granted to share info w AGENCY: Optometrist  Emotional Assessment Orientation: :  (Patient is disoriented x4.) Alcohol / Substance  Use: Not Applicable Psych Involvement: No (comment)  Admission diagnosis:  Acetabulum fracture, right ([place]) [S32.401A] Peri-prosthetic fracture of femur following total hip arthroplasty [Q76.8XXA, Z96.649] Closed nondisplaced fracture of right acetabulum, unspecified portion of acetabulum, initial encounter ([place]) [S32.401A] Patient Active Problem List   Diagnosis Date Noted   Dementia without behavioral disturbance ([place]) 10/19/2021   Acetabulum fracture, right ([place]) 10/18/2021   Pressure injury of skin 09/29/2021   Hip fracture ([place]) 09/25/2021   Hypertension    Hypothyroidism    Primary open angle glaucoma (POAG) of both eyes, severe stage 03/21/2019   Closed fracture of nasal bone with routine healing 10/11/2016   Subdural hematoma 10/05/2016   PCP:  Gaynelle Arabian, MD Pharmacy:   [facility] [place], [place] - [place] [address] AT [place] [address] Phone: [phone] Fax: [phone]  Readmission Risk Interventions No flowsheet data found.

## 2021-10-20 NOTE — Progress Notes (Signed)
Nutrition Brief Note  RD consulted via hip fracture protocol.  Chart reviewed. Pt now transitioning to comfort care. Surgery cancelled. No nutrition interventions planned at this time.   Clayton Bibles, MS, RD, LDN Inpatient Clinical Dietitian Contact information available via Amion

## 2021-10-20 NOTE — Progress Notes (Signed)
Manufacturing engineer [facility] Hospital Liaison Note  Received request from [facility] [place] for family interest in [facility]. Visited patient at bedside and spoke with son/Scott to confirm interest and explain services.  Approval for [employer] is determined by [facility] MD. Once [facility] MD has determined [facility] eligibility, [place] will update hospital staff and family.  Please do not hesitate to call with any hospice related questions.    Thank you for the opportunity to participate in this patient's care.  Daphene Calamity, MSW [facility] Liaison  [phone]

## 2021-10-20 NOTE — Progress Notes (Signed)
PROGRESS NOTE    Linda Copeland  QBV:[id number] DOB: 28-Jan-1931 DOA: 10/18/2021 PCP: Gaynelle Arabian, MD    Brief Narrative:  Linda Copeland is a 88 year old female with past medical history significant for dementia, essential hypertension, osteopenia, history of vertebral compression fracture, glaucoma, hypothyroidism, recent right femoral neck fracture s/p repair on 09/26/2021 by Dr. Lyla Glassing who presents to [facility] ED from SNF with right hip pain and deformity.  Patient unable to provide history due to advanced dementia; and obtained from [place] and son at bedside.  Patient with reported fall at SNF from wheelchair to ground-level on 10/16/2021.  Went to her orthopedic surgeon for routine postsurgical appointment and was noted to have a right hip deformity.  Right hip x-ray was done notable for right acetabulum fracture and sent to the ED for further evaluation.  In the ED, temperature 98.2 F, BP 116/55, HR 78, RR 22, SPO2 95% on room air.  Sodium 135, potassium 3.5, chloride 103, CO2 26, glucose 107, BUN 15, creatinine 0.31.  Alkaline phosphatase 157, AST 21, ALT 15, total bilirubin 0.3.  WBC 11.1, hemoglobin 9.6, platelets 356.  COVID-19 PCR negative.  Influenza A/B PCR negative.  Urinalysis unrevealing.  Right knee x-ray with no acute osseous abnormality.  CT right hip with posterior acetabular wall fracture with dislocation of the femoral component and superior/posterior displacement of the femoral head, large anterior hip hematoma.  EDP discussed with orthopedics, plan operative management on 10/20/2021.  Hospital service consulted for further evaluation and management of hip fracture.   Assessment & Plan:   Principal Problem:   Acetabulum fracture, right (HCC) Active Problems:   Primary open angle glaucoma (POAG) of both eyes, severe stage   Hypertension   Hypothyroidism   Dementia without behavioral disturbance ([place])   Acetabulum fracture, right  Patient presenting to ED from orthopedics  office following fall at SNF with subsequent right acetabular fracture.  Patient was evaluated by orthopedics, Dr. Lyla Glassing and seen by palliative care.  Discussion with patient's family given her significant decline, frailty, failure to thrive and likely poor outcome with surgical invention; it was determined the best course of action was to focus on a more comfort/palliative approach rather than aggressive surgical intervention.  Patient now transitioned to comfort measures. --Tylenol 1000 mg QID --Morphine 20 mg SL q1h PRN moderate pain --Dilaudid 1 mg IV q2h PRN severe pain --TOC for residential hospice placement  Anemia Hematoma, right anterior hip Hemoglobin 9.6>8.8>9.9 --s/p 1 unit PRBC 1/18 --Check anemia panel --Continue ferrous gluconate 324 mg p.o. daily  Primary open angle glaucoma (POAG) of both eyes, severe stage --Continue home latanoprost and Trusopt eyedrops  Hypertension Holding home lisinopril, now on comfort measures  Hypothyroidism Levothyroxine 100 mcg p.o. daily  Dementia without behavioral disturbance --Delirium precautions --Get up during the day --Encourage a familiar face to remain present throughout the day --Keep blinds open and lights on during daylight hours --Haldol 2 mg p.o. every 8 hours as needed agitation --Ativan 1 mg IV every 6 hours as needed anxiety/agitation   DVT prophylaxis: Place and maintain sequential compression device Start: 10/18/21 1649   Code Status: DNR Family Communication: No family present at bedside this morning, updated patient's son [name] via telephone this morning.  Disposition Plan:  Level of care: Telemetry Status is: Inpatient  Remains inpatient appropriate because: Pending residential hospice placement.   Consultants:  [facility]; Dr. Lyla Glassing  Procedures:  None  Antimicrobials:  None   Subjective: Patient seen examined bedside, resting comfortably.  Pleasantly confused.  Sleeping but easily  arousable.  "I need coffee".  Discussed with patient's son this morning, had a really spoken to orthopedics, Dr. Lyla Glassing and given her to thrive determine best course of action is to proceed with comfort measures and avoid any further surgical intervention.  Updated POC and plan for residential hospice placement. Unable to assess further ROS due to her underlying advanced dementia.  No acute concerns per nursing staff this morning.  Objective: Vitals:   10/19/21 2116 10/20/21 0103 10/20/21 0407 10/20/21 0500  BP: 138/66 108/60 119/70   Pulse: 76 66 64   Resp: 18 14 18    Temp: 97.8 F (36.6 C) 98.3 F (36.8 C) (!) 97.5 F (36.4 C) 98.1 F (36.7 C)  TempSrc: Axillary Axillary Oral Oral  SpO2: 98% 96% 96%   Weight:      Height:        Intake/Output Summary (Last 24 hours) at 10/20/2021 0951 Last data filed at 10/20/2021 0900 Gross per 24 hour  Intake 580 ml  Output --  Net 580 ml   Filed Weights   10/18/21 1155 10/19/21 1143  Weight: 65 kg 65 kg    Examination:  General exam: Appears calm and comfortable, pleasantly confused, elderly in appearance Respiratory system: Clear to auscultation. Respiratory effort normal.  On room air Cardiovascular system: S1 & S2 heard, RRR. No JVD, murmurs, rubs, gallops or clicks. No pedal edema. Gastrointestinal system: Abdomen is nondistended, soft and nontender. No organomegaly or masses felt. Normal bowel sounds heard. Central nervous system: Alert and oriented. No focal neurological deficits. Extremities: Right hip internally rotated and shortened, otherwise moves all extremities independently Skin: No rashes, lesions or ulcers Psychiatry: Judgement and insight appear poor. Mood & affect appropriate.     Data Reviewed: I have personally reviewed following labs and imaging studies  CBC: Recent Labs  Lab 10/18/21 1323 10/19/21 0500 10/20/21 0436  WBC 11.1* 6.6 6.3  NEUTROABS 8.7*  --   --   HGB 9.6* 8.8* 9.9*  HCT 30.3* 29.0* 31.2*   MCV 98.1 100.3* 94.8  PLT 356 299 465   Basic Metabolic Panel: Recent Labs  Lab 10/18/21 1323 10/19/21 0500 10/20/21 0436  NA 135 137 135  K 3.5 3.8 3.8  CL 103 107 104  CO2 26 25 23   GLUCOSE 107* 91 88  BUN 15 11 10   CREATININE 0.31* 0.36* <0.30*  CALCIUM 8.3* 7.8* 8.2*  MG  --  2.0  --   PHOS  --  3.0  --    GFR: CrCl cannot be calculated (This lab value cannot be used to calculate CrCl because it is not a number: <0.30). Liver Function Tests: Recent Labs  Lab 10/18/21 1323  AST 21  ALT 15  ALKPHOS 157*  BILITOT 0.3  PROT 7.2  ALBUMIN 3.2*   No results for input(s): LIPASE, AMYLASE in the last 168 hours. No results for input(s): AMMONIA in the last 168 hours. Coagulation Profile: No results for input(s): INR, PROTIME in the last 168 hours. Cardiac Enzymes: No results for input(s): CKTOTAL, CKMB, CKMBINDEX, TROPONINI in the last 168 hours. BNP (last 3 results) No results for input(s): PROBNP in the last 8760 hours. HbA1C: No results for input(s): HGBA1C in the last 72 hours. CBG: No results for input(s): GLUCAP in the last 168 hours. Lipid Profile: No results for input(s): CHOL, HDL, LDLCALC, TRIG, CHOLHDL, LDLDIRECT in the last 72 hours. Thyroid Function Tests: No results for input(s): TSH, T4TOTAL, FREET4, T3FREE, THYROIDAB in the  last 72 hours. Anemia Panel: Recent Labs    10/19/21 0500 10/19/21 1444  VITAMINB12  --  630  FOLATE  --  11.2  FERRITIN  --  219  TIBC  --  237*  IRON  --  58  RETICCTPCT 3.8*  --    Sepsis Labs: No results for input(s): PROCALCITON, LATICACIDVEN in the last 168 hours.  Recent Results (from the past 240 hour(s))  Resp Panel by RT-PCR (Flu A&B, Covid) Nasopharyngeal Swab     Status: None   Collection Time: 10/18/21  1:23 PM   Specimen: Nasopharyngeal Swab; Nasopharyngeal(NP) swabs in vial transport medium  Result Value Ref Range Status   SARS Coronavirus 2 by RT PCR NEGATIVE NEGATIVE Final    Comment:  (NOTE) SARS-CoV-2 target nucleic acids are NOT DETECTED.  The SARS-CoV-2 RNA is generally detectable in upper respiratory specimens during the acute phase of infection. The lowest concentration of SARS-CoV-2 viral copies this assay can detect is 138 copies/mL. A negative result does not preclude SARS-Cov-2 infection and should not be used as the sole basis for treatment or other patient management decisions. A negative result may occur with  improper specimen collection/handling, submission of specimen other than nasopharyngeal swab, presence of viral mutation(s) within the areas targeted by this assay, and inadequate number of viral copies(<138 copies/mL). A negative result must be combined with clinical observations, patient history, and epidemiological information. The expected result is Negative.  Fact Sheet for Patients:  EntrepreneurPulse.com.au  Fact Sheet for Healthcare Providers:  IncredibleEmployment.be  This test is no t yet approved or cleared by the [place] FDA and  has been authorized for detection and/or diagnosis of SARS-CoV-2 by FDA under an Emergency Use Authorization (EUA). This EUA will remain  in effect (meaning this test can be used) for the duration of the COVID-19 declaration under Section 564(b)(1) of the Act, 21 U.S.C.section 360bbb-3(b)(1), unless the authorization is terminated  or revoked sooner.       Influenza A by PCR NEGATIVE NEGATIVE Final   Influenza B by PCR NEGATIVE NEGATIVE Final    Comment: (NOTE) The Xpert Xpress SARS-CoV-2/FLU/RSV plus assay is intended as an aid in the diagnosis of influenza from Nasopharyngeal swab specimens and should not be used as a sole basis for treatment. Nasal washings and aspirates are unacceptable for Xpert Xpress SARS-CoV-2/FLU/RSV testing.  Fact Sheet for Patients: EntrepreneurPulse.com.au  Fact Sheet for Healthcare  Providers: IncredibleEmployment.be  This test is not yet approved or cleared by the [place] FDA and has been authorized for detection and/or diagnosis of SARS-CoV-2 by FDA under an Emergency Use Authorization (EUA). This EUA will remain in effect (meaning this test can be used) for the duration of the COVID-19 declaration under Section 564(b)(1) of the Act, 21 U.S.C. section 360bbb-3(b)(1), unless the authorization is terminated or revoked.  Performed at [facility], [place] [address] [place], [place] [address]          Radiology Studies: CT HIP RIGHT WO CONTRAST  Result Date: 10/18/2021 CLINICAL DATA:  Hip surgical planning. Right hip shifting after replacement EXAM: CT OF THE RIGHT HIP WITHOUT CONTRAST TECHNIQUE: Multidetector CT imaging of the right hip was performed according to the standard protocol. Multiplanar CT image reconstructions were also generated. RADIATION DOSE REDUCTION: This exam was performed according to the departmental dose-optimization program which includes automated exposure control, adjustment of the mA and/or kV according to patient size and/or use of iterative reconstruction technique. COMPARISON:  None. FINDINGS: Bones/Joint/Cartilage There  is fracture of the posterior acetabular wall with approximately 1.6 cm posterior displacement of the largest fracture fragment. There is superior posterior displacement of the femoral component which is dislocated out of the acetabular component. There is also mildly displaced fractures of the anterior acetabular wall (series 3, image 53). There is likely intrapelvic migration of the acetabular component with adjacent acetabular roof fracture. Diffuse osteopenia. Ligaments Suboptimally assessed by CT. Muscles and Tendons Soft tissue swelling and likely intramuscular hematoma in the quadriceps femoris muscles measuring approximately 5.5 x 5.9 x 9.3 cm. There is also small hematoma about  the posterior aspect of the hip joint. Soft tissues Small subcutaneous fluid collection/hematoma in the subcutaneous soft tissues of the anterior thigh measuring approximately 1.7 x 6.1 x 8.9 cm. IMPRESSION: 1. Status post right hip arthroplasty. Posterior acetabular wall fracture with dislocation of the femoral component out of the acetabulum and superior posterior displacement of the femoral head. 2. There is also fracture of the anterior acetabular wall. 3. Fracture of the acetabular roof with mild migration of the acetabular component into the pelvis. 4. Large anterior hip hematoma about the quadriceps muscles measuring at least 5.5 x 5.9 cm. Small hematoma about the posterior aspect of the acetabular fracture. 5. Subcutaneous soft tissue hematoma/seroma about the anterior aspect of the thigh. Electronically Signed   By: Keane Police D.O.   On: 10/18/2021 14:30   DG Knee Right Port  Result Date: 10/18/2021 CLINICAL DATA:  Periprosthetic fracture. EXAM: PORTABLE RIGHT KNEE - 1-2 VIEW COMPARISON:  Right knee radiographs 09/25/2021 FINDINGS: No acute fracture, dislocation, or knee joint effusion is identified. There is mild medial compartment joint space narrowing. Mild marginal spurring is noted in the medial compartment and along the patella. Atherosclerotic vascular calcifications are noted. IMPRESSION: No acute osseous abnormality identified. Electronically Signed   By: Logan Bores M.D.   On: 10/18/2021 13:46   DG Hip Unilat W or Wo Pelvis 2-3 Views Right  Result Date: 10/18/2021 CLINICAL DATA:  Total right hip arthroplasty 09/26/2021. Postop complication. EXAM: DG HIP (WITH OR WITHOUT PELVIS) 2-3V RIGHT COMPARISON:  09/26/2021 FINDINGS: The acetabular cup has migrated since the prior study. The cup has migrated inferiorly. There is now uncovering of the superior femoral head prosthesis. In addition, there is significant protrusio of the prosthesis which could be due to medial acetabular fracture. This  was not present previously. Left hip joint normal.  No other pelvic lesion. IMPRESSION: Right hip replacement. Interval migration of the acetabular cup prosthesis inferiorly and medially. Probable fracture of the medial acetabulum. Recommend CT pelvis for further evaluation. Electronically Signed   By: Franchot Gallo M.D.   On: 10/18/2021 13:12        Scheduled Meds:  acetaminophen  1,000 mg Oral QID   dorzolamide  1 drop Right Eye BID   ferrous gluconate  324 mg Oral Q breakfast   latanoprost  1 drop Both Eyes QHS   levothyroxine  100 mcg Oral QAC breakfast   senna  1 tablet Oral BID   vitamin B-12  1,000 mcg Oral Daily   Continuous Infusions:  acetaminophen 1,000 mg (10/20/21 0630)     LOS: 2 days    Time spent: 41 minutes spent on chart review, discussion with nursing staff, consultants, updating family and interview/physical exam; more than 50% of that time was spent in counseling and/or coordination of care.    Sajad Glander J [place] DO [facility] Available via Epic secure chat 7am-7pm After these hours, please refer  to coverage provider listed on [email or web address] 10/20/2021, 9:51 AM

## 2021-10-20 NOTE — Consult Note (Signed)
ORTHOPAEDIC CONSULTATION  REQUESTING PHYSICIAN: [place] Donnamarie Poag, DO  PCP:  Gaynelle Arabian, MD  Chief Complaint: Right periprosthetic acetabulum fracture  HPI: Linda Copeland is a 88 y.o. female with advanced dementia who is known to me for primary right total hip arthroplasty on 09/26/2021 for displaced femoral neck fracture.  She was discharged to [facility] for rehab postoperatively.  Per her son's report, she was doing very well and fully participating with physical therapy for some time.  Suddenly, she became agitated and would not participate with therapy, and she refused to ambulate.  The son states that he received a call at night a couple of days later stating that his mother had "slipped."She came to the office earlier this week, and x-rays revealed a comminuted displaced periprosthetic acetabular fracture.  I immediately called her son and discussed the situation.  We discussed that the appearance of her x-rays was consistent with a fall.  She was then brought to the emergency department and admitted by the hospitalist.  While in the hospital, she has been very confused.  She has been refusing p.o. intake.  Past Medical History:  Diagnosis Date   Arthritis    bilateral severe facet L4-L5, L5-S1   Carpal tunnel syndrome of right wrist    Chronic cervical radiculopathy    C5   Compression fracture of lumbar vertebra (HCC)    L2-L3   Environmental and seasonal allergies    Frequent urination    Hematoma of frontal scalp 10/06/2016   History of bronchitis    History of humerus fracture 10/06/2016   Right proximal   History of subdural hematoma 10/06/2016   Left falcotentorial    HOH (hard of hearing)    Hypertension    Hypothyroidism    Nasal fracture 10/06/2016   OA (osteoarthritis)    AC glenohumeral joinr   Osteopenia    Spinal stenosis at L4-L5 level    Past Surgical History:  Procedure Laterality Date   CARPAL TUNNEL RELEASE Right 01/22/2018   Procedure:  RIGHT CARPAL TUNNEL RELEASE;  Surgeon: Daryll Brod, MD;  Location: [place];  Service: Orthopedics;  Laterality: Right;   COLONOSCOPY     TOTAL HIP ARTHROPLASTY Right 09/26/2021   Procedure: TOTAL HIP ARTHROPLASTY ANTERIOR APPROACH;  Surgeon: Rod Can, MD;  Location: [facility];  Service: Orthopedics;  Laterality: Right;   Social History   Socioeconomic History   Marital status: Widowed    Spouse name: Not on file   Number of children: Not on file   Years of education: Not on file   Highest education level: Not on file  Occupational History   Not on file  Tobacco Use   Smoking status: Never   Smokeless tobacco: Never  Vaping Use   Vaping Use: Never used  Substance and Sexual Activity   Alcohol use: Not Currently    Comment: rare    Drug use: Never   Sexual activity: Not on file  Other Topics Concern   Not on file  Social History Narrative   Not on file   Social Determinants of Health   Financial Resource Strain: Not on file  Food Insecurity: Not on file  Transportation Needs: Not on file  Physical Activity: Not on file  Stress: Not on file  Social Connections: Not on file   Family History  Problem Relation Age of Onset   Diabetes Son    Allergies  Allergen Reactions   Codeine Nausea And Vomiting and  Other (See Comments)    uknown   Biaxin [Clarithromycin] Other (See Comments)    Sweats   Caffeine     Shaky   Prior to Admission medications   Medication Sig Start Date End Date Taking? Authorizing Provider  acetaminophen (TYLENOL) 500 MG tablet Take 1,000 mg by mouth 2 (two) times daily.   Yes [provider]  alendronate (FOSAMAX) 70 MG tablet Take 70 mg by mouth once a week. Take with a full glass of water on an empty stomach.   Yes [provider]  aspirin (ASPIRIN CHILDRENS) 81 MG chewable tablet Chew 1 tablet (81 mg total) by mouth 2 (two) times daily. 09/27/21 11/08/21 Yes Cherlynn June B, PA  CALCIUM-VITAMIN D PO Take  1 tablet by mouth daily. 600mg -200mg    Yes [provider]  dorzolamide (TRUSOPT) 2 % ophthalmic solution Place 1 drop into the right eye 2 (two) times daily. 08/06/21  Yes [provider]  ferrous gluconate (FERGON) 324 MG tablet Take 324 mg by mouth daily with breakfast.   Yes [provider]  HYDROcodone-acetaminophen (NORCO/VICODIN) 5-325 MG tablet Take 1-2 tablets by mouth every 4 (four) hours as needed for moderate pain (pain score 4-6). Patient taking differently: Take 2 tablets by mouth every 4 (four) hours as needed for moderate pain (pain score 4-6). 09/27/21  Yes Cherlynn June B, PA  latanoprost (XALATAN) 0.005 % ophthalmic solution Place 1 drop into both eyes at bedtime.   Yes [provider]  levothyroxine (SYNTHROID, LEVOTHROID) 100 MCG tablet Take 100 mcg by mouth daily before breakfast. 09/09/16  Yes [provider]  lisinopril (ZESTRIL) 5 MG tablet Take 5 mg by mouth daily.   Yes [provider]  senna (SENOKOT) 8.6 MG TABS tablet Take 1 tablet (8.6 mg total) by mouth 2 (two) times daily. 09/29/21  Yes Mercy Riding, MD  vitamin B-12 (CYANOCOBALAMIN) 1000 MCG tablet Take 1 tablet (1,000 mcg total) by mouth daily. 09/30/21  Yes Mercy Riding, MD  acetaminophen (TYLENOL) 325 MG tablet Take 2 tablets (650 mg total) by mouth every 6 (six) hours as needed for mild pain, fever or headache. Patient not taking: Reported on 10/18/2021 09/29/21 09/29/22  Mercy Riding, MD  ibandronate (BONIVA) 150 MG tablet Take 150 mg by mouth every 30 (thirty) days. Patient not taking: Reported on 10/18/2021 01/26/19   [provider]  ondansetron (ZOFRAN) 4 MG tablet Take 1 tablet (4 mg total) by mouth every 6 (six) hours as needed for nausea. Patient not taking: Reported on 10/18/2021 09/29/21   Mercy Riding, MD  polyethylene glycol (MIRALAX / GLYCOLAX) 17 g packet Take 17 g by mouth 2 (two) times daily as needed for mild constipation. Patient  not taking: Reported on 10/18/2021 09/29/21   Mercy Riding, MD   CT HIP RIGHT WO CONTRAST  Result Date: 10/18/2021 CLINICAL DATA:  Hip surgical planning. Right hip shifting after replacement EXAM: CT OF THE RIGHT HIP WITHOUT CONTRAST TECHNIQUE: Multidetector CT imaging of the right hip was performed according to the standard protocol. Multiplanar CT image reconstructions were also generated. RADIATION DOSE REDUCTION: This exam was performed according to the departmental dose-optimization program which includes automated exposure control, adjustment of the mA and/or kV according to patient size and/or use of iterative reconstruction technique. COMPARISON:  None. FINDINGS: Bones/Joint/Cartilage There is fracture of the posterior acetabular wall with approximately 1.6 cm posterior displacement of the largest fracture fragment. There is superior posterior displacement of the femoral  component which is dislocated out of the acetabular component. There is also mildly displaced fractures of the anterior acetabular wall (series 3, image 53). There is likely intrapelvic migration of the acetabular component with adjacent acetabular roof fracture. Diffuse osteopenia. Ligaments Suboptimally assessed by CT. Muscles and Tendons Soft tissue swelling and likely intramuscular hematoma in the quadriceps femoris muscles measuring approximately 5.5 x 5.9 x 9.3 cm. There is also small hematoma about the posterior aspect of the hip joint. Soft tissues Small subcutaneous fluid collection/hematoma in the subcutaneous soft tissues of the anterior thigh measuring approximately 1.7 x 6.1 x 8.9 cm. IMPRESSION: 1. Status post right hip arthroplasty. Posterior acetabular wall fracture with dislocation of the femoral component out of the acetabulum and superior posterior displacement of the femoral head. 2. There is also fracture of the anterior acetabular wall. 3. Fracture of the acetabular roof with mild migration of the acetabular  component into the pelvis. 4. Large anterior hip hematoma about the quadriceps muscles measuring at least 5.5 x 5.9 cm. Small hematoma about the posterior aspect of the acetabular fracture. 5. Subcutaneous soft tissue hematoma/seroma about the anterior aspect of the thigh. Electronically Signed   By: Keane Police D.O.   On: 10/18/2021 14:30   DG Knee Right Port  Result Date: 10/18/2021 CLINICAL DATA:  Periprosthetic fracture. EXAM: PORTABLE RIGHT KNEE - 1-2 VIEW COMPARISON:  Right knee radiographs 09/25/2021 FINDINGS: No acute fracture, dislocation, or knee joint effusion is identified. There is mild medial compartment joint space narrowing. Mild marginal spurring is noted in the medial compartment and along the patella. Atherosclerotic vascular calcifications are noted. IMPRESSION: No acute osseous abnormality identified. Electronically Signed   By: Logan Bores M.D.   On: 10/18/2021 13:46   DG Hip Unilat W or Wo Pelvis 2-3 Views Right  Result Date: 10/18/2021 CLINICAL DATA:  Total right hip arthroplasty 09/26/2021. Postop complication. EXAM: DG HIP (WITH OR WITHOUT PELVIS) 2-3V RIGHT COMPARISON:  09/26/2021 FINDINGS: The acetabular cup has migrated since the prior study. The cup has migrated inferiorly. There is now uncovering of the superior femoral head prosthesis. In addition, there is significant protrusio of the prosthesis which could be due to medial acetabular fracture. This was not present previously. Left hip joint normal.  No other pelvic lesion. IMPRESSION: Right hip replacement. Interval migration of the acetabular cup prosthesis inferiorly and medially. Probable fracture of the medial acetabulum. Recommend CT pelvis for further evaluation. Electronically Signed   By: Franchot Gallo M.D.   On: 10/18/2021 13:12    Positive ROS: All other systems have been reviewed and were otherwise negative with the exception of those mentioned in the HPI and as above.  Physical Exam: General: Alert, no  acute distress Cardiovascular: No pedal edema Respiratory: No cyanosis, no use of accessory musculature GI: No organomegaly, abdomen is soft and non-tender Skin: No lesions in the area of chief complaint Neurologic: Sensation intact distally Psychiatric: She is restless and confused Lymphatic: No axillary or cervical lymphadenopathy  MUSCULOSKELETAL: Examination of the right hip reveals a benign incision.  She is shortened and rotated.  She does not cooperate with motor and sensory exam.  The foot is well-perfused.  Assessment: Displaced periprosthetic right acetabular fracture Status post right total hip arthroplasty for displaced femoral neck fracture Advanced dementia  Plan: I discussed the findings with the patient's son.  We had her on the schedule for surgery today.  She needs revision acetabular component.  We discussed that the postop recovery will require prolonged  protected weightbearing due to compromised bone stock.  She has been refusing p.o. intake and has been very confused and agitated.  I am very concerned about her prognosis and her ability to recover from the surgery.  Specifically, putting her through a big operation that she will not be able to recover from is not in her best interest.  After prolonged discussion with her son, he wishes to cancel surgery and proceed comfort care.  This is a very difficult decision, but I believe that this is the correct thing to do.  Today's surgery will be cancelled. All questions were solicited and answered.   Bertram Savin, MD [phone]    10/20/2021 7:36 AM

## 2021-10-21 DIAGNOSIS — R410 Disorientation, unspecified: Secondary | ICD-10-CM | POA: Diagnosis present

## 2021-10-21 MED ORDER — MORPHINE SULFATE (CONCENTRATE) 10 MG/0.5ML PO SOLN
10.0000 mg | Freq: Four times a day (QID) | ORAL | Status: DC
  Administered 2021-10-21 – 2021-10-22 (×4): 10 mg via SUBLINGUAL
  Filled 2021-10-21 (×5): qty 0.5

## 2021-10-21 MED ORDER — HALOPERIDOL LACTATE 2 MG/ML PO CONC
2.0000 mg | ORAL | Status: DC | PRN
  Filled 2021-10-21: qty 1

## 2021-10-21 MED ORDER — BISACODYL 10 MG RE SUPP
10.0000 mg | Freq: Every day | RECTAL | Status: DC | PRN
  Filled 2021-10-21: qty 1

## 2021-10-21 MED ORDER — MAGNESIUM HYDROXIDE 400 MG/5ML PO SUSP
960.0000 mL | Freq: Once | ORAL | Status: DC
  Filled 2021-10-21: qty 473

## 2021-10-21 MED ORDER — MORPHINE SULFATE (CONCENTRATE) 10 MG/0.5ML PO SOLN: 10.0000 mg | ORAL | Status: DC | PRN

## 2021-10-21 MED ORDER — ACETAMINOPHEN 160 MG/5ML PO SOLN
650.0000 mg | Freq: Three times a day (TID) | ORAL | Status: DC
  Administered 2021-10-21 – 2021-10-22 (×3): 650 mg via ORAL
  Filled 2021-10-21 (×4): qty 20.3

## 2021-10-21 MED ORDER — LORAZEPAM 2 MG/ML PO CONC: 1.0000 mg | ORAL | Status: DC | PRN

## 2021-10-21 NOTE — Progress Notes (Signed)
Palliative Care Progress Note  Linda Copeland is agitated and yelling out from her room. Per staff she has been yelling frequently and intermittently throughout the day. Her IV access was lost and she has not been able to get IV medications for pain and agitation. Waiting on pharmacy to send up SL haldol. I clarified plan for comfort care due to a dramatic and extensive traumatic hip fracture with intrapelvic acetabulum on imaging and a nearly 10cmX10cm hematom/swelling. She has most pain when she moves or is moved- RN noted swelling and redness that extends down into her perineal and rectal area.  Linda Copeland could not have clear or coherent conversation-she is current refusing care interventions and her agitation is escalating-suspect worsening due to Sundowning pattern as well.  Plan: Will not replace IV access. Will start Morphine 10mg  q6 hours SL/PO (does not take pills but can do sublingual liquids). Recommend giving additional 10mg  15 -30 min prior to moving or repositioning. Haldol 2mg  SL q4hours PRN for agitation Schedule ODT zyprexa QHS  Patient is taking in very little PO intake, she is high risk for aspiration.   [name], DO Palliative Medicine  Time: 55 min

## 2021-10-21 NOTE — TOC Progression Note (Signed)
Transition of Care [facility]) - Progression Note   Patient Details  Name: Lera Gaines MRN: [id number] Date of Birth: 09/26/1931  [facility]) CM/SW [place], LCSW Phone Number: 10/21/2021, 12:46 PM  Clinical Narrative: CSW received call from Bevely Palmer with [facility] and was notified the patient does not quite meet criteria for residential hospice at [facility] at this time. CSW spoke with the son, [name] [place], about home with hospice or SNF with hospice. Per son, he does not have the funds to private pay for room and board at a facility. Son to discuss options with his brother to see if they can work out a schedule with someone being with her at home. Son is aware that hospice does not provide 24/7 care in the home, so family would need to pay for private duty care if it is needed. Hospitalist updated.  Expected Discharge Plan: [place] Barriers to Discharge: Hospice Bed not available  Expected Discharge Plan and Services Expected Discharge Plan: [place] In-house Referral: Clinical Social Work Post Acute Care Choice: Residential Hospice Bed Living arrangements for the past 2 months: Single Family Home             DME Arranged: N/A DME Agency: NA  Readmission Risk Interventions No flowsheet data found.

## 2021-10-21 NOTE — Progress Notes (Signed)
[employer] [facility]) Hospital Liaison note.     This patient is approved to transfer to [facility] but son, [name] declined to accept bed today. [name] has concerns about the room and board fee. We will provide a financial assessment to determine the actual fee so he can make an informed decision. Hospital liaison will follow up with this family tomorrow morning.  Thank you,     Farrel Gordon, RN, [place] Hospital Liaison  [phone]

## 2021-10-21 NOTE — Progress Notes (Signed)
[facility]) Hospital Liaison Note  This patient has been reviewed by Dr. Orpah Melter with [facility]. Per Dr. Tomasa Hosteller, she has little symptom management requirements presently and a prognosis of greater than 2 weeks.  She is  not [employer] eligible.  She is hospice eligible in SNF or home based on her dementia and now broken hip.  A Please do not hesitate to call with questions.     Thank you,     Farrel Gordon, RN, [place]

## 2021-10-21 NOTE — Progress Notes (Signed)
PROGRESS NOTE    Linda Copeland  VOH:[id number] DOB: 16-Jul-1931 DOA: 10/18/2021 PCP: Gaynelle Arabian, MD    Brief Narrative:  Linda Copeland is a 88 year old female with past medical history significant for dementia, essential hypertension, osteopenia, history of vertebral compression fracture, glaucoma, hypothyroidism, recent right femoral neck fracture s/p repair on 09/26/2021 by Dr. Lyla Glassing who presents to [facility] ED from SNF with right hip pain and deformity.  Patient unable to provide history due to advanced dementia; and obtained from [place] and son at bedside.  Patient with reported fall at SNF from wheelchair to ground-level on 10/16/2021.  Went to her orthopedic surgeon for routine postsurgical appointment and was noted to have a right hip deformity.  Right hip x-ray was done notable for right acetabulum fracture and sent to the ED for further evaluation.  In the ED, temperature 98.2 F, BP 116/55, HR 78, RR 22, SPO2 95% on room air.  Sodium 135, potassium 3.5, chloride 103, CO2 26, glucose 107, BUN 15, creatinine 0.31.  Alkaline phosphatase 157, AST 21, ALT 15, total bilirubin 0.3.  WBC 11.1, hemoglobin 9.6, platelets 356.  COVID-19 PCR negative.  Influenza A/B PCR negative.  Urinalysis unrevealing.  Right knee x-ray with no acute osseous abnormality.  CT right hip with posterior acetabular wall fracture with dislocation of the femoral component and superior/posterior displacement of the femoral head, large anterior hip hematoma.  EDP discussed with orthopedics, plan operative management on 10/20/2021.  Hospital service consulted for further evaluation and management of hip fracture.   Assessment & Plan:   Principal Problem:   Acetabulum fracture, right (HCC) Active Problems:   Primary open angle glaucoma (POAG) of both eyes, severe stage   Hypertension   Hypothyroidism   Dementia without behavioral disturbance ([place])   Acetabulum fracture, right  Patient presenting to ED from orthopedics  office following fall at SNF with subsequent right acetabular fracture.  Patient was evaluated by orthopedics, Dr. Lyla Glassing and seen by palliative care.  Discussion with patient's family given her significant decline, frailty, failure to thrive and likely poor outcome with surgical invention; it was determined the best course of action was to focus on a more comfort/palliative approach rather than aggressive surgical intervention.  Patient now transitioned to comfort measures. --Tylenol 1000 mg QID --Morphine 20 mg SL q1h PRN moderate pain --Dilaudid 1 mg IV q2h PRN severe pain --[employer] evaluating for residential hospice placement  Anemia Hematoma, right anterior hip Hemoglobin 9.6>8.8>9.9 --s/p 1 unit PRBC 1/18 --Continue ferrous gluconate 324 mg p.o. daily -- Avoiding further lab draws now on comfort measures  Primary open angle glaucoma (POAG) of both eyes, severe stage --Continue home latanoprost and Trusopt eyedrops  Hypertension Holding home lisinopril, now on comfort measures  Hypothyroidism Levothyroxine 100 mcg p.o. daily  Dementia without behavioral disturbance --Delirium precautions --Get up during the day --Encourage a familiar face to remain present throughout the day --Keep blinds open and lights on during daylight hours --Haldol 2 mg p.o. every 8 hours as needed agitation --Ativan 1 mg IV every 6 hours as needed anxiety/agitation   DVT prophylaxis: Place and maintain sequential compression device Start: 10/18/21 1649   Code Status: DNR Family Communication: No family present at bedside this morning, updated patient's son [name] via telephone yesterday morning  Disposition Plan:  Level of care: Telemetry Status is: Inpatient  Remains inpatient appropriate because: [facility] currently evaluating for residential hospice placement.   Consultants:  [facility]; Dr. Lyla Glassing  Procedures:  None  Antimicrobials:  None  Subjective: Patient seen  examined bedside, resting comfortably.  Pleasantly confused.  Just had a bed bath completed by nurse tech.  States "take me home".  No family present at bedside this morning. Unable to assess further ROS due to her underlying advanced dementia.  No acute concerns per nursing staff this morning.  On comfort measures awaiting residential hospice evaluation.  Objective: Vitals:   10/20/21 0407 10/20/21 0500 10/20/21 1206 10/21/21 0500  BP: 119/70  116/68 (!) 145/84  Pulse: 64  71 71  Resp: 18  20 18   Temp: (!) 97.5 F (36.4 C) 98.1 F (36.7 C) 97.7 F (36.5 C) 98.1 F (36.7 C)  TempSrc: Oral Oral  Axillary  SpO2: 96%  98% 97%  Weight:      Height:        Intake/Output Summary (Last 24 hours) at 10/21/2021 1051 Last data filed at 10/21/2021 0500 Gross per 24 hour  Intake 60 ml  Output 800 ml  Net -740 ml   Filed Weights   10/18/21 1155 10/19/21 1143  Weight: 65 kg 65 kg    Examination:  General exam: Appears calm and comfortable, pleasantly confused, elderly in appearance Respiratory system: Clear to auscultation. Respiratory effort normal.  On room air Cardiovascular system: S1 & S2 heard, RRR. No JVD, murmurs, rubs, gallops or clicks. No pedal edema. Gastrointestinal system: Abdomen is nondistended, soft and nontender. No organomegaly or masses felt. Normal bowel sounds heard. Central nervous system: Alert and oriented. No focal neurological deficits. Extremities: Right hip internally rotated and shortened, otherwise moves all extremities independently Skin: No rashes, lesions or ulcers Psychiatry: Judgement and insight appear poor. Mood & affect appropriate.     Data Reviewed: I have personally reviewed following labs and imaging studies  CBC: Recent Labs  Lab 10/18/21 1323 10/19/21 0500 10/20/21 0436  WBC 11.1* 6.6 6.3  NEUTROABS 8.7*  --   --   HGB 9.6* 8.8* 9.9*  HCT 30.3* 29.0* 31.2*  MCV 98.1 100.3* 94.8  PLT 356 299 825   Basic Metabolic Panel: Recent  Labs  Lab 10/18/21 1323 10/19/21 0500 10/20/21 0436  NA 135 137 135  K 3.5 3.8 3.8  CL 103 107 104  CO2 26 25 23   GLUCOSE 107* 91 88  BUN 15 11 10   CREATININE 0.31* 0.36* <0.30*  CALCIUM 8.3* 7.8* 8.2*  MG  --  2.0  --   PHOS  --  3.0  --    GFR: CrCl cannot be calculated (This lab value cannot be used to calculate CrCl because it is not a number: <0.30). Liver Function Tests: Recent Labs  Lab 10/18/21 1323  AST 21  ALT 15  ALKPHOS 157*  BILITOT 0.3  PROT 7.2  ALBUMIN 3.2*   No results for input(s): LIPASE, AMYLASE in the last 168 hours. No results for input(s): AMMONIA in the last 168 hours. Coagulation Profile: No results for input(s): INR, PROTIME in the last 168 hours. Cardiac Enzymes: No results for input(s): CKTOTAL, CKMB, CKMBINDEX, TROPONINI in the last 168 hours. BNP (last 3 results) No results for input(s): PROBNP in the last 8760 hours. HbA1C: No results for input(s): HGBA1C in the last 72 hours. CBG: No results for input(s): GLUCAP in the last 168 hours. Lipid Profile: No results for input(s): CHOL, HDL, LDLCALC, TRIG, CHOLHDL, LDLDIRECT in the last 72 hours. Thyroid Function Tests: No results for input(s): TSH, T4TOTAL, FREET4, T3FREE, THYROIDAB in the last 72 hours. Anemia Panel: Recent Labs    10/19/21  0500 10/19/21 1444  VITAMINB12  --  630  FOLATE  --  11.2  FERRITIN  --  219  TIBC  --  237*  IRON  --  58  RETICCTPCT 3.8*  --    Sepsis Labs: No results for input(s): PROCALCITON, LATICACIDVEN in the last 168 hours.  Recent Results (from the past 240 hour(s))  Resp Panel by RT-PCR (Flu A&B, Covid) Nasopharyngeal Swab     Status: None   Collection Time: 10/18/21  1:23 PM   Specimen: Nasopharyngeal Swab; Nasopharyngeal(NP) swabs in vial transport medium  Result Value Ref Range Status   SARS Coronavirus 2 by RT PCR NEGATIVE NEGATIVE Final    Comment: (NOTE) SARS-CoV-2 target nucleic acids are NOT DETECTED.  The SARS-CoV-2 RNA is  generally detectable in upper respiratory specimens during the acute phase of infection. The lowest concentration of SARS-CoV-2 viral copies this assay can detect is 138 copies/mL. A negative result does not preclude SARS-Cov-2 infection and should not be used as the sole basis for treatment or other patient management decisions. A negative result may occur with  improper specimen collection/handling, submission of specimen other than nasopharyngeal swab, presence of viral mutation(s) within the areas targeted by this assay, and inadequate number of viral copies(<138 copies/mL). A negative result must be combined with clinical observations, patient history, and epidemiological information. The expected result is Negative.  Fact Sheet for Patients:  EntrepreneurPulse.com.au  Fact Sheet for Healthcare Providers:  IncredibleEmployment.be  This test is no t yet approved or cleared by the [place] FDA and  has been authorized for detection and/or diagnosis of SARS-CoV-2 by FDA under an Emergency Use Authorization (EUA). This EUA will remain  in effect (meaning this test can be used) for the duration of the COVID-19 declaration under Section 564(b)(1) of the Act, 21 U.S.C.section 360bbb-3(b)(1), unless the authorization is terminated  or revoked sooner.       Influenza A by PCR NEGATIVE NEGATIVE Final   Influenza B by PCR NEGATIVE NEGATIVE Final    Comment: (NOTE) The Xpert Xpress SARS-CoV-2/FLU/RSV plus assay is intended as an aid in the diagnosis of influenza from Nasopharyngeal swab specimens and should not be used as a sole basis for treatment. Nasal washings and aspirates are unacceptable for Xpert Xpress SARS-CoV-2/FLU/RSV testing.  Fact Sheet for Patients: EntrepreneurPulse.com.au  Fact Sheet for Healthcare Providers: IncredibleEmployment.be  This test is not yet approved or cleared by the [place] FDA and has been authorized for detection and/or diagnosis of SARS-CoV-2 by FDA under an Emergency Use Authorization (EUA). This EUA will remain in effect (meaning this test can be used) for the duration of the COVID-19 declaration under Section 564(b)(1) of the Act, 21 U.S.C. section 360bbb-3(b)(1), unless the authorization is terminated or revoked.  Performed at [facility], [place] [address], [place], [place] [address]          Radiology Studies: No results found.      Scheduled Meds:  acetaminophen  1,000 mg Oral QID   dorzolamide  1 drop Right Eye BID   ferrous gluconate  324 mg Oral Q breakfast   latanoprost  1 drop Both Eyes QHS   levothyroxine  100 mcg Oral QAC breakfast   senna  1 tablet Oral BID   vitamin B-12  1,000 mcg Oral Daily   Continuous Infusions:     LOS: 3 days    Time spent: 41 minutes spent on chart review, discussion with nursing staff, consultants, updating family and  interview/physical exam; more than 50% of that time was spent in counseling and/or coordination of care.    Jahna Liebert J [place] DO [facility] Available via Epic secure chat 7am-7pm After these hours, please refer to coverage provider listed on [email or web address] 10/21/2021, 10:51 AM

## 2021-10-22 DIAGNOSIS — R410 Disorientation, unspecified: Secondary | ICD-10-CM

## 2021-10-22 DIAGNOSIS — Z515 Encounter for palliative care: Secondary | ICD-10-CM

## 2021-10-22 LAB — TYPE AND SCREEN
ABO/RH(D): O POS
Antibody Screen: NEGATIVE
Unit division: 0
Unit division: 0
Unit division: 0

## 2021-10-22 LAB — BPAM RBC
Blood Product Expiration Date: 202301232359
Blood Product Expiration Date: 202302162359
Blood Product Expiration Date: 202302162359
ISSUE DATE / TIME: 202301181527
Unit Type and Rh: 5100
Unit Type and Rh: 5100
Unit Type and Rh: 5100

## 2021-10-22 MED ORDER — MORPHINE SULFATE (CONCENTRATE) 10 MG/0.5ML PO SOLN: 20.0000 mg | ORAL | 0 refills | Status: AC | PRN

## 2021-10-22 MED ORDER — MORPHINE SULFATE (CONCENTRATE) 10 MG/0.5ML PO SOLN: 10.0000 mg | Freq: Four times a day (QID) | ORAL | 0 refills | Status: AC

## 2021-10-22 MED ORDER — HALOPERIDOL LACTATE 2 MG/ML PO CONC: 2.0000 mg | ORAL | 0 refills | Status: AC | PRN

## 2021-10-22 MED ORDER — ACETAMINOPHEN 160 MG/5ML PO SOLN: 650.0000 mg | Freq: Three times a day (TID) | ORAL | 0 refills | Status: AC

## 2021-10-22 NOTE — Discharge Summary (Signed)
Physician Discharge Summary  Linda Copeland DDU:[id number] DOB: 05/18/1931 DOA: 10/18/2021  PCP: Gaynelle Arabian, MD  Admit date: 10/18/2021 Discharge date: 10/22/2021  Admitted From: SNF Disposition: [place] residential hospice  Recommendations for Outpatient Follow-up:  Follow-up with hospice provider  Discharge Condition: Grim/poor CODE STATUS: DNR Diet recommendation: Comfort feeds as tolerates  History of present illness:  Linda Copeland is a 88 year old female with past medical history significant for dementia, essential hypertension, osteopenia, history of vertebral compression fracture, glaucoma, hypothyroidism, recent right femoral neck fracture s/p repair on 09/26/2021 by Dr. Lyla Glassing who presents to [facility] ED from SNF with right hip pain and deformity.  Patient unable to provide history due to advanced dementia; and obtained from [place] and son at bedside.  Patient with reported fall at SNF from wheelchair to ground-level on 10/16/2021.  Went to her orthopedic surgeon for routine postsurgical appointment and was noted to have a right hip deformity.  Right hip x-ray was done notable for right acetabulum fracture and sent to the ED for further evaluation.   In the ED, temperature 98.2 F, BP 116/55, HR 78, RR 22, SPO2 95% on room air.  Sodium 135, potassium 3.5, chloride 103, CO2 26, glucose 107, BUN 15, creatinine 0.31.  Alkaline phosphatase 157, AST 21, ALT 15, total bilirubin 0.3.  WBC 11.1, hemoglobin 9.6, platelets 356.  COVID-19 PCR negative.  Influenza A/B PCR negative.  Urinalysis unrevealing.  Right knee x-ray with no acute osseous abnormality.  CT right hip with posterior acetabular wall fracture with dislocation of the femoral component and superior/posterior displacement of the femoral head, large anterior hip hematoma.  EDP discussed with orthopedics, plan operative management on 10/20/2021.  Hospitalist service consulted for further evaluation and management of hip  fracture.  Hospital course:  Acetabulum fracture, right  Patient presenting to ED from orthopedics office following fall at SNF with subsequent right acetabular fracture.  Patient was evaluated by orthopedics, Dr. Lyla Glassing and seen by palliative care.  Discussion with patient's family given her significant decline, frailty, failure to thrive and likely poor outcome with surgical invention; it was determined the best course of action was to focus on a more comfort/palliative approach rather than aggressive surgical intervention.  Patient now transitioned to comfort measures.  Continue Tylenol, oral morphine, Haldol as needed for pain, anxiety, agitation control.  Discharging to [facility] residential hospice today.   Anemia Hematoma, right anterior hip Transfuse 1 unit PRBC on 10/19/2021.  Discontinued home ferrous gluconate as now on comfort measures.   Primary open angle glaucoma (POAG) of both eyes, severe stage Continue home latanoprost and Trusopt eyedrops   Hypertension Discontinued home lisinopril, now on comfort measures   Hypothyroidism Continue home levothyroxine 100 mcg p.o. daily now that on comfort measures   Dementia without behavioral disturbance Haldol as needed for agitation.    Discharge Diagnoses:  Principal Problem:   Acetabulum fracture, right ([place]) Active Problems:   Primary open angle glaucoma (POAG) of both eyes, severe stage   Hypertension   Hypothyroidism   Dementia without behavioral disturbance ([place])   Acute delirium   Hospice care patient   Palliative care patient    Discharge Instructions  Discharge Instructions     Diet - low sodium heart healthy   Complete by: As directed    Increase activity slowly   Complete by: As directed       Allergies as of 10/22/2021       Reactions   Codeine Nausea And Vomiting, Other (See Comments)  uknown   Biaxin [clarithromycin] Other (See Comments)   Sweats   Caffeine    Shaky         Medication List     STOP taking these medications    acetaminophen 325 MG tablet Commonly known as: Tylenol Replaced by: acetaminophen 160 MG/5ML solution   acetaminophen 500 MG tablet Commonly known as: TYLENOL   alendronate 70 MG tablet Commonly known as: FOSAMAX   aspirin 81 MG chewable tablet Commonly known as: Aspirin Childrens   CALCIUM-VITAMIN D PO   ferrous gluconate 324 MG tablet Commonly known as: FERGON   HYDROcodone-acetaminophen 5-325 MG tablet Commonly known as: NORCO/VICODIN   ibandronate 150 MG tablet Commonly known as: BONIVA   levothyroxine 100 MCG tablet Commonly known as: SYNTHROID   lisinopril 5 MG tablet Commonly known as: ZESTRIL   ondansetron 4 MG tablet Commonly known as: ZOFRAN   polyethylene glycol 17 g packet Commonly known as: MIRALAX / GLYCOLAX   senna 8.6 MG Tabs tablet Commonly known as: SENOKOT   vitamin B-12 1000 MCG tablet Commonly known as: CYANOCOBALAMIN       TAKE these medications    acetaminophen 160 MG/5ML solution Commonly known as: TYLENOL Take 20.3 mLs (650 mg total) by mouth every 8 (eight) hours. Replaces: acetaminophen 325 MG tablet   dorzolamide 2 % ophthalmic solution Commonly known as: TRUSOPT Place 1 drop into the right eye 2 (two) times daily.   haloperidol 2 MG/ML solution Commonly known as: HALDOL Take 1 mL (2 mg total) by mouth every 4 (four) hours as needed for agitation.   latanoprost 0.005 % ophthalmic solution Commonly known as: XALATAN Place 1 drop into both eyes at bedtime.   morphine CONCENTRATE 10 MG/0.5ML Soln concentrated solution Place 0.5 mLs (10 mg total) under the tongue every 6 (six) hours.   morphine CONCENTRATE 10 MG/0.5ML Soln concentrated solution Place 1 mL (20 mg total) under the tongue every hour as needed for moderate pain, severe pain, anxiety or shortness of breath.        Allergies  Allergen Reactions   Codeine Nausea And Vomiting and Other (See Comments)     uknown   Biaxin [Clarithromycin] Other (See Comments)    Sweats   Caffeine     Shaky    Consultations: Orthopedics, Dr. Lyla Glassing Palliative care   Procedures/Studies: DG Chest 1 View  Result Date: 09/25/2021 CLINICAL DATA:  Fall and confusion EXAM: CHEST  1 VIEW COMPARISON:  07/09/2019 FINDINGS: The heart size and mediastinal contours are within normal limits. Both lungs are clear. The visualized skeletal structures are unremarkable. IMPRESSION: No active disease. Electronically Signed   By: Ulyses Jarred M.D.   On: 09/25/2021 03:03   CT Head Wo Contrast  Result Date: 09/25/2021 CLINICAL DATA:  Recent fall several hours ago with headaches and neck pain, initial encounter EXAM: CT HEAD WITHOUT CONTRAST CT CERVICAL SPINE WITHOUT CONTRAST TECHNIQUE: Multidetector CT imaging of the head and cervical spine was performed following the standard protocol without intravenous contrast. Multiplanar CT image reconstructions of the cervical spine were also generated. COMPARISON:  None. FINDINGS: CT HEAD FINDINGS Brain: No evidence of acute infarction, hemorrhage, hydrocephalus, extra-axial collection or mass lesion/mass effect. Chronic atrophic and ischemic changes are noted. Vascular: No hyperdense vessel or unexpected calcification. Skull: Normal. Negative for fracture or focal lesion. Sinuses/Orbits: No acute finding. Other: None. CT CERVICAL SPINE FINDINGS Alignment: Mild degenerative anterolisthesis of C7 on T1 is noted. Skull base and vertebrae: 7 cervical segments are well visualized.  Vertebral body height is well maintained. Irregularity along the superior aspect of the C6 vertebral body is noted with associated osteophytic changes consistent with degenerative change. No compression deformity is noted. Facet hypertrophic changes are noted. No acute fracture or acute facet abnormality is noted. Soft tissues and spinal canal: Surrounding soft tissue structures are within normal limits. Upper chest:  Visualized lung apices demonstrate a small 3-4 mm nodule in the right upper lobe best seen on image number 72 of series 6. Other: None IMPRESSION: CT of the head: Chronic atrophic and ischemic changes. CT of cervical spine: Multilevel degenerative change. 3-4 mm nodule in the right upper lobe. No follow-up needed if patient is low-risk. Non-contrast chest CT can be considered in 12 months if patient is high-risk. This recommendation follows the consensus statement: Guidelines for Management of Incidental Pulmonary Nodules Detected on CT Images: From the Fleischner Society 2017; Radiology 2017; 284:228-243. Electronically Signed   By: Inez Catalina M.D.   On: 09/25/2021 03:19   CT Cervical Spine Wo Contrast  Result Date: 09/25/2021 CLINICAL DATA:  Recent fall several hours ago with headaches and neck pain, initial encounter EXAM: CT HEAD WITHOUT CONTRAST CT CERVICAL SPINE WITHOUT CONTRAST TECHNIQUE: Multidetector CT imaging of the head and cervical spine was performed following the standard protocol without intravenous contrast. Multiplanar CT image reconstructions of the cervical spine were also generated. COMPARISON:  None. FINDINGS: CT HEAD FINDINGS Brain: No evidence of acute infarction, hemorrhage, hydrocephalus, extra-axial collection or mass lesion/mass effect. Chronic atrophic and ischemic changes are noted. Vascular: No hyperdense vessel or unexpected calcification. Skull: Normal. Negative for fracture or focal lesion. Sinuses/Orbits: No acute finding. Other: None. CT CERVICAL SPINE FINDINGS Alignment: Mild degenerative anterolisthesis of C7 on T1 is noted. Skull base and vertebrae: 7 cervical segments are well visualized. Vertebral body height is well maintained. Irregularity along the superior aspect of the C6 vertebral body is noted with associated osteophytic changes consistent with degenerative change. No compression deformity is noted. Facet hypertrophic changes are noted. No acute fracture or  acute facet abnormality is noted. Soft tissues and spinal canal: Surrounding soft tissue structures are within normal limits. Upper chest: Visualized lung apices demonstrate a small 3-4 mm nodule in the right upper lobe best seen on image number 72 of series 6. Other: None IMPRESSION: CT of the head: Chronic atrophic and ischemic changes. CT of cervical spine: Multilevel degenerative change. 3-4 mm nodule in the right upper lobe. No follow-up needed if patient is low-risk. Non-contrast chest CT can be considered in 12 months if patient is high-risk. This recommendation follows the consensus statement: Guidelines for Management of Incidental Pulmonary Nodules Detected on CT Images: From the Fleischner Society 2017; Radiology 2017; 284:228-243. Electronically Signed   By: Inez Catalina M.D.   On: 09/25/2021 03:19   Pelvis Portable  Result Date: 09/26/2021 CLINICAL DATA:  Right hip replacement EXAM: PORTABLE PELVIS 1-2 VIEWS COMPARISON:  None. FINDINGS: Changes of right hip replacement. Normal AP alignment. No hardware bony complicating feature. IMPRESSION: Right hip replacement.  No visible complicating feature. Electronically Signed   By: Rolm Baptise M.D.   On: 09/26/2021 15:42   CT HIP RIGHT WO CONTRAST  Result Date: 10/18/2021 CLINICAL DATA:  Hip surgical planning. Right hip shifting after replacement EXAM: CT OF THE RIGHT HIP WITHOUT CONTRAST TECHNIQUE: Multidetector CT imaging of the right hip was performed according to the standard protocol. Multiplanar CT image reconstructions were also generated. RADIATION DOSE REDUCTION: This exam was performed according to the  departmental dose-optimization program which includes automated exposure control, adjustment of the mA and/or kV according to patient size and/or use of iterative reconstruction technique. COMPARISON:  None. FINDINGS: Bones/Joint/Cartilage There is fracture of the posterior acetabular wall with approximately 1.6 cm posterior displacement of  the largest fracture fragment. There is superior posterior displacement of the femoral component which is dislocated out of the acetabular component. There is also mildly displaced fractures of the anterior acetabular wall (series 3, image 53). There is likely intrapelvic migration of the acetabular component with adjacent acetabular roof fracture. Diffuse osteopenia. Ligaments Suboptimally assessed by CT. Muscles and Tendons Soft tissue swelling and likely intramuscular hematoma in the quadriceps femoris muscles measuring approximately 5.5 x 5.9 x 9.3 cm. There is also small hematoma about the posterior aspect of the hip joint. Soft tissues Small subcutaneous fluid collection/hematoma in the subcutaneous soft tissues of the anterior thigh measuring approximately 1.7 x 6.1 x 8.9 cm. IMPRESSION: 1. Status post right hip arthroplasty. Posterior acetabular wall fracture with dislocation of the femoral component out of the acetabulum and superior posterior displacement of the femoral head. 2. There is also fracture of the anterior acetabular wall. 3. Fracture of the acetabular roof with mild migration of the acetabular component into the pelvis. 4. Large anterior hip hematoma about the quadriceps muscles measuring at least 5.5 x 5.9 cm. Small hematoma about the posterior aspect of the acetabular fracture. 5. Subcutaneous soft tissue hematoma/seroma about the anterior aspect of the thigh. Electronically Signed   By: Keane Police D.O.   On: 10/18/2021 14:30   DG Knee Right Port  Result Date: 10/18/2021 CLINICAL DATA:  Periprosthetic fracture. EXAM: PORTABLE RIGHT KNEE - 1-2 VIEW COMPARISON:  Right knee radiographs 09/25/2021 FINDINGS: No acute fracture, dislocation, or knee joint effusion is identified. There is mild medial compartment joint space narrowing. Mild marginal spurring is noted in the medial compartment and along the patella. Atherosclerotic vascular calcifications are noted. IMPRESSION: No acute osseous  abnormality identified. Electronically Signed   By: Logan Bores M.D.   On: 10/18/2021 13:46   DG Knee Right Port  Result Date: 09/25/2021 CLINICAL DATA:  Recent fall with known right hip fracture, initial encounter EXAM: PORTABLE RIGHT KNEE - 2 VIEW COMPARISON:  None. FINDINGS: No acute fracture or dislocation is noted. Mild degenerative changes are noted. No joint effusion is seen. IMPRESSION: Mild degenerative change without acute abnormality. Electronically Signed   By: Inez Catalina M.D.   On: 09/25/2021 22:10   DG C-Arm 1-60 Min-No Report  Result Date: 09/26/2021 Fluoroscopy was utilized by the requesting physician.  No radiographic interpretation.   DG C-Arm 1-60 Min-No Report  Result Date: 09/26/2021 Fluoroscopy was utilized by the requesting physician.  No radiographic interpretation.   DG HIP OPERATIVE UNILAT W OR W/O PELVIS RIGHT  Result Date: 09/26/2021 CLINICAL DATA:  Right hip arthroplasty EXAM: OPERATIVE RIGHT HIP (WITH PELVIS IF PERFORMED)  VIEWS TECHNIQUE: Fluoroscopic spot image(s) were submitted for interpretation post-operatively. COMPARISON:  None. FLUOROSCOPY TIME:  00:06 FINDINGS: Intraoperative fluoroscopic images of the right hip demonstrate total hip arthroplasty. No obvious perihardware fracture or component malpositioning. IMPRESSION: Intraoperative fluoroscopic images of the right hip demonstrate total hip arthroplasty. No obvious perihardware fracture or component malpositioning. Electronically Signed   By: Delanna Ahmadi M.D.   On: 09/26/2021 15:08   DG Hip Unilat W or Wo Pelvis 2-3 Views Right  Result Date: 10/18/2021 CLINICAL DATA:  Total right hip arthroplasty 09/26/2021. Postop complication. EXAM: DG HIP (WITH OR WITHOUT  PELVIS) 2-3V RIGHT COMPARISON:  09/26/2021 FINDINGS: The acetabular cup has migrated since the prior study. The cup has migrated inferiorly. There is now uncovering of the superior femoral head prosthesis. In addition, there is significant  protrusio of the prosthesis which could be due to medial acetabular fracture. This was not present previously. Left hip joint normal.  No other pelvic lesion. IMPRESSION: Right hip replacement. Interval migration of the acetabular cup prosthesis inferiorly and medially. Probable fracture of the medial acetabulum. Recommend CT pelvis for further evaluation. Electronically Signed   By: Franchot Gallo M.D.   On: 10/18/2021 13:12   DG Hip Unilat W or Wo Pelvis 2-3 Views Right  Result Date: 09/25/2021 CLINICAL DATA:  Fall and right hip pain. EXAM: DG HIP (WITH OR WITHOUT PELVIS) 2-3V RIGHT COMPARISON:  Right hip radiograph dated 01/11/2021. FINDINGS: There is a fracture of the right femoral neck with mild proximal migration and impaction of the femoral shaft. No dislocation. The bones are osteopenic. The soft tissues are unremarkable. Vascular calcifications noted. IMPRESSION: Mildly displaced and impacted right femoral neck fracture. Electronically Signed   By: Anner Crete M.D.   On: 09/25/2021 02:55     Subjective: Patient seen examined bedside, resting comfortably.  Sleeping but easily arousable.  Confused.  Discharging to [facility] present for hospitalist.  Discharge Exam: Vitals:   10/21/21 1933 10/22/21 0528  BP: (!) 166/59 99/61  Pulse: 88 66  Resp: 18 16  Temp: 98.1 F (36.7 C) 97.7 F (36.5 C)  SpO2: 97% 96%   Vitals:   10/20/21 1206 10/21/21 0500 10/21/21 1933 10/22/21 0528  BP: 116/68 (!) 145/84 (!) 166/59 99/61  Pulse: 71 71 88 66  Resp: 20 18 18 16   Temp: 97.7 F (36.5 C) 98.1 F (36.7 C) 98.1 F (36.7 C) 97.7 F (36.5 C)  TempSrc:  Axillary Oral Oral  SpO2: 98% 97% 97% 96%  Weight:      Height:        General: Pt is alert, awake, not in acute distress, pleasantly confused Cardiovascular: RRR, S1/S2 +, no rubs, no gallops Respiratory: CTA bilaterally, no wheezing, no rhonchi on room air Abdominal: Soft, NT, ND, bowel sounds + Extremities: Right lower  extremity shortened, internally rotated, pain with active/passive range of motion    The results of significant diagnostics from this hospitalization (including imaging, microbiology, ancillary and laboratory) are listed below for reference.     Microbiology: Recent Results (from the past 240 hour(s))  Resp Panel by RT-PCR (Flu A&B, Covid) Nasopharyngeal Swab     Status: None   Collection Time: 10/18/21  1:23 PM   Specimen: Nasopharyngeal Swab; Nasopharyngeal(NP) swabs in vial transport medium  Result Value Ref Range Status   SARS Coronavirus 2 by RT PCR NEGATIVE NEGATIVE Final    Comment: (NOTE) SARS-CoV-2 target nucleic acids are NOT DETECTED.  The SARS-CoV-2 RNA is generally detectable in upper respiratory specimens during the acute phase of infection. The lowest concentration of SARS-CoV-2 viral copies this assay can detect is 138 copies/mL. A negative result does not preclude SARS-Cov-2 infection and should not be used as the sole basis for treatment or other patient management decisions. A negative result may occur with  improper specimen collection/handling, submission of specimen other than nasopharyngeal swab, presence of viral mutation(s) within the areas targeted by this assay, and inadequate number of viral copies(<138 copies/mL). A negative result must be combined with clinical observations, patient history, and epidemiological information. The expected result is Negative.  Fact  Sheet for Patients:  EntrepreneurPulse.com.au  Fact Sheet for Healthcare Providers:  IncredibleEmployment.be  This test is no t yet approved or cleared by the [place] FDA and  has been authorized for detection and/or diagnosis of SARS-CoV-2 by FDA under an Emergency Use Authorization (EUA). This EUA will remain  in effect (meaning this test can be used) for the duration of the COVID-19 declaration under Section 564(b)(1) of the Act,  21 U.S.C.section 360bbb-3(b)(1), unless the authorization is terminated  or revoked sooner.       Influenza A by PCR NEGATIVE NEGATIVE Final   Influenza B by PCR NEGATIVE NEGATIVE Final    Comment: (NOTE) The Xpert Xpress SARS-CoV-2/FLU/RSV plus assay is intended as an aid in the diagnosis of influenza from Nasopharyngeal swab specimens and should not be used as a sole basis for treatment. Nasal washings and aspirates are unacceptable for Xpert Xpress SARS-CoV-2/FLU/RSV testing.  Fact Sheet for Patients: EntrepreneurPulse.com.au  Fact Sheet for Healthcare Providers: IncredibleEmployment.be  This test is not yet approved or cleared by the [place] FDA and has been authorized for detection and/or diagnosis of SARS-CoV-2 by FDA under an Emergency Use Authorization (EUA). This EUA will remain in effect (meaning this test can be used) for the duration of the COVID-19 declaration under Section 564(b)(1) of the Act, 21 U.S.C. section 360bbb-3(b)(1), unless the authorization is terminated or revoked.  Performed at [facility], [place] [address], [place], [place] [address]      Labs: BNP (last 3 results) No results for input(s): BNP in the last 8760 hours. Basic Metabolic Panel: Recent Labs  Lab 10/18/21 1323 10/19/21 0500 10/20/21 0436  NA 135 137 135  K 3.5 3.8 3.8  CL 103 107 104  CO2 26 25 23   GLUCOSE 107* 91 88  BUN 15 11 10   CREATININE 0.31* 0.36* <0.30*  CALCIUM 8.3* 7.8* 8.2*  MG  --  2.0  --   PHOS  --  3.0  --    Liver Function Tests: Recent Labs  Lab 10/18/21 1323  AST 21  ALT 15  ALKPHOS 157*  BILITOT 0.3  PROT 7.2  ALBUMIN 3.2*   No results for input(s): LIPASE, AMYLASE in the last 168 hours. No results for input(s): AMMONIA in the last 168 hours. CBC: Recent Labs  Lab 10/18/21 1323 10/19/21 0500 10/20/21 0436  WBC 11.1* 6.6 6.3  NEUTROABS 8.7*  --   --   HGB 9.6* 8.8* 9.9*  HCT  30.3* 29.0* 31.2*  MCV 98.1 100.3* 94.8  PLT 356 299 244   Cardiac Enzymes: No results for input(s): CKTOTAL, CKMB, CKMBINDEX, TROPONINI in the last 168 hours. BNP: Invalid input(s): POCBNP CBG: No results for input(s): GLUCAP in the last 168 hours. D-Dimer No results for input(s): DDIMER in the last 72 hours. Hgb A1c No results for input(s): HGBA1C in the last 72 hours. Lipid Profile No results for input(s): CHOL, HDL, LDLCALC, TRIG, CHOLHDL, LDLDIRECT in the last 72 hours. Thyroid function studies No results for input(s): TSH, T4TOTAL, T3FREE, THYROIDAB in the last 72 hours.  Invalid input(s): FREET3 Anemia work up Recent Labs    10/19/21 1444  VITAMINB12 630  FOLATE 11.2  FERRITIN 219  TIBC 237*  IRON 58   Urinalysis    Component Value Date/Time   COLORURINE AMBER (A) 10/18/2021 2056   APPEARANCEUR CLOUDY (A) 10/18/2021 2056   LABSPEC 1.026 10/18/2021 2056   PHURINE 5.0 10/18/2021 2056   GLUCOSEU NEGATIVE 10/18/2021 2056   HGBUR  SMALL (A) 10/18/2021 2056   BILIRUBINUR NEGATIVE 10/18/2021 2056   KETONESUR 5 (A) 10/18/2021 2056   PROTEINUR 30 (A) 10/18/2021 2056   NITRITE NEGATIVE 10/18/2021 2056   LEUKOCYTESUR NEGATIVE 10/18/2021 2056   Sepsis Labs Invalid input(s): PROCALCITONIN,  WBC,  LACTICIDVEN Microbiology Recent Results (from the past 240 hour(s))  Resp Panel by RT-PCR (Flu A&B, Covid) Nasopharyngeal Swab     Status: None   Collection Time: 10/18/21  1:23 PM   Specimen: Nasopharyngeal Swab; Nasopharyngeal(NP) swabs in vial transport medium  Result Value Ref Range Status   SARS Coronavirus 2 by RT PCR NEGATIVE NEGATIVE Final    Comment: (NOTE) SARS-CoV-2 target nucleic acids are NOT DETECTED.  The SARS-CoV-2 RNA is generally detectable in upper respiratory specimens during the acute phase of infection. The lowest concentration of SARS-CoV-2 viral copies this assay can detect is 138 copies/mL. A negative result does not preclude SARS-Cov-2 infection  and should not be used as the sole basis for treatment or other patient management decisions. A negative result may occur with  improper specimen collection/handling, submission of specimen other than nasopharyngeal swab, presence of viral mutation(s) within the areas targeted by this assay, and inadequate number of viral copies(<138 copies/mL). A negative result must be combined with clinical observations, patient history, and epidemiological information. The expected result is Negative.  Fact Sheet for Patients:  EntrepreneurPulse.com.au  Fact Sheet for Healthcare Providers:  IncredibleEmployment.be  This test is no t yet approved or cleared by the [place] FDA and  has been authorized for detection and/or diagnosis of SARS-CoV-2 by FDA under an Emergency Use Authorization (EUA). This EUA will remain  in effect (meaning this test can be used) for the duration of the COVID-19 declaration under Section 564(b)(1) of the Act, 21 U.S.C.section 360bbb-3(b)(1), unless the authorization is terminated  or revoked sooner.       Influenza A by PCR NEGATIVE NEGATIVE Final   Influenza B by PCR NEGATIVE NEGATIVE Final    Comment: (NOTE) The Xpert Xpress SARS-CoV-2/FLU/RSV plus assay is intended as an aid in the diagnosis of influenza from Nasopharyngeal swab specimens and should not be used as a sole basis for treatment. Nasal washings and aspirates are unacceptable for Xpert Xpress SARS-CoV-2/FLU/RSV testing.  Fact Sheet for Patients: EntrepreneurPulse.com.au  Fact Sheet for Healthcare Providers: IncredibleEmployment.be  This test is not yet approved or cleared by the [place] FDA and has been authorized for detection and/or diagnosis of SARS-CoV-2 by FDA under an Emergency Use Authorization (EUA). This EUA will remain in effect (meaning this test can be used) for the duration of the COVID-19 declaration  under Section 564(b)(1) of the Act, 21 U.S.C. section 360bbb-3(b)(1), unless the authorization is terminated or revoked.  Performed at [facility], [place] [address] [place], [place] [address]      Time coordinating discharge: Over 30 minutes  SIGNED:   Donnamarie Poag [place] DO  [facility] 10/22/2021, 12:15 PM

## 2021-10-22 NOTE — TOC Transition Note (Addendum)
Transition of Care [facility]) - CM/SW Discharge Note   Patient Details  Name: Linda Copeland MRN: [id number] Date of Birth: Jan 22, 1931  Transition of Care [facility]) CM/SW Contact:  Ross Ludwig, LCSW Phone Number: 10/22/2021, 4:33 PM   Clinical Narrative:     CSW was informed that [facility] has a bed available for today.   Patient to be d/c'ed today to [facility] facility. Patient and family agreeable to plans will transport via ems RN to call report to [phone].     Final next level of care: [place] Barriers to Discharge: Barriers Resolved   Patient Goals and CMS Choice Patient states their goals for this hospitalization and ongoing recovery are:: To go to [facility] facility for end of life care. CMS Medicare.gov Compare Post Acute Care list provided to:: Patient Represenative (must comment) Choice offered to / list presented to : Adult Children  Discharge Placement              Patient chooses bed at: Other - please specify in the comment section below: ([place]) Patient to be transferred to facility by: [facility] Name of family member notified: Patient's son [name] Patient and family notified of of transfer: 10/22/21  Discharge Plan and Services In-house Referral: Clinical Social Work   Post Acute Care Choice: Residential Hospice Bed          DME Arranged: N/A DME Agency: NA                  Social Determinants of Health ([place]) Interventions     Readmission Risk Interventions No flowsheet data found.

## 2021-10-22 NOTE — Progress Notes (Addendum)
Engineer, maintenance [facility]) Hospital Liaison note.   Received request from [place] for family interest in [facility] with request for transfer today. Chart reviewed and eligibility confirmed.   Spoke to son [name]  to confirm interest and explain services. Family agreeable to transfer today. CSW aware.  Registration paperwork will be completed by [name].  Dr. Orpah Melter assume care per family request.    RN please call report to [phone]. Please arrange transport for patient once consents are signed.    Thank you,   Clementeen Hoof, BSN, RN [place] (listed on AMION under Hospice and [place] [facility] [place])   [phone]

## 2021-12-31 DEATH — deceased
